# Patient Record
Sex: Female | Born: 1965 | Race: White | Hispanic: No | Marital: Married | Smoking: Former smoker
Health system: Southern US, Community
[De-identification: ages and names within clinical notes are randomized; demographics above are authoritative.]

## PROBLEM LIST (undated history)

## (undated) DIAGNOSIS — M51369 Other intervertebral disc degeneration, lumbar region without mention of lumbar back pain or lower extremity pain: Secondary | ICD-10-CM

## (undated) DIAGNOSIS — G5602 Carpal tunnel syndrome, left upper limb: Secondary | ICD-10-CM

## (undated) DIAGNOSIS — M329 Systemic lupus erythematosus, unspecified: Secondary | ICD-10-CM

## (undated) DIAGNOSIS — M1990 Unspecified osteoarthritis, unspecified site: Secondary | ICD-10-CM

## (undated) DIAGNOSIS — K754 Autoimmune hepatitis: Secondary | ICD-10-CM

## (undated) DIAGNOSIS — M069 Rheumatoid arthritis, unspecified: Secondary | ICD-10-CM

## (undated) DIAGNOSIS — F419 Anxiety disorder, unspecified: Secondary | ICD-10-CM

## (undated) DIAGNOSIS — F329 Major depressive disorder, single episode, unspecified: Secondary | ICD-10-CM

## (undated) DIAGNOSIS — R21 Rash and other nonspecific skin eruption: Secondary | ICD-10-CM

## (undated) DIAGNOSIS — F32A Depression, unspecified: Secondary | ICD-10-CM

## (undated) DIAGNOSIS — M797 Fibromyalgia: Secondary | ICD-10-CM

## (undated) DIAGNOSIS — M5136 Other intervertebral disc degeneration, lumbar region: Secondary | ICD-10-CM

## (undated) DIAGNOSIS — C50919 Malignant neoplasm of unspecified site of unspecified female breast: Secondary | ICD-10-CM

## (undated) DIAGNOSIS — I7300 Raynaud's syndrome without gangrene: Secondary | ICD-10-CM

## (undated) DIAGNOSIS — M3500 Sicca syndrome, unspecified: Secondary | ICD-10-CM

## (undated) HISTORY — DX: Raynaud's syndrome without gangrene: I73.00

## (undated) HISTORY — DX: Malignant neoplasm of unspecified site of unspecified female breast: C50.919

## (undated) HISTORY — DX: Sjogren syndrome, unspecified: M35.00

## (undated) HISTORY — PX: FOOT SURGERY: SHX648

---

## 1987-08-25 HISTORY — PX: CARPAL TUNNEL RELEASE: SHX101

## 1997-08-24 HISTORY — PX: VAGINAL HYSTERECTOMY: SUR661

## 1999-06-20 ENCOUNTER — Ambulatory Visit (HOSPITAL_BASED_OUTPATIENT_CLINIC_OR_DEPARTMENT_OTHER): Admission: RE | Admit: 1999-06-20 | Discharge: 1999-06-20 | Payer: Self-pay | Admitting: Orthopedic Surgery

## 2000-06-03 ENCOUNTER — Encounter: Payer: Self-pay | Admitting: Obstetrics and Gynecology

## 2000-06-03 ENCOUNTER — Encounter: Admission: RE | Admit: 2000-06-03 | Discharge: 2000-06-03 | Payer: Self-pay | Admitting: Obstetrics and Gynecology

## 2000-10-06 ENCOUNTER — Other Ambulatory Visit: Admission: RE | Admit: 2000-10-06 | Discharge: 2000-10-06 | Payer: Self-pay | Admitting: Gastroenterology

## 2000-10-06 ENCOUNTER — Encounter (INDEPENDENT_AMBULATORY_CARE_PROVIDER_SITE_OTHER): Payer: Self-pay | Admitting: Specialist

## 2001-06-07 ENCOUNTER — Encounter: Payer: Self-pay | Admitting: *Deleted

## 2001-06-07 ENCOUNTER — Encounter: Admission: RE | Admit: 2001-06-07 | Discharge: 2001-06-07 | Payer: Self-pay | Admitting: *Deleted

## 2001-11-22 ENCOUNTER — Other Ambulatory Visit: Admission: RE | Admit: 2001-11-22 | Discharge: 2001-11-22 | Payer: Self-pay | Admitting: Obstetrics and Gynecology

## 2002-11-30 ENCOUNTER — Other Ambulatory Visit: Admission: RE | Admit: 2002-11-30 | Discharge: 2002-11-30 | Payer: Self-pay | Admitting: Obstetrics and Gynecology

## 2003-07-16 ENCOUNTER — Encounter: Admission: RE | Admit: 2003-07-16 | Discharge: 2003-07-16 | Payer: Self-pay | Admitting: *Deleted

## 2003-12-09 ENCOUNTER — Encounter: Admission: RE | Admit: 2003-12-09 | Discharge: 2003-12-09 | Payer: Self-pay

## 2004-01-02 ENCOUNTER — Other Ambulatory Visit: Admission: RE | Admit: 2004-01-02 | Discharge: 2004-01-02 | Payer: Self-pay | Admitting: Obstetrics and Gynecology

## 2004-02-06 ENCOUNTER — Inpatient Hospital Stay (HOSPITAL_COMMUNITY): Admission: RE | Admit: 2004-02-06 | Discharge: 2004-02-07 | Payer: Self-pay | Admitting: Neurosurgery

## 2004-02-06 HISTORY — PX: ANTERIOR CERVICAL DECOMP/DISCECTOMY FUSION: SHX1161

## 2005-01-26 ENCOUNTER — Other Ambulatory Visit: Admission: RE | Admit: 2005-01-26 | Discharge: 2005-01-26 | Payer: Self-pay | Admitting: Obstetrics and Gynecology

## 2005-06-16 ENCOUNTER — Ambulatory Visit (HOSPITAL_COMMUNITY): Admission: RE | Admit: 2005-06-16 | Discharge: 2005-06-16 | Payer: Self-pay

## 2007-01-03 ENCOUNTER — Encounter: Admission: RE | Admit: 2007-01-03 | Discharge: 2007-01-03 | Payer: Self-pay | Admitting: Obstetrics and Gynecology

## 2010-04-22 ENCOUNTER — Encounter: Admission: RE | Admit: 2010-04-22 | Discharge: 2010-05-22 | Payer: Self-pay | Admitting: Family Medicine

## 2010-11-13 ENCOUNTER — Encounter: Payer: Self-pay | Admitting: Gastroenterology

## 2010-11-20 NOTE — Letter (Signed)
Summary: Colonoscopy Date Change Letter  [facility]  [address] [place], [place] [address]   Phone: [phone]  Fax: [phone]      November 13, 2010 MRN: [id number]   [facility] [address] [place], [place]  [address]   Dear [name],   Previously you were recommended to have a repeat colonoscopy around this time. Your chart was recently reviewed by Dr. Claudette Head of [facility]. Follow up colonoscopy is now recommended in February 2017. This revised recommendation is based on current, nationally recognized guidelines for colorectal cancer screening and polyp surveillance. These guidelines are endorsed by the American Cancer Society, [employer] on Colorectal Cancer as well as numerous other major medical organizations.  Please understand that our recommendation assumes that you do not have any new symptoms such as bleeding, a change in bowel habits, anemia, or significant abdominal discomfort. If you do have any concerning GI symptoms or want to discuss the guideline recommendations, please call to arrange an office visit at your earliest convenience. Otherwise we will keep you in our reminder system and contact you 1-2 months prior to the date listed above to schedule your next colonoscopy.  Thank you,  Judie Petit T. Russella Dar, M.D.  [facility] [phone]

## 2011-01-09 NOTE — H&P (Signed)
NAME:  Diana Schwartz, Diana Schwartz NO.:  [id number]   MEDICAL RECORD NO.:  [id number]                   PATIENT TYPE:  INP   LOCATION:  NA                                   FACILITY:  [facility]   PHYSICIAN:  Payton Doughty, M.D.                   DATE OF BIRTH:  1965-12-04   DATE OF ADMISSION:  02/06/2004  DATE OF DISCHARGE:                                HISTORY & PHYSICAL   ADMISSION DIAGNOSIS:  Spondylosis C5-6 and C6-7.   BODY OF TEXT:  This is a 45 year old right-handed white female noting over  the past couple of years to have increasing neck pain, the past few months  having increasing pain down the left shoulder and arm. MRI demonstrated  spondylosis at C5-6 and C6-7, she is referred to me. She is now admitted for  an anterior cervical decompression and fusion.   PAST MEDICAL HISTORY:  Remarkable for lupus. She is taking Plaquenil 200 mg  b.i.d., Prevacid 30 mg a day, Flexeril 10 mg a day and on a p.r.n. basis.   ALLERGIES:  She has no allergies.   PAST SURGICAL HISTORY:  Hysterectomy, carpal tunnel, lipectomy, lumpectomy  by Dr. Samuella Cota.   SOCIAL HISTORY:  She smokes a pack of cigarettes a day. She drinks alcohol  on a social basis. She is not working.   FAMILY HISTORY:  Mother is 21 and in fair health. She has emphysema and  stroke. Father is 57 and fair health. He has pancreatitis and lumbar  spondylosis.   REVIEW OF SYSTEMS:  Remarkable for joint pain, arthritis, neck pain,  anxiety, and depression.   PHYSICAL EXAMINATION:  HEENT:  Within normal limits. She has limitation of  range of motion in her neck turning her towards the left.  NECK: Examination of the neck causes pain down her left arm.  CHEST: A few crackles.  CARDIAC: Regular rate and rhythm.  ABDOMEN: Nontender with no hepatosplenomegaly .  EXTREMITIES: Without clubbing, cyanosis, or edema. Peripheral pulses are  good.  GU: Deferred.  NEUROLOGIC: She is awake, alert, and oriented. Cranial  nerves are intact.  Motor exam shows 5/5 strength throughout the upper extremities. Strength in  the left triceps is 4/5. There are dysesthesias she describes in her C6-7  distribution. Reflexes are 2 at the biceps, 1 at the triceps on the left and  2 on the right. Brachial radialis is 1 on both sides. She has a mildly  positive Hoffman's in the left lower extremity and nonmyelopathic.   MRI demonstrates spondylosis, posterior protruding osteophyte, and disk at 5-  6 and a left-sided foraminal narrowing at C6-7.   CLINICAL IMPRESSION:  Cervical spondylosis with myelopathy.   PLAN:  Anterior cervical decompression and fusion at C5-6 and C6-7. The  risks and benefits of this procedure have been discussed with her and she  wishes to proceed.  Payton Doughty, M.D.    MWR/MEDQ  D:  02/06/2004  T:  02/06/2004  Job:  [id number]

## 2011-01-09 NOTE — Op Note (Signed)
NAME:  Diana Schwartz, Diana Schwartz NO.:  [id number]   MEDICAL RECORD NO.:  [id number]                   PATIENT TYPE:  INP   LOCATION:  2872                                 FACILITY:  [facility]   PHYSICIAN:  Payton Doughty, M.D.                   DATE OF BIRTH:  11-Feb-1966   DATE OF PROCEDURE:  02/06/2004  DATE OF DISCHARGE:                                 OPERATIVE REPORT   PREOPERATIVE DIAGNOSIS:  Spondylosis, C5-6, C6-7.   POSTOPERATIVE DIAGNOSIS:  Spondylosis, C5-6, C6-7.   OPERATION PERFORMED:  C5-6, C6-7 anterior cervical decompression and fusion  with reflex hybrid plate.   SURGEON:  Payton Doughty, M.D.   NURSE ASSISTANT:  [facility].   ANESTHESIA:  General endotracheal.   PREP:  Sterile Betadine prep and scrub with alcohol wipe.   COMPLICATIONS:  None.   INDICATIONS FOR PROCEDURE:  The patient is a 45 year old female with left  cervical radiculopathy and cervical spondylosis C5-6 and C6-7.   DESCRIPTION OF PROCEDURE:  The patient was taken to the operating room,  smoothly anesthetized, intubated, and placed supine on the operating table.  Following shave, prep and drape in the usual sterile fashion, the skin was  incised from the midline to the medial border of the sternocleidomastoid  muscle at the level of the carotid tubercle.  The platysma was identified,  elevated, divided and undermined.  The sternocleidomastoid muscle was  identified.  Medial dissection revealed the carotid artery retracted  laterally to the left, trachea and esophagus retracted laterally to the  right.  This exposed the bones of the anterior cervical spine.  Marker was  placed and intraoperative x-ray obtained to confirm correctness of level.  Having confirmed correctness of level, the longus coli was taken down  bilaterally and Shadowline self-retaining retractor placed.  Diskectomy was  then carried out at C5-6 and C6-7 under gross observation.  The operating  microscope was  then brought in and we used microdissection technique to  remove the remaining disk, remove the posterior longitudinal ligament and  dissect the anterior epidural space and explore the neural foramina  bilaterally.  There was disk and spur on the left side at both 5-6 and 6-7.  They were about equally affected.  Following complete decompression, the  wound was irrigated and hemostasis assured.  7 mm bone grafts were fashioned  of patellar allograft and tapped into place.  A 32 mm Reflex hybrid plate  with 12 mm screws was placed.  Two screws in C5, two in C6 and two in C7.  Intraoperative x-ray showed good placement of bone graft, plate and screws.  The wound was irrigated and hemostasis assured.  The platysma was  reapproximated with 3-0 Vicryl in interrupted fashion, subcutaneous tissue  was reapproximated with 3-0 Vicryl in interrupted fashion.  Skin was closed with 4-0 Vicryl in running subcuticular fashion.  Benzoin  and Steri-Strips were placed  and made occlusive with Telfa and OpSite. The  patient was placed in Aspen collar and returned to recovery room in good  condition.                                               Payton Doughty, M.D.    MWR/MEDQ  D:  02/06/2004  T:  02/06/2004  Job:  [id number]

## 2011-08-25 DIAGNOSIS — C50919 Malignant neoplasm of unspecified site of unspecified female breast: Secondary | ICD-10-CM

## 2011-08-25 HISTORY — DX: Malignant neoplasm of unspecified site of unspecified female breast: C50.919

## 2011-12-25 ENCOUNTER — Other Ambulatory Visit: Payer: Self-pay | Admitting: Gastroenterology

## 2011-12-25 DIAGNOSIS — R7989 Other specified abnormal findings of blood chemistry: Secondary | ICD-10-CM

## 2011-12-30 ENCOUNTER — Ambulatory Visit
Admission: RE | Admit: 2011-12-30 | Discharge: 2011-12-30 | Disposition: A | Discharge status: Home / Self Care | Payer: 59 | Source: Ambulatory Visit | Attending: Gastroenterology | Admitting: Gastroenterology

## 2011-12-30 DIAGNOSIS — R7989 Other specified abnormal findings of blood chemistry: Secondary | ICD-10-CM

## 2012-04-18 ENCOUNTER — Other Ambulatory Visit: Payer: Self-pay | Admitting: Obstetrics and Gynecology

## 2012-04-18 DIAGNOSIS — N6489 Other specified disorders of breast: Secondary | ICD-10-CM

## 2012-04-18 DIAGNOSIS — N644 Mastodynia: Secondary | ICD-10-CM

## 2012-04-19 ENCOUNTER — Ambulatory Visit
Admission: RE | Admit: 2012-04-19 | Discharge: 2012-04-19 | Disposition: A | Discharge status: Home / Self Care | Payer: 59 | Source: Ambulatory Visit | Attending: Obstetrics and Gynecology | Admitting: Obstetrics and Gynecology

## 2012-04-19 ENCOUNTER — Other Ambulatory Visit: Payer: Self-pay | Admitting: Obstetrics and Gynecology

## 2012-04-19 DIAGNOSIS — N644 Mastodynia: Secondary | ICD-10-CM

## 2012-04-19 DIAGNOSIS — N6489 Other specified disorders of breast: Secondary | ICD-10-CM

## 2012-08-12 HISTORY — PX: MASTECTOMY: SHX3

## 2012-08-24 HISTORY — PX: AXILLARY NODE DISSECTION: SHX1211

## 2013-06-24 HISTORY — PX: BREAST RECONSTRUCTION: SHX9

## 2013-09-25 ENCOUNTER — Encounter (HOSPITAL_BASED_OUTPATIENT_CLINIC_OR_DEPARTMENT_OTHER): Payer: Self-pay | Admitting: *Deleted

## 2013-09-26 ENCOUNTER — Encounter (HOSPITAL_BASED_OUTPATIENT_CLINIC_OR_DEPARTMENT_OTHER): Payer: Self-pay | Admitting: *Deleted

## 2013-09-27 ENCOUNTER — Encounter (HOSPITAL_BASED_OUTPATIENT_CLINIC_OR_DEPARTMENT_OTHER): Payer: Self-pay | Admitting: *Deleted

## 2013-09-28 ENCOUNTER — Encounter (HOSPITAL_BASED_OUTPATIENT_CLINIC_OR_DEPARTMENT_OTHER): Payer: Self-pay | Admitting: *Deleted

## 2013-09-28 NOTE — Progress Notes (Signed)
NPO AFTER MN. ARRIVE AT 1100. NEEDS HG. WILL TAKE AM MEDS W/ SIPS OF WATER.

## 2013-10-02 ENCOUNTER — Ambulatory Visit (HOSPITAL_BASED_OUTPATIENT_CLINIC_OR_DEPARTMENT_OTHER): Payer: 59 | Admitting: Anesthesiology

## 2013-10-02 ENCOUNTER — Ambulatory Visit (HOSPITAL_BASED_OUTPATIENT_CLINIC_OR_DEPARTMENT_OTHER)
Admission: RE | Admit: 2013-10-02 | Discharge: 2013-10-02 | Disposition: A | Discharge status: Home / Self Care | Payer: 59 | Source: Ambulatory Visit | Attending: Orthopedic Surgery | Admitting: Orthopedic Surgery

## 2013-10-02 ENCOUNTER — Encounter (HOSPITAL_BASED_OUTPATIENT_CLINIC_OR_DEPARTMENT_OTHER): Payer: Self-pay | Admitting: Anesthesiology

## 2013-10-02 ENCOUNTER — Encounter (HOSPITAL_BASED_OUTPATIENT_CLINIC_OR_DEPARTMENT_OTHER)
Admission: RE | Disposition: A | Discharge status: Home / Self Care | Payer: Self-pay | Source: Ambulatory Visit | Attending: Orthopedic Surgery

## 2013-10-02 ENCOUNTER — Encounter (HOSPITAL_BASED_OUTPATIENT_CLINIC_OR_DEPARTMENT_OTHER): Payer: 59 | Admitting: Anesthesiology

## 2013-10-02 DIAGNOSIS — Z885 Allergy status to narcotic agent status: Secondary | ICD-10-CM | POA: Insufficient documentation

## 2013-10-02 DIAGNOSIS — Z9010 Acquired absence of unspecified breast and nipple: Secondary | ICD-10-CM | POA: Insufficient documentation

## 2013-10-02 DIAGNOSIS — K754 Autoimmune hepatitis: Secondary | ICD-10-CM | POA: Insufficient documentation

## 2013-10-02 DIAGNOSIS — IMO0001 Reserved for inherently not codable concepts without codable children: Secondary | ICD-10-CM | POA: Insufficient documentation

## 2013-10-02 DIAGNOSIS — Z79899 Other long term (current) drug therapy: Secondary | ICD-10-CM | POA: Insufficient documentation

## 2013-10-02 DIAGNOSIS — G5600 Carpal tunnel syndrome, unspecified upper limb: Secondary | ICD-10-CM | POA: Insufficient documentation

## 2013-10-02 DIAGNOSIS — Z90710 Acquired absence of both cervix and uterus: Secondary | ICD-10-CM | POA: Insufficient documentation

## 2013-10-02 DIAGNOSIS — Z888 Allergy status to other drugs, medicaments and biological substances status: Secondary | ICD-10-CM | POA: Insufficient documentation

## 2013-10-02 DIAGNOSIS — M069 Rheumatoid arthritis, unspecified: Secondary | ICD-10-CM | POA: Insufficient documentation

## 2013-10-02 DIAGNOSIS — G5602 Carpal tunnel syndrome, left upper limb: Secondary | ICD-10-CM

## 2013-10-02 DIAGNOSIS — Z853 Personal history of malignant neoplasm of breast: Secondary | ICD-10-CM | POA: Insufficient documentation

## 2013-10-02 DIAGNOSIS — M329 Systemic lupus erythematosus, unspecified: Secondary | ICD-10-CM | POA: Insufficient documentation

## 2013-10-02 HISTORY — DX: Depression, unspecified: F32.A

## 2013-10-02 HISTORY — DX: Autoimmune hepatitis: K75.4

## 2013-10-02 HISTORY — DX: Rheumatoid arthritis, unspecified: M06.9

## 2013-10-02 HISTORY — DX: Unspecified osteoarthritis, unspecified site: M19.90

## 2013-10-02 HISTORY — DX: Other intervertebral disc degeneration, lumbar region without mention of lumbar back pain or lower extremity pain: M51.369

## 2013-10-02 HISTORY — PX: CARPAL TUNNEL RELEASE: SHX101

## 2013-10-02 HISTORY — DX: Carpal tunnel syndrome, left upper limb: G56.02

## 2013-10-02 HISTORY — DX: Other intervertebral disc degeneration, lumbar region: M51.36

## 2013-10-02 HISTORY — DX: Rash and other nonspecific skin eruption: R21

## 2013-10-02 HISTORY — DX: Systemic lupus erythematosus, unspecified: M32.9

## 2013-10-02 HISTORY — DX: Fibromyalgia: M79.7

## 2013-10-02 HISTORY — DX: Major depressive disorder, single episode, unspecified: F32.9

## 2013-10-02 LAB — POCT HEMOGLOBIN-HEMACUE: Hemoglobin: 14.4 g/dL (ref 12.0–15.0)

## 2013-10-02 SURGERY — CARPAL TUNNEL RELEASE
Anesthesia: Monitor Anesthesia Care | Site: Wrist | Laterality: Left

## 2013-10-02 SURGERY — CARPAL TUNNEL RELEASE
Anesthesia: Monitor Anesthesia Care | Laterality: Left

## 2013-10-02 MED ORDER — FENTANYL CITRATE 0.05 MG/ML IJ SOLN
INTRAMUSCULAR | Status: DC | PRN
  Administered 2013-10-02 (×2): 25 ug via INTRAVENOUS
  Administered 2013-10-02: 50 ug via INTRAVENOUS

## 2013-10-02 MED ORDER — FENTANYL CITRATE 0.05 MG/ML IJ SOLN
INTRAMUSCULAR | Status: AC
  Filled 2013-10-02: qty 2

## 2013-10-02 MED ORDER — LIDOCAINE HCL (CARDIAC) 20 MG/ML IV SOLN
INTRAVENOUS | Status: DC | PRN
  Administered 2013-10-02: 50 mg via INTRAVENOUS

## 2013-10-02 MED ORDER — ONDANSETRON HCL 4 MG/2ML IJ SOLN
INTRAMUSCULAR | Status: DC | PRN
Start: 2013-10-02 — End: 2013-10-02
  Administered 2013-10-02: 4 mg via INTRAVENOUS

## 2013-10-02 MED ORDER — SODIUM CHLORIDE 0.9 % IR SOLN
Status: DC | PRN
  Administered 2013-10-02: 500 mL

## 2013-10-02 MED ORDER — PROPOFOL 10 MG/ML IV EMUL
INTRAVENOUS | Status: DC | PRN
  Administered 2013-10-02: 100 ug/kg/min via INTRAVENOUS

## 2013-10-02 MED ORDER — CHLORHEXIDINE GLUCONATE 4 % EX LIQD
60.0000 mL | Freq: Once | CUTANEOUS | Status: DC
  Filled 2013-10-02: qty 60

## 2013-10-02 MED ORDER — CEFAZOLIN SODIUM-DEXTROSE 2-3 GM-% IV SOLR
2.0000 g | INTRAVENOUS | Status: AC
  Administered 2013-10-02: 2 g via INTRAVENOUS
  Filled 2013-10-02: qty 50

## 2013-10-02 MED ORDER — MIDAZOLAM HCL 2 MG/2ML IJ SOLN
INTRAMUSCULAR | Status: AC
  Filled 2013-10-02: qty 2

## 2013-10-02 MED ORDER — HYDROCODONE-ACETAMINOPHEN 5-300 MG PO TABS: 1.0000 | ORAL_TABLET | Freq: Four times a day (QID) | ORAL | Status: DC | PRN

## 2013-10-02 MED ORDER — MIDAZOLAM HCL 5 MG/5ML IJ SOLN
INTRAMUSCULAR | Status: DC | PRN
  Administered 2013-10-02: 2 mg via INTRAVENOUS

## 2013-10-02 MED ORDER — DOCUSATE SODIUM 100 MG PO CAPS: 100.0000 mg | ORAL_CAPSULE | Freq: Two times a day (BID) | ORAL | Status: DC

## 2013-10-02 MED ORDER — PROMETHAZINE HCL 25 MG/ML IJ SOLN
6.2500 mg | INTRAMUSCULAR | Status: DC | PRN
  Filled 2013-10-02: qty 1

## 2013-10-02 MED ORDER — FENTANYL CITRATE 0.05 MG/ML IJ SOLN
25.0000 ug | INTRAMUSCULAR | Status: DC | PRN
  Filled 2013-10-02: qty 1

## 2013-10-02 MED ORDER — LACTATED RINGERS IV SOLN
INTRAVENOUS | Status: DC
  Administered 2013-10-02: 12:00:00 via INTRAVENOUS
  Filled 2013-10-02: qty 1000

## 2013-10-02 SURGICAL SUPPLY — 51 items
BANDAGE ELASTIC 3 VELCRO ST LF (GAUZE/BANDAGES/DRESSINGS) ×2 IMPLANT
BANDAGE GAUZE ELAST BULKY 4 IN (GAUZE/BANDAGES/DRESSINGS) ×2 IMPLANT
BLADE SURG 15 STRL LF DISP TIS (BLADE) ×1 IMPLANT
BLADE SURG 15 STRL SS (BLADE) ×1
BNDG COHESIVE 4X5 TAN NS LF (GAUZE/BANDAGES/DRESSINGS) ×2 IMPLANT
BNDG ESMARK 4X9 LF (GAUZE/BANDAGES/DRESSINGS) ×2 IMPLANT
BNDG GAUZE ELAST 4 BULKY (GAUZE/BANDAGES/DRESSINGS) ×2 IMPLANT
CORDS BIPOLAR (ELECTRODE) ×2 IMPLANT
COVER MAYO STAND STRL (DRAPES) IMPLANT
COVER TABLE BACK 60X90 (DRAPES) ×2 IMPLANT
CUFF TOURNIQUET SINGLE 18IN (TOURNIQUET CUFF) ×2 IMPLANT
DRAPE EXTREMITY T 121X128X90 (DRAPE) ×2 IMPLANT
DRAPE SURG 17X23 STRL (DRAPES) ×2 IMPLANT
DRSG EMULSION OIL 3X3 NADH (GAUZE/BANDAGES/DRESSINGS) ×2 IMPLANT
GAUZE SPONGE 4X4 16PLY XRAY LF (GAUZE/BANDAGES/DRESSINGS) IMPLANT
GAUZE XEROFORM 1X8 LF (GAUZE/BANDAGES/DRESSINGS) ×2 IMPLANT
GLOVE BIO SURGEON STRL SZ7 (GLOVE) ×2 IMPLANT
GLOVE BIO SURGEON STRL SZ8 (GLOVE) ×2 IMPLANT
GLOVE BIOGEL PI IND STRL 7.5 (GLOVE) ×1 IMPLANT
GLOVE BIOGEL PI IND STRL 8.5 (GLOVE) ×1 IMPLANT
GLOVE BIOGEL PI INDICATOR 7.5 (GLOVE) ×1
GLOVE BIOGEL PI INDICATOR 8.5 (GLOVE) ×1
GLOVE INDICATOR 7.5 STRL GRN (GLOVE) ×6 IMPLANT
GOWN STRL REUS W/ TWL LRG LVL3 (GOWN DISPOSABLE) IMPLANT
GOWN STRL REUS W/ TWL XL LVL3 (GOWN DISPOSABLE) ×2 IMPLANT
GOWN STRL REUS W/TWL LRG LVL3 (GOWN DISPOSABLE)
GOWN STRL REUS W/TWL XL LVL3 (GOWN DISPOSABLE) ×2
KNIFE CARPAL TUNNEL (BLADE) ×2 IMPLANT
LOOP VESSEL MAXI BLUE (MISCELLANEOUS) IMPLANT
NDL SAFETY ECLIPSE 18X1.5 (NEEDLE) ×2 IMPLANT
NEEDLE HYPO 18GX1.5 SHARP (NEEDLE) ×2
NEEDLE HYPO 25X1 1.5 SAFETY (NEEDLE) ×4 IMPLANT
NS IRRIG 500ML POUR BTL (IV SOLUTION) ×2 IMPLANT
PACK BASIN DAY SURGERY FS (CUSTOM PROCEDURE TRAY) ×2 IMPLANT
PAD ALCOHOL SWAB (MISCELLANEOUS) ×8 IMPLANT
PAD CAST 3X4 CTTN HI CHSV (CAST SUPPLIES) ×1 IMPLANT
PADDING CAST ABS 3INX4YD NS (CAST SUPPLIES)
PADDING CAST ABS 4INX4YD NS (CAST SUPPLIES) ×1
PADDING CAST ABS COTTON 3X4 (CAST SUPPLIES) IMPLANT
PADDING CAST ABS COTTON 4X4 ST (CAST SUPPLIES) ×1 IMPLANT
PADDING CAST COTTON 3X4 STRL (CAST SUPPLIES) ×1
SPONGE GAUZE 4X4 12PLY (GAUZE/BANDAGES/DRESSINGS) ×2 IMPLANT
SPONGE GAUZE 4X4 12PLY STER LF (GAUZE/BANDAGES/DRESSINGS) ×2 IMPLANT
STOCKINETTE 4X48 STRL (DRAPES) ×2 IMPLANT
STOCKINETTE SYNTHETIC 3 UNSTER (CAST SUPPLIES) IMPLANT
SUT PROLENE 4 0 PS 2 18 (SUTURE) ×2 IMPLANT
SYR BULB 3OZ (MISCELLANEOUS) ×2 IMPLANT
SYR CONTROL 10ML LL (SYRINGE) ×4 IMPLANT
TOWEL OR 17X24 6PK STRL BLUE (TOWEL DISPOSABLE) ×2 IMPLANT
TRAY DSU PREP LF (CUSTOM PROCEDURE TRAY) ×2 IMPLANT
UNDERPAD 30X30 INCONTINENT (UNDERPADS AND DIAPERS) ×2 IMPLANT

## 2013-10-02 NOTE — H&P (Signed)
Diana Schwartz is an 48 y.o. female.   Chief Complaint: left hand numbness HPI: pt followed in office Pt with s/s c/w left hand carpal tunnel syndrome Pt elects surgery on left hand Pt with h/o of breast cancer and node dissection on the left arm +emg/ncv studies  Past Medical History  Diagnosis Date  . History of breast cancer NO RECURRENCE    DX 2013  LEFT BREAST CANCER--  S/P BILATERAL MASTECTOMY AND LEFT AXILLA LYMPH NODE DISSECTOMY/  NO CHEMORADIATION  . Osteoarthritis   . Rheumatoid arthritis   . Degenerative disc disease, lumbar   . Autoimmune hepatitis     no current med.  . Rash     arms, chest - due to lupus  . Depression   . Fibromyalgia   . Carpal tunnel syndrome of left wrist   . Lupus (systemic lupus erythematosus)     Past Surgical History  Procedure Laterality Date  . Anterior cervical decomp/discectomy fusion  02/06/2004    C5-6, C6-7  . Breast reconstruction  06/2013  . Carpal tunnel release Right 1989  . Axillary node dissection Left 08/2012  . Vaginal hysterectomy  1999  . Mastectomy Bilateral 08/12/2012    LEFT BREAST CANCER    History reviewed. No pertinent family history. Social History:  reports that she has never smoked. She has never used smokeless tobacco. She reports that she drinks alcohol. She reports that she does not use illicit drugs.  Allergies:  Allergies  Allergen Reactions  . Oxycodone Swelling    Whelps and swelling in throat  . Prednisone Nausea And Vomiting    Can take IV steroids    Medications Prior to Admission  Medication Sig Dispense Refill  . hydroxychloroquine (PLAQUENIL) 200 MG tablet Take 200 mg by mouth 2 (two) times daily.      . tamoxifen (NOLVADEX) 20 MG tablet Take 20 mg by mouth every morning.       . venlafaxine (EFFEXOR) 100 MG tablet Take 225 mg by mouth every morning.         Results for orders placed during the hospital encounter of 10/02/13 (from the past 48 hour(s))  POCT HEMOGLOBIN-HEMACUE      Status: None   Collection Time    10/02/13 11:43 AM      Result Value Range   Hemoglobin 14.4  12.0 - 15.0 g/dL   No results found.  ROS  No recent illnesses or hospitalizations   Blood pressure 103/69, pulse 90, temperature 99.1 F (37.3 C), temperature source Oral, resp. rate 16, height 5\' 2"  (1.575 m), weight 58.968 kg (130 lb), SpO2 100.00%. Physical Exam  General Appearance:  Alert, cooperative, no distress, appears stated age  Head:  Normocephalic, without obvious abnormality, atraumatic  Eyes:  Pupils equal, conjunctiva/corneas clear,         Throat: Lips, mucosa, and tongue normal; teeth and gums normal  Neck: No visible masses     Lungs:   respirations unlabored  Chest Wall:  No tenderness or deformity  Heart:  Regular rate and rhythm,  Abdomen:   Soft, non-tender,         Extremities: Left hand: fingers warm well perfused Good cap refil Good digital motion Full composite flexion and wrist motion  Pulses: 2+ and symmetric  Skin: Skin color, texture, turgor normal, no rashes or lesions     Neurologic: Normal    Assessment/Plan Left hand carpal tunnel syndrome  Left hand carpal tunnel release  R/B/A DISCUSSED WITH PT  IN OFFICE.  PT VOICED UNDERSTANDING OF PLAN CONSENT SIGNED DAY OF SURGERY PT SEEN AND EXAMINED PRIOR TO OPERATIVE PROCEDURE/DAY OF SURGERY SITE MARKED. QUESTIONS ANSWERED WILL GO HOME FOLLOWING SURGERY  Linna Hoff 10/02/2013, 12:14 PM

## 2013-10-02 NOTE — Discharge Instructions (Signed)
KEEP BANDAGE CLEAN AND DRY CALL OFFICE FOR F/U APPT [phone]  DR [facility] CELL PHONE [phone] KEEP HAND ELEVATED ABOVE HEART OK TO APPLY ICE TO OPERATIVE AREA CONTACT OFFICE IF ANY WORSENING PAIN OR CONCERNS. Post Anesthesia Home Care Instructions  Activity: Get plenty of rest for the remainder of the day. A responsible adult should stay with you for 24 hours following the procedure.  For the next 24 hours, DO NOT: -Drive a car -Paediatric nurse -Drink alcoholic beverages -Take any medication unless instructed by your physician -Make any legal decisions or sign important papers.  Meals: Start with liquid foods such as gelatin or soup. Progress to regular foods as tolerated. Avoid greasy, spicy, heavy foods. If nausea and/or vomiting occur, drink only clear liquids until the nausea and/or vomiting subsides. Call your physician if vomiting continues.  Special Instructions/Symptoms: Your throat may feel dry or sore from the anesthesia or the breathing tube placed in your throat during surgery. If this causes discomfort, gargle with warm salt water. The discomfort should disappear within 24 hours. Information for Discharge Teaching: EXPAREL (bupivacaine liposome injectable suspension)   Your surgeon gave you EXPAREL(bupivacaine) in your surgical incision to help control your pain after surgery.   EXPAREL is a local anesthetic that provides pain relief by numbing the tissue around the surgical site.  EXPAREL is designed to release pain medication over time and can control pain for up to 72 hours.  Depending on how you respond to EXPAREL, you may require less pain medication during your recovery.  Possible side effects:  Temporary loss of sensation or ability to move in the area where bupivacaine was injected.  Nausea, vomiting, constipation  Rarely, numbness and tingling in your mouth or lips, lightheadedness, or anxiety may occur.  Call your doctor right away if you think you may  be experiencing any of these sensations, or if you have other questions regarding possible side effects.  Follow all other discharge instructions given to you by your surgeon or nurse. Eat a healthy diet and drink plenty of water or other fluids.  If you return to the hospital for any reason within 96 hours following the administration of EXPAREL, please inform your health care providers.      HAND SURGERY    HOME CARE INSTRUCTIONS    The following instructions have been prepared to help you care for yourself upon your return home today.  Wound Care:  Keep your hand elevated above the level of your heart. Do not allow it to dangle by your side. Keep the dressing dry and do not remove it unless your doctor advises you to do so. He will usually change it at the time of you post-op visit. Moving your fingers is advised to stimulate circulation but will depend on the site of your surgery. Of course, if you have a splint applied your doctor will advise you about movement.  Activity:  Do not drive or operate machinery today. Rest today and then you may return to your normal activity and work as indicated by your physician.  Diet: Drink liquids today or eat a light diet. You may resume a regular diet tomorrow.  General expectations: Pain for two or three days. Fingers may become slightly swollen.   Unexpected Observations- Call your doctor if any of these occur: Severe pain not relieved by pain medication. Elevated temperature. Dressing soaked with blood. Inability to move fingers. White or bluish color to fingers.  Return to office: ***

## 2013-10-02 NOTE — Brief Op Note (Signed)
10/02/2013  12:16 PM  PATIENT:  Carollee Sires Shetterly  48 y.o. female  PRE-OPERATIVE DIAGNOSIS:  left hand carpal tunnel syndrome  POST-OPERATIVE DIAGNOSIS:  * No post-op diagnosis entered *  PROCEDURE:  Procedure(s) with comments: LEFT CARPAL TUNNEL RELEASE (Left) - Local Anesthesia with IV Sedation  SURGEON:  Surgeon(s) and Role:    * Linna Hoff, MD - Primary  PHYSICIAN ASSISTANT:   ASSISTANTS: none   ANESTHESIA:   MAC  EBL:     BLOOD ADMINISTERED:none  DRAINS: none   LOCAL MEDICATIONS USED:  MARCAINE     SPECIMEN:  No Specimen  DISPOSITION OF SPECIMEN:  N/A  COUNTS:  YES  TOURNIQUET:  Less than 15 minutes  [id number]  PLAN OF CARE: Discharge to home after PACU  PATIENT DISPOSITION:  PACU - hemodynamically stable.   Delay start of Pharmacological VTE agent (>24hrs) due to surgical blood loss or risk of bleeding: not applicable

## 2013-10-02 NOTE — Transfer of Care (Signed)
Immediate Anesthesia Transfer of Care Note  Patient: Diana Schwartz  Procedure(s) Performed: Procedure(s) (LRB): LEFT CARPAL TUNNEL RELEASE (Left)  Patient Location: PACU  Anesthesia Type:MAC  Level of Consciousness: awake, alert  and oriented  Airway & Oxygen Therapy: Patient Spontanous Breathing and Patient connected to face mask oxygen  Post-op Assessment: Report given to PACU RN and Post -op Vital signs reviewed and stable  Post vital signs: Reviewed and stable  Complications: No apparent anesthesia complications

## 2013-10-02 NOTE — Anesthesia Postprocedure Evaluation (Signed)
  Anesthesia Post-op Note  Patient: Diana Schwartz  Procedure(s) Performed: Procedure(s) (LRB): LEFT CARPAL TUNNEL RELEASE (Left)  Patient Location: PACU  Anesthesia Type: MAC  Level of Consciousness: awake and alert   Airway and Oxygen Therapy: Patient Spontanous Breathing  Post-op Pain: mild  Post-op Assessment: Post-op Vital signs reviewed, Patient's Cardiovascular Status Stable, Respiratory Function Stable, Patent Airway and No signs of Nausea or vomiting  Last Vitals:  Filed Vitals:   10/02/13 1410  BP: 111/65  Pulse: 67  Temp: 36.1 C  Resp: 18    Post-op Vital Signs: stable   Complications: No apparent anesthesia complications

## 2013-10-02 NOTE — Anesthesia Procedure Notes (Signed)
Procedure Name: MAC Performed by: Mechele Claude Pre-anesthesia Checklist: Patient identified, Timeout performed, Emergency Drugs available, Suction available and Patient being monitored Patient Re-evaluated:Patient Re-evaluated prior to inductionOxygen Delivery Method: Nasal cannula Intubation Type: IV induction Placement Confirmation: positive ETCO2 and breath sounds checked- equal and bilateral

## 2013-10-02 NOTE — Anesthesia Preprocedure Evaluation (Signed)
Anesthesia Evaluation  Patient identified by MRN, date of birth, ID band Patient awake    Reviewed: Allergy & Precautions, H&P , NPO status , Patient's Chart, lab work & pertinent test results  Airway Mallampati: II TM Distance: >3 FB Neck ROM: Full    Dental no notable dental hx.    Pulmonary neg pulmonary ROS,  breath sounds clear to auscultation  Pulmonary exam normal       Cardiovascular Exercise Tolerance: Good negative cardio ROS  Rhythm:Regular Rate:Normal     Neuro/Psych PSYCHIATRIC DISORDERS Depression  Neuromuscular disease    GI/Hepatic negative GI ROS, (+) Hepatitis -, Autoimmune  Endo/Other  negative endocrine ROS  Renal/GU negative Renal ROS  negative genitourinary   Musculoskeletal  (+) Arthritis -, Rheumatoid disorders,  Fibromyalgia -  Abdominal   Peds negative pediatric ROS (+)  Hematology negative hematology ROS (+)   Anesthesia Other Findings   Reproductive/Obstetrics negative OB ROS                           Anesthesia Physical Anesthesia Plan  ASA: II  Anesthesia Plan: MAC   Post-op Pain Management:    Induction: Intravenous  Airway Management Planned:   Additional Equipment:   Intra-op Plan:   Post-operative Plan:   Informed Consent: I have reviewed the patients History and Physical, chart, labs and discussed the procedure including the risks, benefits and alternatives for the proposed anesthesia with the patient or authorized representative who has indicated his/her understanding and acceptance.   Dental advisory given  Plan Discussed with: CRNA  Anesthesia Plan Comments:         Anesthesia Quick Evaluation

## 2013-10-03 ENCOUNTER — Encounter (HOSPITAL_BASED_OUTPATIENT_CLINIC_OR_DEPARTMENT_OTHER): Payer: Self-pay | Admitting: Orthopedic Surgery

## 2013-10-03 NOTE — Op Note (Signed)
Diana Schwartz, Diana Schwartz             ACCOUNT NO.:  [id number]  MEDICAL RECORD NO.:  [id number]  LOCATION:                                 FACILITY:  PHYSICIAN:  Melrose Nakayama, MD  DATE OF BIRTH:  01/28/1966  DATE OF PROCEDURE:  10/02/2013 DATE OF DISCHARGE:  10/02/2013                              OPERATIVE REPORT   PREOPERATIVE DIAGNOSIS:  Left hand carpal tunnel syndrome.  POSTOPERATIVE DIAGNOSIS:  Left hand carpal tunnel syndrome.  SURGICAL PROCEDURE:  Left hand carpal tunnel release.  ATTENDING PHYSICIAN:  Linna Hoff IV, MD, who scrubbed and present for the entire procedure.  ASSISTANT SURGEON:  None.  ANESTHESIA:  Xylocaine 1%, 0.25% Marcaine and local block with IV sedation.  SURGICAL INDICATIONS:  Mrs. [place] Pink is a right-hand-dominant female with signs and symptoms consistent with left hand carpal tunnel syndrome.  The patient elected to undergo the above procedure.  Risks, benefits, and alternatives were discussed in detail with the patient and signed informed consent was obtained.  Risks include, but are not limited to bleeding, infection, damage to nearby nerves, arteries, or tendons, loss of motion of the wrist and digits, incomplete relief of symptoms, and need for further surgical intervention.  DESCRIPTION OF PROCEDURE:  The patient was properly identified in the preoperative holding area and marked with a permanent marker made on the left hand to indicate the correct operative site.  The patient was then brought back to the operating room, placed supine on the anesthesia room table where the IV sedation was administered.  A well-padded tourniquet was placed on the left forearm and sealed with 1000-drape.  Local anesthetic was administered.  Left upper extremity was then prepped and draped in normal sterile fashion.  Time-out was called.  The correct site was identified and procedure was then begun.  Attention was then turned to left hand where  several cm incision made directly in the mid palm.  Dissection was then carried down through the skin and subcutaneous tissue.  The palmar fascia was incised longitudinally under direct visualization, distal one-half of the transverse carpal ligament was released in its entirety.  Further exposure was then carried out proximally.  The remaining portion of the transverse carpal ligament as well as antebrachial fascia were then released.  Careful wound irrigation done throughout.  The wound was then thoroughly irrigated. The tourniquet was then deflated.  There was good hemostasis following tourniquet deflation.  The nerve had good flow through it following release.  It did appear to have the hourglass-type shape to the median nerve during the release.  The wound was then irrigated.  Following this, the skin was then closed using horizontal mattress Prolene sutures.  Adaptic dressing and sterile compressive bandage were then applied.  The patient tolerated the procedure well, returned to the recovery room in good condition.  POSTPROCEDURAL PLAN:  The patient was discharged to home, seen back in the office in approximately 12 days for wound check, suture removal, and begin a postoperative carpal tunnel eval and treat this.     Melrose Nakayama, MD     FWO/MEDQ  D:  10/02/2013  T:  10/03/2013  Job:  [id number]

## 2014-06-09 ENCOUNTER — Telehealth: Payer: Self-pay

## 2014-06-09 NOTE — Telephone Encounter (Signed)
error 

## 2014-09-30 ENCOUNTER — Encounter (HOSPITAL_COMMUNITY): Payer: Self-pay | Admitting: Emergency Medicine

## 2014-09-30 ENCOUNTER — Emergency Department (HOSPITAL_COMMUNITY)
Admission: EM | Admit: 2014-09-30 | Discharge: 2014-09-30 | Disposition: A | Discharge status: Home / Self Care | Payer: 59 | Attending: Emergency Medicine | Admitting: Emergency Medicine

## 2014-09-30 DIAGNOSIS — Z79899 Other long term (current) drug therapy: Secondary | ICD-10-CM | POA: Insufficient documentation

## 2014-09-30 DIAGNOSIS — Z8669 Personal history of other diseases of the nervous system and sense organs: Secondary | ICD-10-CM | POA: Insufficient documentation

## 2014-09-30 DIAGNOSIS — F329 Major depressive disorder, single episode, unspecified: Secondary | ICD-10-CM | POA: Diagnosis not present

## 2014-09-30 DIAGNOSIS — D8989 Other specified disorders involving the immune mechanism, not elsewhere classified: Secondary | ICD-10-CM | POA: Diagnosis not present

## 2014-09-30 DIAGNOSIS — M549 Dorsalgia, unspecified: Secondary | ICD-10-CM | POA: Diagnosis present

## 2014-09-30 DIAGNOSIS — M797 Fibromyalgia: Secondary | ICD-10-CM | POA: Insufficient documentation

## 2014-09-30 DIAGNOSIS — M545 Low back pain: Secondary | ICD-10-CM | POA: Diagnosis not present

## 2014-09-30 DIAGNOSIS — M069 Rheumatoid arthritis, unspecified: Secondary | ICD-10-CM | POA: Diagnosis not present

## 2014-09-30 DIAGNOSIS — Z8709 Personal history of other diseases of the respiratory system: Secondary | ICD-10-CM | POA: Insufficient documentation

## 2014-09-30 DIAGNOSIS — M1990 Unspecified osteoarthritis, unspecified site: Secondary | ICD-10-CM | POA: Insufficient documentation

## 2014-09-30 DIAGNOSIS — Z853 Personal history of malignant neoplasm of breast: Secondary | ICD-10-CM | POA: Diagnosis not present

## 2014-09-30 LAB — URINALYSIS, ROUTINE W REFLEX MICROSCOPIC
Bilirubin Urine: NEGATIVE
Glucose, UA: NEGATIVE mg/dL
Hgb urine dipstick: NEGATIVE
KETONES UR: NEGATIVE mg/dL
Leukocytes, UA: NEGATIVE
Nitrite: NEGATIVE
PH: 6 (ref 5.0–8.0)
Protein, ur: NEGATIVE mg/dL
SPECIFIC GRAVITY, URINE: 1.015 (ref 1.005–1.030)
Urobilinogen, UA: 0.2 mg/dL (ref 0.0–1.0)

## 2014-09-30 MED ORDER — HYDROCODONE-ACETAMINOPHEN 5-325 MG PO TABS
1.0000 | ORAL_TABLET | Freq: Once | ORAL | Status: AC
Start: 2014-09-30 — End: 2014-09-30
  Administered 2014-09-30: 1 via ORAL
  Filled 2014-09-30: qty 1

## 2014-09-30 MED ORDER — METHOCARBAMOL 500 MG PO TABS: 500.0000 mg | ORAL_TABLET | Freq: Two times a day (BID) | ORAL | Status: DC

## 2014-09-30 MED ORDER — HYDROMORPHONE HCL 1 MG/ML IJ SOLN
1.0000 mg | Freq: Once | INTRAMUSCULAR | Status: AC
  Administered 2014-09-30: 1 mg via INTRAVENOUS
  Filled 2014-09-30: qty 1

## 2014-09-30 MED ORDER — DIAZEPAM 5 MG/ML IJ SOLN
10.0000 mg | Freq: Once | INTRAMUSCULAR | Status: AC
  Administered 2014-09-30: 10 mg via INTRAVENOUS
  Filled 2014-09-30: qty 2

## 2014-09-30 MED ORDER — IBUPROFEN 400 MG PO TABS: 400.0000 mg | ORAL_TABLET | Freq: Four times a day (QID) | ORAL | Status: DC | PRN

## 2014-09-30 MED ORDER — KETOROLAC TROMETHAMINE 30 MG/ML IJ SOLN
30.0000 mg | Freq: Once | INTRAMUSCULAR | Status: AC
  Administered 2014-09-30: 30 mg via INTRAVENOUS
  Filled 2014-09-30: qty 1

## 2014-09-30 MED ORDER — PREDNISONE 50 MG PO TABS: 50.0000 mg | ORAL_TABLET | Freq: Every day | ORAL | Status: DC

## 2014-09-30 MED ORDER — DEXAMETHASONE SODIUM PHOSPHATE 10 MG/ML IJ SOLN
10.0000 mg | Freq: Once | INTRAMUSCULAR | Status: AC
  Administered 2014-09-30: 10 mg via INTRAVENOUS
  Filled 2014-09-30: qty 1

## 2014-09-30 NOTE — ED Notes (Signed)
Patient ambulating with assistance to restroom to obtain urine specimen

## 2014-09-30 NOTE — ED Notes (Signed)
Bed: FB90 Expected date: 09/30/14 Expected time: 12:30 PM Means of arrival: Ambulance Comments: Low back pain

## 2014-09-30 NOTE — Discharge Instructions (Signed)
Take the meds prescribed. Continue taking the norco you take at home. See the Rheumatologist soon. Return to the ER if the pain is getting worse, there is new numbness, weakness, urinary incontinence, urinary retention, bowel incontinence, tingling around the groin region.   Lupus Lupus (also called systemic lupus erythematosus, SLE) is a disorder of the body's natural defense system (immune system). In lupus, the immune system attacks various areas of the body (autoimmune disease). CAUSES The cause is unknown. However, lupus runs in families. Certain genes can make you more likely to develop lupus. It is 10 times more common in women than in men. Lupus is also more common in African Americans and Asians. Other factors also play a role, such as viruses (Epstein-Barr virus, EBV), stress, hormones, cigarette smoke, and certain drugs. SYMPTOMS Lupus can affect many parts of the body, including the joints, skin, kidneys, lungs, heart, nervous system, and blood vessels. The signs and symptoms of lupus differ from person to person. The disease can range from mild to life-threatening. Typical features of lupus include:  Butterfly-shaped rash over the face.  Arthritis involving one or more joints.  Kidney disease.  Fever, weight loss, hair loss, fatigue.  Poor circulation in the fingers and toes (Raynaud's disease).  Chest pain when taking deep breaths. Abdominal pain may also occur.  Skin rash in areas exposed to the sun.  Sores in the mouth and nose. DIAGNOSIS Diagnosing lupus can take a long time and is often difficult. An exam and an accurate account of your symptoms and health problems is very important. Blood tests are necessary, though no single test can confirm or rule out lupus. Most people with lupus test positive for antinuclear antibodies (ANA) on a blood test. Additional blood tests, a urine test (urinalysis), and sometimes a kidney or skin tissue sample (biopsy) can help to confirm  or rule out lupus. TREATMENT There is no cure for lupus. Your caregiver will develop a treatment plan based on your age, sex, health, symptoms, and lifestyle. The goals are to prevent flares, to treat them when they do occur, and to minimize organ damage and complications. How the disease may affect each person varies widely. Most people with lupus can live normal lives, but this disorder must be carefully monitored. Treatment must be adjusted as necessary to prevent serious complications. Medicines used for treatment:  Nonsteroidal anti-inflammatory drugs (NSAIDs) decrease inflammation and can help with chest pain, joint pain, and fevers. Examples include ibuprofen and naproxen.  Antimalarial drugs were designed to treat malaria. They also treat fatigue, joint pain, skin rashes, and inflammation of the lungs in patients with lupus.  Corticosteroids are powerful hormones that rapidly suppress inflammation. The lowest dose with the highest benefit will be chosen. They can be given by cream, pills, injections, and through the vein (intravenously).  Immunosuppressive drugs block the making of immune cells. They may be used for kidney or nerve disease. HOME CARE INSTRUCTIONS  Exercise. Low-impact activities can usually help keep joints flexible without being too strenuous.  Rest after periods of exercise.  Avoid excessive sun exposure.  Follow proper nutrition and take supplements as recommended by your caregiver.  Stress management can be helpful. SEEK MEDICAL CARE IF:  You have increased fatigue.  You develop pain.  You develop a rash.  You have an oral temperature above 102 F (38.9 C).  You develop abdominal discomfort.  You develop a headache.  You experience dizziness. [place] of Neurological Disorders and Stroke: MasterBoxes.it  [employer] of Rheumatology: www.rheumatology.[facility] of Arthritis and Musculoskeletal and  Skin Diseases: www.niams.SouthExposed.es Document Released: 07/31/2002 Document Revised: 11/02/2011 Document Reviewed: 11/21/2009 [facility] Patient Information 2015 [place], [place]. This information is not intended to replace advice given to you by your health care provider. Make sure you discuss any questions you have with your health care provider. Sjogren Syndrome Sjogren syndrome is a disease in which the body's natural defense system (immune system) turns against the body's own cells and attacks the glands that produce tears and saliva. Sjogren syndrome is sometimes linked to rheumatic disorders, such as rheumatoid arthritis and lupus. It is 10 times more common in women than in men. Most people with Sjogren syndrome are from 64 to 35 years of age. CAUSES  The cause is unknown. It runs in families. It is possible that a trigger, such as a viral infection, can set it off. SYMPTOMS  The main symptoms are:  Dry mouth.  Chalky feeling, mouth feels like it is full of cotton.  Difficulty swallowing, speaking, or tasting.  Prone to cavities and mouth infections.  Dry eyes.  Burning, itching, feels like sand in the eyes.  Blurry vision.  Light sensitive. Other symptoms may include:  Skin, nose, and vaginal dryness.  Joint pain, stiffness, and muscle pain. Other organs that may be affected include:  Kidneys.  Blood vessels.  Lungs.  Liver.  Pancreas.  Brain. DIAGNOSIS  Diagnosis is first based on symptoms, medical history, and physical exam. Tests may also be done, including:  Tear production test (Schirmer test).  A thorough eye exam using a magnifying device (slit-lamp exam).  Test to see the extent of eye damage using dye staining.  Mouth exam to look for signs of salivary gland swelling and mouth dryness.  Removal of a minor salivary gland from inside the lower lip to be studied under a microscope (lip biopsy).  Blood tests. Routine and special blood studies will be  checked. Antibodies that attack normal tissue (autoantibodies) may be present, such as antinuclear antibodies (ANAs), rheumatoid factors, and Sjogren antibodies (anti-SSA and anti-SSB).  Chest X-ray.  Urine tests (urinalysis). TREATMENT  There is no known cure for this syndrome. There is no specific treatment to restore gland secretion, either. Treatment varies and is generally based upon the problems present.  Moisture replacement therapies may ease the symptoms of dryness.  Nonsteroidal anti-inflammatory drugs (NSAIDs), such as ibuprofen, may be used to treat musculoskeletal symptoms.  Corticosteroids, such as prednisone, may be given for people with severe complications.  Immunosuppressive drugs may be prescribed to control overactivity of the immune system. In severe cases, this overactivity can lead to organ damage.  A surgical procedure called punctal occlusion may be considered. This is done to close the tear ducts, helping to keep more natural tears on the eye's surface. A small percentage of people with Sjogren syndrome develop lymphoma. If you are worried that you might develop lymphoma, talk to your caregiver to learn more about the disease and the symptoms to watch. HOME CARE INSTRUCTIONS   Eye care:  Use eye drops as specified by your caregiver.  Try to blink 5 to 6 times per minute.  Protect your eyes from drafts and breezes.  Maintain properly humidified air.  Avoid smoke.  Mouth care:  Sugar-free gum and hard candy can help in some people.  Take frequent sips of water or sugar-free drinks.  Use lip balm, saliva substitutes, and prescription medicines as directed. SEEK MEDICAL CARE IF:  You have an oral temperature above 102 F (38.9 C).  You have night sweats.  You develop constant fatigue.  You have unexplained weight loss.  You develop itchy skin.  You have reddened patches on the skin. FOR MORE INFORMATION  Sjogren's Syndrome Foundation:  www.sjogrens.[facility] of Arthritis and Musculoskeletal and Skin Diseases: www.niams.SouthExposed.es Document Released: 07/31/2002 Document Revised: 12/25/2013 Document Reviewed: 12/16/2009 [facility] Patient Information 2015 [place], [place]. This information is not intended to replace advice given to you by your health care provider. Make sure you discuss any questions you have with your health care provider.

## 2014-09-30 NOTE — ED Notes (Addendum)
Per EMS, Pt c/o low back pain x 2 hours, starting at 10 am this morning. Pt has hx of fibromyalgia and lupus. Pt sts she was experiencing some "twinges" then the pain became severe, constant and unbearable. Pt took 2 hydrocodone at 10:30 am and pain went from 10 to an 8. Pt believes she is having muscle spasms. A&Ox4.

## 2014-10-02 NOTE — ED Provider Notes (Signed)
CSN: [id number]     Arrival date & time 09/30/14  1242 History   First MD Initiated Contact with Patient 09/30/14 1335     Chief Complaint  Patient presents with  . Back Pain     (Consider location/radiation/quality/duration/timing/severity/associated sxs/prior Treatment) HPI Comments: PT comes in to the ER with severe back pain. Pt has hx of sjorgens, SLE, Raynaud's, fibromyalgia. She has hx of back pain, but the current pain is severe. PT started this AM, sudden onset. Pain is located in the back, and is worse with laying flat, and with walking. No associated numbness, weakness, urinary incontinence, urinary retention, bowel incontinence, saddle anesthesia. Pt is not on any immunomodulators or steroids for her autoimmune conditions. No trauma.    Patient is a 49 y.o. female presenting with back pain. The history is provided by the patient and a relative.  Back Pain Associated symptoms: numbness   Associated symptoms: no abdominal pain, no chest pain, no dysuria and no headaches     Past Medical History  Diagnosis Date  . History of breast cancer NO RECURRENCE    DX 2013  LEFT BREAST CANCER--  S/P BILATERAL MASTECTOMY AND LEFT AXILLA LYMPH NODE DISSECTOMY/  NO CHEMORADIATION  . Osteoarthritis   . Rheumatoid arthritis   . Degenerative disc disease, lumbar   . Autoimmune hepatitis     no current med.  . Rash     arms, chest - due to lupus  . Depression   . Fibromyalgia   . Carpal tunnel syndrome of left wrist   . Lupus (systemic lupus erythematosus)    Past Surgical History  Procedure Laterality Date  . Anterior cervical decomp/discectomy fusion  02/06/2004    C5-6, C6-7  . Breast reconstruction  06/2013  . Carpal tunnel release Right 1989  . Axillary node dissection Left 08/2012  . Vaginal hysterectomy  1999  . Mastectomy Bilateral 08/12/2012    LEFT BREAST CANCER  . Carpal tunnel release Left 10/02/2013    Procedure: LEFT CARPAL TUNNEL RELEASE;  Surgeon: Linna Hoff,  MD;  Location: [facility];  Service: Orthopedics;  Laterality: Left;  Local Anesthesia with IV Sedation   No family history on file. History  Substance Use Topics  . Smoking status: Never Smoker   . Smokeless tobacco: Never Used  . Alcohol Use: Yes     Comment: socially   OB History    No data available     Review of Systems  Constitutional: Positive for activity change.  Respiratory: Negative for shortness of breath.   Cardiovascular: Negative for chest pain.  Gastrointestinal: Negative for nausea, vomiting and abdominal pain.  Genitourinary: Negative for dysuria, flank pain and decreased urine volume.  Musculoskeletal: Positive for back pain. Negative for neck pain.  Neurological: Positive for numbness. Negative for headaches.      Allergies  Oxycodone and Prednisone  Home Medications   Prior to Admission medications   Medication Sig Start Date End Date Taking? Authorizing Provider  azaTHIOprine (IMURAN) 50 MG tablet Take 50 mg by mouth 2 (two) times daily.   Yes Historical Provider, MD  HYDROcodone-acetaminophen (NORCO/VICODIN) 5-325 MG per tablet Take 1 tablet by mouth every 6 (six) hours as needed for severe pain.   Yes Historical Provider, MD  hydroxychloroquine (PLAQUENIL) 200 MG tablet Take 200 mg by mouth 2 (two) times daily.   Yes Historical Provider, MD  tamoxifen (NOLVADEX) 20 MG tablet Take 20 mg by mouth every morning.    Yes Historical  Provider, MD  traMADol (ULTRAM) 50 MG tablet Take 50 mg by mouth every 6 (six) hours as needed for moderate pain.   Yes Historical Provider, MD  venlafaxine (EFFEXOR) 75 MG tablet Take 75 mg by mouth 3 (three) times daily with meals.   Yes Historical Provider, MD  docusate sodium (COLACE) 100 MG capsule Take 1 capsule (100 mg total) by mouth 2 (two) times daily. Patient not taking: Reported on 09/30/2014 10/02/13   Linna Hoff, MD  Hydrocodone-Acetaminophen (VICODIN) 5-300 MG TABS Take 1 tablet by mouth 4 (four)  times daily as needed (PAIN). Patient not taking: Reported on 09/30/2014 10/02/13   Linna Hoff, MD  ibuprofen (ADVIL,MOTRIN) 400 MG tablet Take 1 tablet (400 mg total) by mouth every 6 (six) hours as needed. 09/30/14   Varney Biles, MD  methocarbamol (ROBAXIN) 500 MG tablet Take 1 tablet (500 mg total) by mouth 2 (two) times daily. 09/30/14   Varney Biles, MD  predniSONE (DELTASONE) 50 MG tablet Take 1 tablet (50 mg total) by mouth daily. 09/30/14   Maira Christon Kathrynn Humble, MD   BP 96/53 mmHg  Pulse 68  Temp(Src) 98.2 F (36.8 C) (Oral)  Resp 18  SpO2 100% Physical Exam  Constitutional: She is oriented to person, place, and time. She appears well-developed and well-nourished.  HENT:  Head: Normocephalic and atraumatic.  Eyes: EOM are normal. Pupils are equal, round, and reactive to light.  Neck: Neck supple.  Cardiovascular: Normal rate, regular rhythm and normal heart sounds.   No murmur heard. Pulmonary/Chest: Effort normal. No respiratory distress.  Abdominal: Soft. She exhibits no distension. There is no tenderness. There is no rebound and no guarding.  Musculoskeletal:  Pt has tenderness over the lumbar region No step offs, no erythema. Pt has 2+ patellar reflex bilaterally. Able to discriminate between sharp and dull. Able to ambulate (with assistance).   Neurological: She is alert and oriented to person, place, and time.  Skin: Skin is warm and dry.  Nursing note and vitals reviewed.   ED Course  Procedures (including critical care time) Labs Review Labs Reviewed  URINALYSIS, ROUTINE W REFLEX MICROSCOPIC - Abnormal; Notable for the following:    APPearance CLOUDY (*)    All other components within normal limits    Imaging Review No results found.   EKG Interpretation None      MDM   Final diagnoses:  Severe back pain  Inflammatory autoimmune disorder    PT with severe back pain. She has hx of autoimmune condition and connective tissue dz. No hx of similar back  pain. She has no red flags on history or exam to suggest a spinal cord involvement, or even radicular symptoms. It is highly likely that she is having a flair up of one of her autoimmune condition.  Pt given iv analegsics, muscle relaxants and antiinflammatory. Pt reassessed. Pain improved, and tolerable. She has norco at home, we will start short course of antiinflammatory. Pt advised to see her Rheumatologist soon. Return precautions to the ER discussed.   Varney Biles, MD 10/02/14 1114

## 2014-11-26 ENCOUNTER — Other Ambulatory Visit: Payer: Self-pay | Admitting: Obstetrics and Gynecology

## 2014-11-27 LAB — CYTOLOGY - PAP

## 2015-05-10 ENCOUNTER — Other Ambulatory Visit: Payer: Self-pay | Admitting: Neurosurgery

## 2015-05-22 ENCOUNTER — Encounter (HOSPITAL_COMMUNITY)
Admission: RE | Admit: 2015-05-22 | Discharge: 2015-05-22 | Disposition: A | Discharge status: Home / Self Care | Payer: 59 | Source: Ambulatory Visit | Attending: Neurosurgery | Admitting: Neurosurgery

## 2015-05-22 ENCOUNTER — Encounter (HOSPITAL_COMMUNITY): Payer: Self-pay

## 2015-05-22 DIAGNOSIS — M4726 Other spondylosis with radiculopathy, lumbar region: Secondary | ICD-10-CM | POA: Diagnosis not present

## 2015-05-22 DIAGNOSIS — Z01812 Encounter for preprocedural laboratory examination: Secondary | ICD-10-CM | POA: Diagnosis not present

## 2015-05-22 DIAGNOSIS — Z0183 Encounter for blood typing: Secondary | ICD-10-CM | POA: Diagnosis not present

## 2015-05-22 HISTORY — DX: Anxiety disorder, unspecified: F41.9

## 2015-05-22 LAB — BASIC METABOLIC PANEL
Anion gap: 6 (ref 5–15)
BUN: 8 mg/dL (ref 6–20)
CHLORIDE: 105 mmol/L (ref 101–111)
CO2: 27 mmol/L (ref 22–32)
CREATININE: 0.73 mg/dL (ref 0.44–1.00)
Calcium: 9.1 mg/dL (ref 8.9–10.3)
GFR calc Af Amer: >60 mL/min (ref >60)
GFR calc non Af Amer: >60 mL/min (ref >60)
GLUCOSE: 92 mg/dL (ref 65–99)
Potassium: 4.3 mmol/L (ref 3.5–5.1)
SODIUM: 138 mmol/L (ref 135–145)

## 2015-05-22 LAB — CBC
HCT: 41.1 % (ref 36.0–46.0)
HEMOGLOBIN: 14 g/dL (ref 12.0–15.0)
MCH: 35.9 pg — AB (ref 26.0–34.0)
MCHC: 34.1 g/dL (ref 30.0–36.0)
MCV: 105.4 fL — AB (ref 78.0–100.0)
Platelets: 206 10*3/uL (ref 150–400)
RBC: 3.9 MIL/uL (ref 3.87–5.11)
RDW: 13.3 % (ref 11.5–15.5)
WBC: 3.9 10*3/uL — ABNORMAL LOW (ref 4.0–10.5)

## 2015-05-22 LAB — SURGICAL PCR SCREEN
MRSA, PCR: NEGATIVE
Staphylococcus aureus: NEGATIVE

## 2015-05-22 LAB — TYPE AND SCREEN
ABO/RH(D): O POS
Antibody Screen: NEGATIVE

## 2015-05-22 LAB — ABO/RH: ABO/RH(D): O POS

## 2015-05-22 NOTE — Pre-Procedure Instructions (Signed)
Diana Schwartz  05/22/2015      [id number] [place] [address] [place] [address]Phone: [phone]: [phone]   Your procedure is scheduled on  Oct 6.  [facility]  Call this number if you have problems the morning of surgery:  [phone]   Remember:  Do not eat food or drink liquids after midnight.  Take these medicines the morning of surgery with A SIP OF WATER Tamoxifen (Nolvadex), Venlafaxine XR (Effexor-XR)  Stop taking Aspirin, Ibuprofen, BC's, Goody's, Herbal medications, Fish Oil, ALeve   Do not wear jewelry, make-up or nail polish.  Do not wear lotions, powders, or perfumes.  You may wear deodorant.  Do not shave 48 hours prior to surgery.  Men may shave face and neck.  Do not bring valuables to the hospital.  [facility] or [facility].  Contacts, dentures or bridgework may not be worn into surgery.  Leave your suitcase in the car.  After surgery it may be brought to your room.  For patients admitted to the hospital, discharge time will be determined by your treatment team.  Patients discharged the day of surgery will not be allowed to drive home.    Special instructions:  [facility] - Preparing for Surgery  Before surgery, you can play an important role.  Because skin is not sterile, your skin needs to be as free of germs as possible.  You can reduce the number of germs on you skin by washing with CHG (chlorahexidine gluconate) soap before surgery.  CHG is an antiseptic cleaner which kills germs and bonds with the skin to continue killing germs even after washing.  Please DO NOT use if you have an allergy to CHG or antibacterial soaps.  If your skin becomes reddened/irritated stop using the CHG and inform your nurse when you arrive at Short Stay.  Do not shave (including legs and underarms) for at least 48 hours prior to the first  CHG shower.  You may shave your face.  Please follow these instructions carefully:   1.  Shower with CHG Soap the night before surgery and the   morning of Surgery.  2.  If you choose to wash your hair, wash your hair first as usual with your   normal shampoo.  3.  After you shampoo, rinse your hair and body thoroughly to remove the  Shampoo.  4.  Use CHG as you would any other liquid soap.  You can apply chg directly to the skin and wash gently with scrungie or a clean washcloth.  5.  Apply the CHG Soap to your body ONLY FROM THE NECK DOWN.   Do not use on open wounds or open sores.  Avoid contact with your eyes,  ears, mouth and genitals (private parts).  Wash genitals (private parts) with your normal soap.  6.  Wash thoroughly, paying special attention to the area where your surgery  will be performed.  7.  Thoroughly rinse your body with warm water from the neck down.  8.  DO NOT shower/wash with your normal soap after using and rinsing off the CHG Soap.  9.  Pat yourself dry with a clean towel.            10.  Wear clean pajamas.            11.  Place clean sheets on  your bed the night of your first shower and do not sleep with pets.  Day of Surgery  Do not apply any lotions/deoderants the morning of surgery.  Please wear clean clothes to the hospital/surgery center.     Please read over the following fact sheets that you were given. Pain Booklet, Coughing and Deep Breathing, Blood Transfusion Information, MRSA Information and Surgical Site Infection Prevention

## 2015-05-22 NOTE — Progress Notes (Signed)
PCP is Dr Harrington Challenger Denies seeing a cardiologist. Denies ever having a card cath, stress test, or echo.

## 2015-05-25 HISTORY — PX: OTHER SURGICAL HISTORY: SHX169

## 2015-05-29 MED ORDER — CEFAZOLIN SODIUM-DEXTROSE 2-3 GM-% IV SOLR
2.0000 g | INTRAVENOUS | Status: AC
  Administered 2015-05-30: 2 g via INTRAVENOUS

## 2015-05-30 ENCOUNTER — Inpatient Hospital Stay (HOSPITAL_COMMUNITY): Payer: 59 | Admitting: Anesthesiology

## 2015-05-30 ENCOUNTER — Inpatient Hospital Stay (HOSPITAL_COMMUNITY)
Admission: RE | Admit: 2015-05-30 | Discharge: 2015-05-31 | DRG: 460 | Disposition: A | Discharge status: Home / Self Care | Payer: 59 | Source: Ambulatory Visit | Attending: Neurosurgery | Admitting: Neurosurgery

## 2015-05-30 ENCOUNTER — Encounter (HOSPITAL_COMMUNITY)
Admission: RE | Disposition: A | Discharge status: Home / Self Care | Payer: 59 | Source: Ambulatory Visit | Attending: Neurosurgery

## 2015-05-30 ENCOUNTER — Encounter (HOSPITAL_COMMUNITY): Payer: Self-pay | Admitting: *Deleted

## 2015-05-30 ENCOUNTER — Inpatient Hospital Stay (HOSPITAL_COMMUNITY): Payer: 59

## 2015-05-30 DIAGNOSIS — M47816 Spondylosis without myelopathy or radiculopathy, lumbar region: Secondary | ICD-10-CM | POA: Diagnosis present

## 2015-05-30 DIAGNOSIS — M797 Fibromyalgia: Secondary | ICD-10-CM | POA: Diagnosis present

## 2015-05-30 DIAGNOSIS — M5116 Intervertebral disc disorders with radiculopathy, lumbar region: Secondary | ICD-10-CM | POA: Diagnosis present

## 2015-05-30 DIAGNOSIS — Z853 Personal history of malignant neoplasm of breast: Secondary | ICD-10-CM | POA: Diagnosis not present

## 2015-05-30 DIAGNOSIS — K754 Autoimmune hepatitis: Secondary | ICD-10-CM | POA: Diagnosis present

## 2015-05-30 DIAGNOSIS — M532X6 Spinal instabilities, lumbar region: Secondary | ICD-10-CM | POA: Diagnosis present

## 2015-05-30 DIAGNOSIS — Z419 Encounter for procedure for purposes other than remedying health state, unspecified: Secondary | ICD-10-CM

## 2015-05-30 DIAGNOSIS — Z9013 Acquired absence of bilateral breasts and nipples: Secondary | ICD-10-CM | POA: Diagnosis not present

## 2015-05-30 DIAGNOSIS — M4726 Other spondylosis with radiculopathy, lumbar region: Principal | ICD-10-CM | POA: Diagnosis present

## 2015-05-30 DIAGNOSIS — Z885 Allergy status to narcotic agent status: Secondary | ICD-10-CM | POA: Diagnosis not present

## 2015-05-30 DIAGNOSIS — Z888 Allergy status to other drugs, medicaments and biological substances status: Secondary | ICD-10-CM | POA: Diagnosis not present

## 2015-05-30 DIAGNOSIS — M069 Rheumatoid arthritis, unspecified: Secondary | ICD-10-CM | POA: Diagnosis present

## 2015-05-30 DIAGNOSIS — M329 Systemic lupus erythematosus, unspecified: Secondary | ICD-10-CM | POA: Diagnosis present

## 2015-05-30 DIAGNOSIS — M545 Low back pain: Secondary | ICD-10-CM | POA: Diagnosis present

## 2015-05-30 DIAGNOSIS — Z90710 Acquired absence of both cervix and uterus: Secondary | ICD-10-CM

## 2015-05-30 SURGERY — POSTERIOR LUMBAR FUSION 1 LEVEL
Anesthesia: General | Site: Back

## 2015-05-30 MED ORDER — VENLAFAXINE HCL ER 75 MG PO CP24
225.0000 mg | ORAL_CAPSULE | Freq: Every day | ORAL | Status: DC
  Filled 2015-05-30: qty 1

## 2015-05-30 MED ORDER — EPHEDRINE SULFATE 50 MG/ML IJ SOLN
INTRAMUSCULAR | Status: AC
  Filled 2015-05-30: qty 1

## 2015-05-30 MED ORDER — MIDAZOLAM HCL 5 MG/5ML IJ SOLN
INTRAMUSCULAR | Status: DC | PRN
  Administered 2015-05-30: 2 mg via INTRAVENOUS

## 2015-05-30 MED ORDER — BISACODYL 10 MG RE SUPP: 10.0000 mg | Freq: Every day | RECTAL | Status: DC | PRN

## 2015-05-30 MED ORDER — PHENOL 1.4 % MT LIQD: 1.0000 | OROMUCOSAL | Status: DC | PRN

## 2015-05-30 MED ORDER — BACITRACIN 50000 UNITS IM SOLR
INTRAMUSCULAR | Status: DC | PRN
  Administered 2015-05-30: 14:00:00

## 2015-05-30 MED ORDER — KETOROLAC TROMETHAMINE 30 MG/ML IJ SOLN
30.0000 mg | Freq: Once | INTRAMUSCULAR | Status: AC
Start: 2015-05-30 — End: 2015-05-30
  Administered 2015-05-30: 30 mg via INTRAVENOUS

## 2015-05-30 MED ORDER — CEFAZOLIN SODIUM-DEXTROSE 2-3 GM-% IV SOLR
INTRAVENOUS | Status: AC
  Filled 2015-05-30: qty 50

## 2015-05-30 MED ORDER — MORPHINE SULFATE (PF) 4 MG/ML IV SOLN: 4.0000 mg | INTRAVENOUS | Status: DC | PRN

## 2015-05-30 MED ORDER — ACETAMINOPHEN 10 MG/ML IV SOLN
INTRAVENOUS | Status: AC
  Administered 2015-05-30: 1000 mg via INTRAVENOUS
  Filled 2015-05-30: qty 100

## 2015-05-30 MED ORDER — FENTANYL CITRATE (PF) 100 MCG/2ML IJ SOLN
INTRAMUSCULAR | Status: DC | PRN
  Administered 2015-05-30 (×4): 50 ug via INTRAVENOUS
  Administered 2015-05-30: 150 ug via INTRAVENOUS

## 2015-05-30 MED ORDER — FENTANYL CITRATE (PF) 250 MCG/5ML IJ SOLN
INTRAMUSCULAR | Status: AC
  Filled 2015-05-30: qty 5

## 2015-05-30 MED ORDER — TAMOXIFEN CITRATE 10 MG PO TABS
20.0000 mg | ORAL_TABLET | Freq: Every day | ORAL | Status: DC
  Filled 2015-05-30: qty 2

## 2015-05-30 MED ORDER — HYDROXYCHLOROQUINE SULFATE 200 MG PO TABS
200.0000 mg | ORAL_TABLET | Freq: Two times a day (BID) | ORAL | Status: DC
  Filled 2015-05-30 (×3): qty 1

## 2015-05-30 MED ORDER — HYDROXYZINE HCL 25 MG PO TABS: 50.0000 mg | ORAL_TABLET | ORAL | Status: DC | PRN

## 2015-05-30 MED ORDER — ACETAMINOPHEN 325 MG PO TABS: 650.0000 mg | ORAL_TABLET | ORAL | Status: DC | PRN

## 2015-05-30 MED ORDER — KETOROLAC TROMETHAMINE 30 MG/ML IJ SOLN
INTRAMUSCULAR | Status: AC
  Filled 2015-05-30: qty 1

## 2015-05-30 MED ORDER — GELATIN ABSORBABLE MT POWD
OROMUCOSAL | Status: DC | PRN
  Administered 2015-05-30: 13:00:00 via TOPICAL

## 2015-05-30 MED ORDER — HYDROXYZINE HCL 50 MG/ML IM SOLN
50.0000 mg | INTRAMUSCULAR | Status: DC | PRN
  Filled 2015-05-30: qty 1

## 2015-05-30 MED ORDER — SODIUM CHLORIDE 0.9 % IJ SOLN
INTRAMUSCULAR | Status: AC
  Filled 2015-05-30: qty 10

## 2015-05-30 MED ORDER — ALUM & MAG HYDROXIDE-SIMETH 200-200-20 MG/5ML PO SUSP: 30.0000 mL | Freq: Four times a day (QID) | ORAL | Status: DC | PRN

## 2015-05-30 MED ORDER — ARTIFICIAL TEARS OP OINT
TOPICAL_OINTMENT | OPHTHALMIC | Status: AC
  Filled 2015-05-30: qty 3.5

## 2015-05-30 MED ORDER — 0.9 % SODIUM CHLORIDE (POUR BTL) OPTIME
TOPICAL | Status: DC | PRN
  Administered 2015-05-30: 1000 mL

## 2015-05-30 MED ORDER — ONDANSETRON HCL 4 MG/2ML IJ SOLN
INTRAMUSCULAR | Status: DC | PRN
  Administered 2015-05-30: 4 mg via INTRAVENOUS

## 2015-05-30 MED ORDER — BUPIVACAINE HCL (PF) 0.5 % IJ SOLN
INTRAMUSCULAR | Status: DC | PRN
  Administered 2015-05-30: 10 mL

## 2015-05-30 MED ORDER — ONDANSETRON HCL 4 MG/2ML IJ SOLN: 4.0000 mg | Freq: Four times a day (QID) | INTRAMUSCULAR | Status: DC | PRN

## 2015-05-30 MED ORDER — HYDROMORPHONE HCL 1 MG/ML IJ SOLN
INTRAMUSCULAR | Status: AC
  Filled 2015-05-30: qty 1

## 2015-05-30 MED ORDER — HYDROCODONE-ACETAMINOPHEN 5-325 MG PO TABS
1.0000 | ORAL_TABLET | ORAL | Status: DC | PRN
  Administered 2015-05-30 – 2015-05-31 (×4): 2 via ORAL
  Filled 2015-05-30 (×3): qty 2

## 2015-05-30 MED ORDER — ROCURONIUM BROMIDE 100 MG/10ML IV SOLN
INTRAVENOUS | Status: DC | PRN
  Administered 2015-05-30: 50 mg via INTRAVENOUS

## 2015-05-30 MED ORDER — PROPOFOL 10 MG/ML IV BOLUS
INTRAVENOUS | Status: AC
  Filled 2015-05-30: qty 20

## 2015-05-30 MED ORDER — KCL IN DEXTROSE-NACL 20-5-0.45 MEQ/L-%-% IV SOLN
INTRAVENOUS | Status: DC
  Filled 2015-05-30 (×4): qty 1000

## 2015-05-30 MED ORDER — ROCURONIUM BROMIDE 50 MG/5ML IV SOLN
INTRAVENOUS | Status: AC
  Filled 2015-05-30: qty 2

## 2015-05-30 MED ORDER — MAGNESIUM HYDROXIDE 400 MG/5ML PO SUSP: 30.0000 mL | Freq: Every day | ORAL | Status: DC | PRN

## 2015-05-30 MED ORDER — ONDANSETRON HCL 4 MG/2ML IJ SOLN
INTRAMUSCULAR | Status: AC
  Filled 2015-05-30: qty 2

## 2015-05-30 MED ORDER — ONDANSETRON HCL 4 MG PO TABS: 4.0000 mg | ORAL_TABLET | Freq: Four times a day (QID) | ORAL | Status: DC | PRN

## 2015-05-30 MED ORDER — SODIUM CHLORIDE 0.9 % IJ SOLN
3.0000 mL | Freq: Two times a day (BID) | INTRAMUSCULAR | Status: DC
  Administered 2015-05-30: 3 mL via INTRAVENOUS

## 2015-05-30 MED ORDER — HYDROMORPHONE HCL 1 MG/ML IJ SOLN
0.2500 mg | INTRAMUSCULAR | Status: DC | PRN
  Administered 2015-05-30 (×4): 0.5 mg via INTRAVENOUS

## 2015-05-30 MED ORDER — ONDANSETRON HCL 4 MG/2ML IJ SOLN: 4.0000 mg | Freq: Once | INTRAMUSCULAR | Status: DC | PRN

## 2015-05-30 MED ORDER — TAMOXIFEN CITRATE 20 MG PO TABS: 20.0000 mg | ORAL_TABLET | Freq: Every morning | ORAL | Status: DC

## 2015-05-30 MED ORDER — LIDOCAINE HCL (CARDIAC) 20 MG/ML IV SOLN
INTRAVENOUS | Status: DC | PRN
  Administered 2015-05-30: 100 mg via INTRAVENOUS

## 2015-05-30 MED ORDER — LIDOCAINE-EPINEPHRINE 1 %-1:100000 IJ SOLN
INTRAMUSCULAR | Status: DC | PRN
  Administered 2015-05-30: 10 mL

## 2015-05-30 MED ORDER — THROMBIN 20000 UNITS EX SOLR
CUTANEOUS | Status: DC | PRN
  Administered 2015-05-30: 14:00:00 via TOPICAL

## 2015-05-30 MED ORDER — PNEUMOCOCCAL VAC POLYVALENT 25 MCG/0.5ML IJ INJ
0.5000 mL | INJECTION | INTRAMUSCULAR | Status: DC
  Filled 2015-05-30: qty 0.5

## 2015-05-30 MED ORDER — PROPOFOL 10 MG/ML IV BOLUS
INTRAVENOUS | Status: DC | PRN
  Administered 2015-05-30: 140 mg via INTRAVENOUS

## 2015-05-30 MED ORDER — MENTHOL 3 MG MT LOZG: 1.0000 | LOZENGE | OROMUCOSAL | Status: DC | PRN

## 2015-05-30 MED ORDER — EPHEDRINE SULFATE 50 MG/ML IJ SOLN
INTRAMUSCULAR | Status: DC | PRN
  Administered 2015-05-30: 10 mg via INTRAVENOUS

## 2015-05-30 MED ORDER — MEPERIDINE HCL 25 MG/ML IJ SOLN: 6.2500 mg | INTRAMUSCULAR | Status: DC | PRN

## 2015-05-30 MED ORDER — LIDOCAINE HCL (CARDIAC) 20 MG/ML IV SOLN
INTRAVENOUS | Status: AC
  Filled 2015-05-30: qty 5

## 2015-05-30 MED ORDER — KETOROLAC TROMETHAMINE 30 MG/ML IJ SOLN
30.0000 mg | Freq: Four times a day (QID) | INTRAMUSCULAR | Status: DC
  Administered 2015-05-30 – 2015-05-31 (×2): 30 mg via INTRAVENOUS
  Filled 2015-05-30 (×2): qty 1

## 2015-05-30 MED ORDER — MIDAZOLAM HCL 2 MG/2ML IJ SOLN
INTRAMUSCULAR | Status: AC
  Filled 2015-05-30: qty 4

## 2015-05-30 MED ORDER — PHENYLEPHRINE HCL 10 MG/ML IJ SOLN
INTRAMUSCULAR | Status: DC | PRN
  Administered 2015-05-30: 40 ug via INTRAVENOUS

## 2015-05-30 MED ORDER — LACTATED RINGERS IV SOLN
INTRAVENOUS | Status: DC
  Administered 2015-05-30 (×3): via INTRAVENOUS

## 2015-05-30 MED ORDER — CYCLOBENZAPRINE HCL 10 MG PO TABS
10.0000 mg | ORAL_TABLET | Freq: Three times a day (TID) | ORAL | Status: DC | PRN
  Administered 2015-05-30: 10 mg via ORAL
  Filled 2015-05-30: qty 1

## 2015-05-30 MED ORDER — ARTIFICIAL TEARS OP OINT
TOPICAL_OINTMENT | OPHTHALMIC | Status: DC | PRN
  Administered 2015-05-30: 1 via OPHTHALMIC

## 2015-05-30 MED ORDER — HYDROCODONE-ACETAMINOPHEN 5-325 MG PO TABS
ORAL_TABLET | ORAL | Status: AC
  Filled 2015-05-30: qty 2

## 2015-05-30 MED ORDER — SODIUM CHLORIDE 0.9 % IJ SOLN: 3.0000 mL | INTRAMUSCULAR | Status: DC | PRN

## 2015-05-30 MED ORDER — ACETAMINOPHEN 650 MG RE SUPP: 650.0000 mg | RECTAL | Status: DC | PRN

## 2015-05-30 SURGICAL SUPPLY — 71 items
BAG DECANTER FOR FLEXI CONT (MISCELLANEOUS) ×2 IMPLANT
BLADE CLIPPER SURG (BLADE) IMPLANT
BRUSH SCRUB EZ PLAIN DRY (MISCELLANEOUS) ×2 IMPLANT
BUR ACRON 5.0MM COATED (BURR) ×2 IMPLANT
BUR MATCHSTICK NEURO 3.0 LAGG (BURR) ×2 IMPLANT
CANISTER SUCT 3000ML PPV (MISCELLANEOUS) ×2 IMPLANT
CAP LCK SPNE (Orthopedic Implant) ×4 IMPLANT
CAP LOCK SPINE RADIUS (Orthopedic Implant) ×4 IMPLANT
CAP LOCKING (Orthopedic Implant) ×4 IMPLANT
CONT SPEC 4OZ CLIKSEAL STRL BL (MISCELLANEOUS) ×2 IMPLANT
COVER BACK TABLE 60X90IN (DRAPES) ×2 IMPLANT
DERMABOND ADHESIVE PROPEN (GAUZE/BANDAGES/DRESSINGS) ×1
DERMABOND ADVANCED (GAUZE/BANDAGES/DRESSINGS)
DERMABOND ADVANCED .7 DNX12 (GAUZE/BANDAGES/DRESSINGS) IMPLANT
DERMABOND ADVANCED .7 DNX6 (GAUZE/BANDAGES/DRESSINGS) ×1 IMPLANT
DRAPE C-ARM 42X72 X-RAY (DRAPES) ×4 IMPLANT
DRAPE LAPAROTOMY 100X72X124 (DRAPES) ×2 IMPLANT
DRAPE POUCH INSTRU U-SHP 10X18 (DRAPES) ×2 IMPLANT
DRAPE PROXIMA HALF (DRAPES) ×2 IMPLANT
DRSG EMULSION OIL 3X3 NADH (GAUZE/BANDAGES/DRESSINGS) IMPLANT
ELECT REM PT RETURN 9FT ADLT (ELECTROSURGICAL) ×2
ELECTRODE REM PT RTRN 9FT ADLT (ELECTROSURGICAL) ×1 IMPLANT
GAUZE SPONGE 4X4 12PLY STRL (GAUZE/BANDAGES/DRESSINGS) IMPLANT
GAUZE SPONGE 4X4 16PLY XRAY LF (GAUZE/BANDAGES/DRESSINGS) ×2 IMPLANT
GLOVE BIO SURGEON STRL SZ8 (GLOVE) ×2 IMPLANT
GLOVE BIOGEL PI IND STRL 7.5 (GLOVE) ×1 IMPLANT
GLOVE BIOGEL PI IND STRL 8 (GLOVE) ×2 IMPLANT
GLOVE BIOGEL PI INDICATOR 7.5 (GLOVE) ×1
GLOVE BIOGEL PI INDICATOR 8 (GLOVE) ×2
GLOVE ECLIPSE 7.5 STRL STRAW (GLOVE) ×4 IMPLANT
GLOVE EXAM NITRILE LRG STRL (GLOVE) IMPLANT
GLOVE EXAM NITRILE MD LF STRL (GLOVE) IMPLANT
GLOVE EXAM NITRILE XL STR (GLOVE) IMPLANT
GLOVE EXAM NITRILE XS STR PU (GLOVE) IMPLANT
GLOVE SURG SS PI 7.0 STRL IVOR (GLOVE) ×8 IMPLANT
GOWN STRL REUS W/ TWL LRG LVL3 (GOWN DISPOSABLE) ×1 IMPLANT
GOWN STRL REUS W/ TWL XL LVL3 (GOWN DISPOSABLE) ×3 IMPLANT
GOWN STRL REUS W/TWL 2XL LVL3 (GOWN DISPOSABLE) IMPLANT
GOWN STRL REUS W/TWL LRG LVL3 (GOWN DISPOSABLE) ×1
GOWN STRL REUS W/TWL XL LVL3 (GOWN DISPOSABLE) ×3
HEMOSTAT POWDER KIT SURGIFOAM (HEMOSTASIS) ×2 IMPLANT
KIT BASIN OR (CUSTOM PROCEDURE TRAY) ×2 IMPLANT
KIT ROOM TURNOVER OR (KITS) ×2 IMPLANT
MILL MEDIUM DISP (BLADE) ×2 IMPLANT
NEEDLE ASP BONE MRW 8GX15 (NEEDLE) ×2 IMPLANT
NEEDLE BONE MARROW 8GAX6 (NEEDLE) IMPLANT
NEEDLE SPNL 18GX3.5 QUINCKE PK (NEEDLE) ×2 IMPLANT
NEEDLE SPNL 22GX3.5 QUINCKE BK (NEEDLE) ×4 IMPLANT
NS IRRIG 1000ML POUR BTL (IV SOLUTION) ×2 IMPLANT
PACK LAMINECTOMY NEURO (CUSTOM PROCEDURE TRAY) ×2 IMPLANT
PAD ARMBOARD 7.5X6 YLW CONV (MISCELLANEOUS) ×6 IMPLANT
PATTIES SURGICAL .5 X.5 (GAUZE/BANDAGES/DRESSINGS) ×2 IMPLANT
PATTIES SURGICAL .5 X1 (DISPOSABLE) IMPLANT
PATTIES SURGICAL 1X1 (DISPOSABLE) IMPLANT
PEEK PLIF AVS 8X25X4 DEGREE (Peek) ×2 IMPLANT
ROD RADIUS 35MM (Rod) ×4 IMPLANT
SCREW 4.75X40MM (Screw) ×4 IMPLANT
SCREW 5.75X40M (Screw) ×4 IMPLANT
SPONGE LAP 4X18 X RAY DECT (DISPOSABLE) IMPLANT
SPONGE NEURO XRAY DETECT 1X3 (DISPOSABLE) IMPLANT
SPONGE SURGIFOAM ABS GEL 100 (HEMOSTASIS) ×2 IMPLANT
STRIP BIOACTIVE VITOSS 25X100X (Neuro Prosthesis/Implant) ×2 IMPLANT
SUT VIC AB 1 CT1 18XBRD ANBCTR (SUTURE) ×2 IMPLANT
SUT VIC AB 1 CT1 8-18 (SUTURE) ×2
SUT VIC AB 2-0 CP2 18 (SUTURE) ×4 IMPLANT
SYR 3ML LL SCALE MARK (SYRINGE) ×8 IMPLANT
SYR CONTROL 10ML LL (SYRINGE) ×2 IMPLANT
TOWEL OR 17X24 6PK STRL BLUE (TOWEL DISPOSABLE) ×2 IMPLANT
TOWEL OR 17X26 10 PK STRL BLUE (TOWEL DISPOSABLE) ×2 IMPLANT
TRAY FOLEY W/METER SILVER 14FR (SET/KITS/TRAYS/PACK) ×2 IMPLANT
WATER STERILE IRR 1000ML POUR (IV SOLUTION) ×2 IMPLANT

## 2015-05-30 NOTE — Anesthesia Procedure Notes (Signed)
Procedure Name: Intubation Date/Time: 05/30/2015 12:11 PM Performed by: Scheryl Darter Pre-anesthesia Checklist: Patient identified, Emergency Drugs available, Suction available, Patient being monitored and Timeout performed Patient Re-evaluated:Patient Re-evaluated prior to inductionOxygen Delivery Method: Circle system utilized Preoxygenation: Pre-oxygenation with 100% oxygen Intubation Type: IV induction Ventilation: Mask ventilation without difficulty Laryngoscope Size: Miller and 2 Grade View: Grade I Tube type: Oral Tube size: 7.5 mm Number of attempts: 1 Airway Equipment and Method: Stylet Placement Confirmation: ETT inserted through vocal cords under direct vision,  positive ETCO2 and breath sounds checked- equal and bilateral Secured at: 22 cm Tube secured with: Tape Dental Injury: Teeth and Oropharynx as per pre-operative assessment

## 2015-05-30 NOTE — Transfer of Care (Signed)
Immediate Anesthesia Transfer of Care Note  Patient: Diana Schwartz  Procedure(s) Performed: Procedure(s) with comments: Lumbar one-two decompression with posterior lumbar interbody fusion with interbody prosthesis posterior lateral arthrodesis and posterior nonsegmental instrumentation (N/A) - Lumbar one-two decompression with posterior lumbar interbody fusion with interbody prosthesis posterior lateral arthrodesis and posterior nonsegmental instrumentation  Patient Location: PACU  Anesthesia Type:General  Level of Consciousness: awake, alert , oriented and patient cooperative  Airway & Oxygen Therapy: Patient Spontanous Breathing  Post-op Assessment: Report given to RN and Post -op Vital signs reviewed and stable  Post vital signs: Reviewed and stable  Last Vitals:  Filed Vitals:   05/30/15 1516  BP: 123/81  Pulse: 83  Temp: 37.2 C  Resp: 36    Complications: No apparent anesthesia complications

## 2015-05-30 NOTE — Plan of Care (Signed)
Problem: Consults Goal: Diagnosis - Spinal Surgery Outcome: Completed/Met Date Met:  05/30/15 Thoraco/Lumbar Spine Fusion     

## 2015-05-30 NOTE — Progress Notes (Signed)
Orthopedic Tech Progress Note Patient Details:  Diana Schwartz [birth year] [id number] Patient has own brace. Patient ID: [name], female   DOB: [birth year], 49 y.o.   MRN: [id number]   Braulio Bosch 05/30/2015, 3:31 PM

## 2015-05-30 NOTE — Anesthesia Preprocedure Evaluation (Signed)
Anesthesia Evaluation  Patient identified by MRN, date of birth, ID band Patient awake    Reviewed: Allergy & Precautions, NPO status , Patient's Chart, lab work & pertinent test results  Airway Mallampati: I  TM Distance: >3 FB Neck ROM: Full    Dental   Pulmonary    Pulmonary exam normal        Cardiovascular Normal cardiovascular exam     Neuro/Psych Anxiety Depression    GI/Hepatic (+) Hepatitis -  Endo/Other    Renal/GU      Musculoskeletal   Abdominal   Peds  Hematology   Anesthesia Other Findings   Reproductive/Obstetrics                             Anesthesia Physical Anesthesia Plan  ASA: II  Anesthesia Plan: General   Post-op Pain Management:    Induction: Intravenous  Airway Management Planned: Oral ETT  Additional Equipment:   Intra-op Plan:   Post-operative Plan: Extubation in OR  Informed Consent: I have reviewed the patients History and Physical, chart, labs and discussed the procedure including the risks, benefits and alternatives for the proposed anesthesia with the patient or authorized representative who has indicated his/her understanding and acceptance.     Plan Discussed with: CRNA and Surgeon  Anesthesia Plan Comments:         Anesthesia Quick Evaluation

## 2015-05-30 NOTE — H&P (Signed)
Subjective: Patient is a 49 y.o. right-handed white female who is admitted for treatment of advanced spondylosis and degenerative disc disease at the L1-2 level with associated instability. Patient's had increasing difficulties with low back pain for more than 3 years. She is had difficulties with intense back pain and spasms, radiate around her lower torso bilaterally over the past year. Her pain is aggravated by sneezing and coughing. Typically the pain is in the mid back and thoracolumbar junction region, as well as was admitted lumbar to lower lumbar region. It can radiate around into the medial groins and proximal medial thighs bilaterally. She cannot sleep on either for size, and has to sleep on her back. X-rays show advanced degeneration at the L1-2 level with loss of disc space height, with central collapse. MRI shows posterior disc bulging and a mild kyphosis at the L1-2 level. Patient admitted now for an L1-2 lumbar decompression including laminectomy, facetectomy, foraminotomy, and posterior lumbar interbody arthrodesis with interbody implants and bone graft and posterior lateral arthrodesis with posterior instrumentation and bone graft.   Past Medical History  Diagnosis Date  . History of breast cancer NO RECURRENCE    DX 2013  LEFT BREAST CANCER--  S/P BILATERAL MASTECTOMY AND LEFT AXILLA LYMPH NODE DISSECTOMY/  NO CHEMORADIATION  . Osteoarthritis   . Rheumatoid arthritis   . Degenerative disc disease, lumbar   . Autoimmune hepatitis     no current med.  . Rash     arms, chest - due to lupus  . Depression   . Fibromyalgia   . Carpal tunnel syndrome of left wrist   . Lupus (systemic lupus erythematosus)   . Anxiety   . Cancer     breast cancer    Past Surgical History  Procedure Laterality Date  . Anterior cervical decomp/discectomy fusion  02/06/2004    C5-6, C6-7  . Breast reconstruction  06/2013  . Carpal tunnel release Right 1989  . Axillary node dissection Left 08/2012  .  Vaginal hysterectomy  1999  . Mastectomy Bilateral 08/12/2012    LEFT BREAST CANCER  . Carpal tunnel release Left 10/02/2013    Procedure: LEFT CARPAL TUNNEL RELEASE;  Surgeon: Linna Hoff, MD;  Location: [facility];  Service: Orthopedics;  Laterality: Left;  Local Anesthesia with IV Sedation  . Foot surgery Right     bunion and broke toe and then realign toe    No prescriptions prior to admission   Allergies  Allergen Reactions  . Oxycodone Anaphylaxis, Hives and Swelling    Whelps and swelling in throat  . Prednisone Nausea And Vomiting and Diarrhea    Can take IV steroids    Social History  Substance Use Topics  . Smoking status: Never Smoker   . Smokeless tobacco: Never Used  . Alcohol Use: Yes     Comment: socially    No family history on file.   Review of Systems Pertinent items are noted in HPI.  Objective: Vital signs in last 24 hours:    EXAM: Patient is a well-developed well-nourished white female in no acute distress. Lungs are clear to auscultation , the patient has symmetrical respiratory excursion. Heart has a regular rate and rhythm normal S1 and S2 no murmur.   Abdomen is soft nontender nondistended bowel sounds are present. Extremity examination shows no clubbing cyanosis or edema. Motor examination shows 5 over 5 strength in the lower extremities including the iliopsoas quadriceps dorsiflexor extensor hallicus  longus and plantar flexor bilaterally.  Sensation is intact to pinprick in the distal lower extremities. Reflexes are symmetrical bilaterally. No pathologic reflexes are present. Patient has a normal gait and stance.   Data Review:CBC    Component Value Date/Time   WBC 3.9* 05/22/2015 1216   RBC 3.90 05/22/2015 1216   HGB 14.0 05/22/2015 1216   HCT 41.1 05/22/2015 1216   PLT 206 05/22/2015 1216   MCV 105.4* 05/22/2015 1216   MCH 35.9* 05/22/2015 1216   MCHC 34.1 05/22/2015 1216   RDW 13.3 05/22/2015 1216                           BMET    Component Value Date/Time   NA 138 05/22/2015 1216   K 4.3 05/22/2015 1216   CL 105 05/22/2015 1216   CO2 27 05/22/2015 1216   GLUCOSE 92 05/22/2015 1216   BUN 8 05/22/2015 1216   CREATININE 0.73 05/22/2015 1216   CALCIUM 9.1 05/22/2015 1216   GFRNONAA >60 05/22/2015 1216   GFRAA >60 05/22/2015 1216     Assessment/Plan: Patient with advanced degeneration and instability at the L1-2 level who is admitted now for an L1-2 lumbar decompression and arthrodesis.  I've discussed with the patient the nature of his condition, the nature the surgical procedure, the typical length of surgery, hospital stay, and overall recuperation, the limitations postoperatively, and risks of surgery. I discussed risks including risks of infection, bleeding, possibly need for transfusion, the risk of nerve root dysfunction with pain, weakness, numbness, or paresthesias, the risk of dural tear and CSF leakage and possible need for further surgery, the risk of failure of the arthrodesis and possibly for further surgery, the risk of anesthetic complications including myocardial infarction, stroke, pneumonia, and death. We discussed the need for postoperative immobilization in a lumbar brace. Understanding all this the patient does wish to proceed with surgery and is admitted for such.     Hosie Spangle, MD 05/30/2015 8:11 AM

## 2015-05-30 NOTE — Anesthesia Postprocedure Evaluation (Signed)
Anesthesia Post Note  Patient: Diana Schwartz  Procedure(s) Performed: Procedure(s) (LRB): Lumbar one-two decompression with posterior lumbar interbody fusion with interbody prosthesis posterior lateral arthrodesis and posterior nonsegmental instrumentation (N/A)  Anesthesia type: general  Patient location: PACU  Post pain: Pain level controlled  Post assessment: Patient's Cardiovascular Status Stable  Last Vitals:  Filed Vitals:   05/30/15 1630  BP: 98/54  Pulse: 85  Temp:   Resp: 26    Post vital signs: Reviewed and stable  Level of consciousness: sedated  Complications: No apparent anesthesia complications

## 2015-05-30 NOTE — Op Note (Signed)
05/30/2015  3:08 PM  PATIENT:  Diana Schwartz  49 y.o. female  PRE-OPERATIVE DIAGNOSIS:  lumbar spondylosis with radiculopathy, lumbar ddd, lumbar instability, lumbago  POST-OPERATIVE DIAGNOSIS:  lumbar spondylosis with radiculopathy, lumbar ddd, lumbar instability, lumbago  PROCEDURE:  Procedure(s):  Bilateral L1-2 lumbar decompression including bilateral laminotomies, facetectomies, and foraminotomies, with decompression beyond that required for interbody arthrodesis; bilateral L1-2 posterior lumbar interbody arthrodesis with AVS peek interbody implants and Vitoss BA with bone marrow aspirate; bilateral L1-2 posterior lateral arthrodesis with nonsegmental radius posterior instrumentation, locally harvested morcellized autograft, and Vitoss BA with bone marrow aspirate  SURGEON:  Surgeon(s): Jovita Gamma, MD  ASSISTANTS: Sherley Bounds, M.D.  ANESTHESIA:   general  EBL:  Total I/O In: 1500 [I.V.:1500] Out: 480 [Urine:380; Blood:100]  BLOOD ADMINISTERED:none  CELL SAVER GIVEN: Insufficient blood loss to process collected blood  COUNT: Correct per nursing staff  DICTATION: Patient is brought to the operating room placed under general endotracheal anesthesia. The patient was turned to prone position the lumbar region was prepped with Betadine soap and solution and draped in a sterile fashion. The midline was infiltrated with local anesthesia with epinephrine. A localizing x-ray was taken and then a midline incision was made carried down through the subcutaneous tissue, bipolar cautery and electrocautery were used to maintain hemostasis. Dissection was carried down to the lumbar fascia. The fascia was incised bilaterally and the paraspinal muscles were dissected with a spinous process and lamina in a subperiosteal fashion. Another x-ray was taken for localization and the L1-2 level was localized. Dissection was then carried out laterally over the facet complex and the transverse processes  of L1 and L2 were exposed and decorticated. We then proceeded with the decompression. Bilateral laminotomies performed using the high-speed drill and Kerrison punches. The dissection was carried out laterally, including facetectomy and foraminotomies.  Once the decompression of the spinal canal and neural foramina was completed we proceeded with the posterior lumbar interbody arthrodesis. The annulus was incised bilaterally and the disc space entered. A thorough discectomy was performed using pituitary rongeurs and curettes. Once the discectomy was completed we began to prepare the endplate surfaces removing the cartilaginous endplates surface. We then measured the height of the intervertebral disc space. We selected 8 x 25 x 4 AVS peek interbody implants.  The C-arm fluoroscope was then draped and brought in the field and we identified the pedicle entry points bilaterally at the L1 and L2 levels. Each of the 4 pedicles was probed, we aspirated bone marrow aspirate from the vertebral bodies, this was injected over a 10 cc strip of Vitoss BA. Then each of the pedicles was examined with the ball probe good bony surfaces were found and no bony cuts were found. The pedicles of L1 were tapped with a 4.25 mm tap, examined with a ball probe, good threading was found, and no bony cuts were found. We then placed 4.75 x 40 mm screws bilaterally at the L1 level. The pedicles of L2 were tapped with a 5.25 mm tap, examined with the ball probe, good threading was found, and no bony cuts were found. We then placed 5.75 x 40 mm screws bilaterally at the L2 level.   We then packed the AVS peek interbody implants with Vitoss BA with bone marrow aspirate, and then placed the first implant and on the right side, carefully retracting the thecal sac and nerve root medially. We then went back to the left side and packed the midline with additional Vitoss BA with  bone marrow aspirate and then placed a second implant and on the left  side again retracting the thecal sac and nerve root medially.   We then packed the lateral gutter over the transverse processes and intertransverse space with locally harvested morcellized autograft and Vitoss BA with bone marrow aspirate. We then selected a 35 mm pre-lordosed rods, they were placed within the screw heads and secured with locking caps once all 4 locking caps were placed final tightening was performed against a counter torque.  The wound had been irrigated multiple times during the procedure with saline solution and bacitracin solution, good hemostasis was established with a combination of bipolar cautery and Gelfoam with thrombin. Once good hemostasis was confirmed we proceeded with closure paraspinal muscles deep fascia and Scarpa's fascia were closed with interrupted undyed 1 Vicryl sutures the subcutaneous and subcuticular closed with interrupted inverted 2-0 undyed Vicryl sutures the skin edges were approximated with Dermabond.  Following surgery the patient was turned back to the supine position to be reversed and the anesthetic extubated and transferred to the recovery room for further care.   PLAN OF CARE: Admit to inpatient   PATIENT DISPOSITION:  PACU - hemodynamically stable.   Delay start of Pharmacological VTE agent (>24hrs) due to surgical blood loss or risk of bleeding:  yes

## 2015-05-30 NOTE — Progress Notes (Signed)
Filed Vitals:   05/30/15 1600 05/30/15 1615 05/30/15 1630 05/30/15 1701  BP: 100/58 1'01/58 98/54 93/53 '$  Pulse: 80 83 85 79  Temp:   98.4 F (36.9 C) 98.2 F (36.8 C)  TempSrc:      Resp: 19 35 26 18  Height:      Weight:      SpO2: 95% 96% 95% 96%    Resting in bed. Comfortable. Has been up and ambulating in the halls with a staff. Wound clean and dry. Foley to straight drainage.  Plan: Will have staff DC Foley, and monitor voiding function. Encouraged to ambulate in the halls. We will continue to progress through postoperative recovery.  Hosie Spangle, MD 05/30/2015, 7:26 PM

## 2015-05-31 MED ORDER — CYCLOBENZAPRINE HCL 10 MG PO TABS: 5.0000 mg | ORAL_TABLET | Freq: Three times a day (TID) | ORAL | Status: DC | PRN

## 2015-05-31 MED ORDER — HYDROCODONE-ACETAMINOPHEN 5-325 MG PO TABS: 1.0000 | ORAL_TABLET | ORAL | Status: DC | PRN

## 2015-05-31 MED FILL — Sodium Chloride IV Soln 0.9%: INTRAVENOUS | Qty: 1000 | Status: AC

## 2015-05-31 MED FILL — Heparin Sodium (Porcine) Inj 1000 Unit/ML: INTRAMUSCULAR | Qty: 30 | Status: AC

## 2015-05-31 NOTE — Discharge Instructions (Signed)

## 2015-05-31 NOTE — Progress Notes (Signed)
Patient alert and oriented, mae's well, voiding adequate amount of urine, swallowing without difficulty, no c/o pain. Patient discharged home with family. Script and discharged instructions given to patient. Patient and family stated understanding of d/c instructions given and has an appointment with MD. 

## 2015-05-31 NOTE — Discharge Summary (Signed)
Physician Discharge Summary  Patient ID: Diana Schwartz MRN: [id number] DOB/AGE: 12/07/1965 49 y.o.  Admit date: 05/30/2015 Discharge date: 05/31/2015  Admission Diagnoses:  lumbar spondylosis with radiculopathy, lumbar ddd, lumbar instability, lumbago  Discharge Diagnoses:  lumbar spondylosis with radiculopathy, lumbar ddd, lumbar instability, lumbago Active Problems:   Lumbar spondylosis   Discharged Condition: good  Hospital Course: Patient was admitted, underwent a L1-2 lumbar decompression and arthrodesis. She is up and ambulating actively in the halls. She is comfortable. We are discharging her to home with instructions regarding wound care and activities. She is scheduled for follow-up with me in about 4 weeks.  Discharge Exam: Blood pressure 103/43, pulse 72, temperature 99.1 F (37.3 C), temperature source Oral, resp. rate 18, height '5\' 3"'$  (1.6 m), weight 63.305 kg (139 lb 9 oz), SpO2 97 %.  Disposition: 01-Home or Self Care     Medication List    TAKE these medications        azaTHIOprine 50 MG tablet  Commonly known as:  IMURAN  Take 50 mg by mouth 2 (two) times daily.     cyclobenzaprine 10 MG tablet  Commonly known as:  FLEXERIL  Take 0.5-1 tablets (5-10 mg total) by mouth 3 (three) times daily as needed for muscle spasms.     HYDROcodone-acetaminophen 5-325 MG tablet  Commonly known as:  NORCO/VICODIN  Take 1-2 tablets by mouth every 4 (four) hours as needed (mild pain).     hydroxychloroquine 200 MG tablet  Commonly known as:  PLAQUENIL  Take 200 mg by mouth 2 (two) times daily.     tamoxifen 20 MG tablet  Commonly known as:  NOLVADEX  Take 20 mg by mouth every morning.     venlafaxine XR 75 MG 24 hr capsule  Commonly known as:  EFFEXOR-XR  Take 225 mg by mouth daily.         SignedHosie Spangle 05/31/2015, 8:33 AM

## 2015-09-25 ENCOUNTER — Encounter: Payer: Self-pay | Admitting: Gastroenterology

## 2015-10-01 ENCOUNTER — Encounter: Payer: Self-pay | Admitting: Gastroenterology

## 2015-11-07 ENCOUNTER — Ambulatory Visit (AMBULATORY_SURGERY_CENTER): Payer: Self-pay | Admitting: *Deleted

## 2015-11-07 VITALS — Ht 63.0 in | Wt 137.2 lb

## 2015-11-07 DIAGNOSIS — Z1211 Encounter for screening for malignant neoplasm of colon: Secondary | ICD-10-CM

## 2015-11-07 MED ORDER — NA SULFATE-K SULFATE-MG SULF 17.5-3.13-1.6 GM/177ML PO SOLN: 1.0000 | Freq: Once | ORAL | Status: DC

## 2015-11-07 NOTE — Progress Notes (Signed)
.  extra

## 2015-11-19 ENCOUNTER — Telehealth: Payer: Self-pay | Admitting: Gastroenterology

## 2015-11-19 NOTE — Telephone Encounter (Signed)
No charge. 

## 2015-11-21 ENCOUNTER — Encounter: Payer: 59 | Admitting: Gastroenterology

## 2015-11-26 ENCOUNTER — Encounter: Payer: Self-pay | Admitting: Gastroenterology

## 2015-11-26 ENCOUNTER — Ambulatory Visit (AMBULATORY_SURGERY_CENTER): Payer: 59 | Admitting: Gastroenterology

## 2015-11-26 VITALS — BP 90/67 | HR 73 | Temp 97.5°F | Resp 12 | Ht 63.0 in | Wt 137.0 lb

## 2015-11-26 DIAGNOSIS — D123 Benign neoplasm of transverse colon: Secondary | ICD-10-CM

## 2015-11-26 DIAGNOSIS — Z1211 Encounter for screening for malignant neoplasm of colon: Secondary | ICD-10-CM | POA: Diagnosis present

## 2015-11-26 DIAGNOSIS — D124 Benign neoplasm of descending colon: Secondary | ICD-10-CM

## 2015-11-26 MED ORDER — SODIUM CHLORIDE 0.9 % IV SOLN: 500.0000 mL | INTRAVENOUS | Status: DC

## 2015-11-26 NOTE — Progress Notes (Signed)
Report to PACU, RN, vss, BBS= Clear.  

## 2015-11-26 NOTE — Patient Instructions (Signed)
Discharge instructions given. Handout on polyps. Resume previous medications. YOU HAD AN ENDOSCOPIC PROCEDURE TODAY AT THE [facility]:   Refer to the procedure report that was given to you for any specific questions about what was found during the examination.  If the procedure report does not answer your questions, please call your gastroenterologist to clarify.  If you requested that your care partner not be given the details of your procedure findings, then the procedure report has been included in a sealed envelope for you to review at your convenience later.  YOU SHOULD EXPECT: Some feelings of bloating in the abdomen. Passage of more gas than usual.  Walking can help get rid of the air that was put into your GI tract during the procedure and reduce the bloating. If you had a lower endoscopy (such as a colonoscopy or flexible sigmoidoscopy) you may notice spotting of blood in your stool or on the toilet paper. If you underwent a bowel prep for your procedure, you may not have a normal bowel movement for a few days.  Please Note:  You might notice some irritation and congestion in your nose or some drainage.  This is from the oxygen used during your procedure.  There is no need for concern and it should clear up in a day or so.  SYMPTOMS TO REPORT IMMEDIATELY:   Following lower endoscopy (colonoscopy or flexible sigmoidoscopy):  Excessive amounts of blood in the stool  Significant tenderness or worsening of abdominal pains  Swelling of the abdomen that is new, acute  Fever of 100F or higher   For urgent or emergent issues, a gastroenterologist can be reached at any hour by calling [phone].   DIET: Your first meal following the procedure should be a small meal and then it is ok to progress to your normal diet. Heavy or fried foods are harder to digest and may make you feel nauseous or bloated.  Likewise, meals heavy in dairy and vegetables can increase bloating.  Drink  plenty of fluids but you should avoid alcoholic beverages for 24 hours.  ACTIVITY:  You should plan to take it easy for the rest of today and you should NOT DRIVE or use heavy machinery until tomorrow (because of the sedation medicines used during the test).    FOLLOW UP: Our staff will call the number listed on your records the next business day following your procedure to check on you and address any questions or concerns that you may have regarding the information given to you following your procedure. If we do not reach you, we will leave a message.  However, if you are feeling well and you are not experiencing any problems, there is no need to return our call.  We will assume that you have returned to your regular daily activities without incident.  If any biopsies were taken you will be contacted by phone or by letter within the next 1-3 weeks.  Please call us at [phone] if you have not heard about the biopsies in 3 weeks.    SIGNATURES/CONFIDENTIALITY: You and/or your care partner have signed paperwork which will be entered into your electronic medical record.  These signatures attest to the fact that that the information above on your After Visit Summary has been reviewed and is understood.  Full responsibility of the confidentiality of this discharge information lies with you and/or your care-partner. 

## 2015-11-26 NOTE — Progress Notes (Signed)
Called to room to assist during endoscopic procedure.  Patient ID and intended procedure confirmed with present staff. Received instructions for my participation in the procedure from the performing physician.  

## 2015-11-26 NOTE — Op Note (Signed)
[place] Patient Name: Diana Schwartz Procedure Date: 11/26/2015 2:44 PM MRN: [id number] Endoscopist: Ladene Artist , MD Age: 50 Referring MD:  Date of Birth: 1966/03/19 Gender: Female Procedure:                Colonoscopy Indications:              Screening for colorectal malignant neoplasm Medicines:                Monitored Anesthesia Care Procedure:                Pre-Anesthesia Assessment:                           - Prior to the procedure, a History and Physical                            was performed, and patient medications and                            allergies were reviewed. The patient's tolerance of                            previous anesthesia was also reviewed. The risks                            and benefits of the procedure and the sedation                            options and risks were discussed with the patient.                            All questions were answered, and informed consent                            was obtained. Prior Anticoagulants: The patient has                            taken no previous anticoagulant or antiplatelet                            agents. ASA Grade Assessment: II - A patient with                            mild systemic disease. After reviewing the risks                            and benefits, the patient was deemed in                            satisfactory condition to undergo the procedure.                           After obtaining informed consent, the colonoscope  was passed under direct vision. Throughout the                            procedure, the patient's blood pressure, pulse, and                            oxygen saturations were monitored continuously. The                            Model PCF-H190L [id number]) scope was introduced                            through the anus and advanced to the the cecum,                            identified by appendiceal orifice and  ileocecal                            valve. The colonoscopy was performed without                            difficulty. The patient tolerated the procedure                            well. The quality of the bowel preparation was                            good. The ileocecal valve, appendiceal orifice, and                            rectum were photographed. Scope In: 2:55:15 PM Scope Out: 3:13:17 PM Scope Withdrawal Time: 0 hours 14 minutes 50 seconds  Total Procedure Duration: 0 hours 18 minutes 2 seconds  Findings:      The digital rectal exam was normal.      A 5 mm polyp was found in the transverse colon. The polyp was sessile.       The polyp was removed with a cold snare. Resection and retrieval were       complete.      A 4 mm polyp was found in the descending colon. The polyp was sessile.       The polyp was removed with a cold biopsy forceps. Resection and       retrieval were complete.      The exam was otherwise normal throughout the examined colon.      The retroflexed view of the distal rectum and anal verge was normal and       showed no anal or rectal abnormalities. Complications:            No immediate complications. Estimated Blood Loss:     Estimated blood loss: none. Impression:               - One 5 mm polyp in the transverse colon, removed                            with a cold snare. Resected and retrieved.                           -  One 4 mm polyp in the descending colon, removed                            with a cold biopsy forceps. Resected and retrieved.                           - The distal rectum and anal verge are normal on                            retroflexion view. Recommendation:           - Patient has a contact number available for                            emergencies. The signs and symptoms of potential                            delayed complications were discussed with the                            patient. Return to normal activities  tomorrow.                            Written discharge instructions were provided to the                            patient.                           - Resume previous diet.                           - Continue present medications.                           - Await pathology results.                           - Repeat colonoscopy in 5 years for surveillance if                            polyp(s) precanerous, otherwise 10 years. Ladene Artist, MD 11/26/2015 3:21:19 PM This report has been signed electronically. Number of Addenda: 0 Referring MD:      Lona Kettle, MD

## 2015-11-27 ENCOUNTER — Telehealth: Payer: Self-pay | Admitting: *Deleted

## 2015-11-27 NOTE — Telephone Encounter (Signed)
  Follow up Call-  Call back number 11/26/2015  Post procedure Call Back phone  # [phone]  Permission to leave phone message Yes     Patient questions:  Do you have a fever, pain , or abdominal swelling? No. Pain Score  0 *  Have you tolerated food without any problems? Yes.    Have you been able to return to your normal activities? Yes.    Do you have any questions about your discharge instructions: Diet   No. Medications  No. Follow up visit  No.  Do you have questions or concerns about your Care? No.  Actions: * If pain score is 4 or above: No action needed, pain <4.  Spoke with spouse, she's in bed. No problems that he is aware of at this time. Encouraged him to tell her to call us if needed.

## 2015-12-03 ENCOUNTER — Encounter: Payer: Self-pay | Admitting: Gastroenterology

## 2017-08-04 ENCOUNTER — Other Ambulatory Visit (INDEPENDENT_AMBULATORY_CARE_PROVIDER_SITE_OTHER): Payer: 59

## 2017-08-04 ENCOUNTER — Ambulatory Visit (INDEPENDENT_AMBULATORY_CARE_PROVIDER_SITE_OTHER): Payer: 59 | Admitting: Nurse Practitioner

## 2017-08-04 ENCOUNTER — Encounter: Payer: Self-pay | Admitting: Nurse Practitioner

## 2017-08-04 VITALS — BP 98/66 | HR 84 | Ht 63.0 in | Wt 127.8 lb

## 2017-08-04 DIAGNOSIS — R1032 Left lower quadrant pain: Secondary | ICD-10-CM | POA: Diagnosis not present

## 2017-08-04 DIAGNOSIS — R509 Fever, unspecified: Secondary | ICD-10-CM | POA: Diagnosis not present

## 2017-08-04 DIAGNOSIS — K5900 Constipation, unspecified: Secondary | ICD-10-CM

## 2017-08-04 LAB — CBC WITH DIFFERENTIAL/PLATELET
BASOS ABS: 0 10*3/uL (ref 0.0–0.1)
Basophils Relative: 0.6 % (ref 0.0–3.0)
EOS PCT: 1.2 % (ref 0.0–5.0)
Eosinophils Absolute: 0.1 10*3/uL (ref 0.0–0.7)
HCT: 42.9 % (ref 36.0–46.0)
HEMOGLOBIN: 14.5 g/dL (ref 12.0–15.0)
Lymphocytes Relative: 15.6 % (ref 12.0–46.0)
Lymphs Abs: 1 10*3/uL (ref 0.7–4.0)
MCHC: 33.9 g/dL (ref 30.0–36.0)
MCV: 104.4 fl — AB (ref 78.0–100.0)
MONOS PCT: 12.6 % — AB (ref 3.0–12.0)
Monocytes Absolute: 0.8 10*3/uL (ref 0.1–1.0)
Neutro Abs: 4.6 10*3/uL (ref 1.4–7.7)
Neutrophils Relative %: 70 % (ref 43.0–77.0)
Platelets: 339 10*3/uL (ref 150.0–400.0)
RBC: 4.11 Mil/uL (ref 3.87–5.11)
RDW: 14.2 % (ref 11.5–15.5)
WBC: 6.6 10*3/uL (ref 4.0–10.5)

## 2017-08-04 LAB — BASIC METABOLIC PANEL
BUN: 10 mg/dL (ref 6–23)
CO2: 29 mEq/L (ref 19–32)
Calcium: 9.2 mg/dL (ref 8.4–10.5)
Chloride: 100 mEq/L (ref 96–112)
Creatinine, Ser: 0.66 mg/dL (ref 0.40–1.20)
GFR: 99.99 mL/min (ref >60.00)
Glucose, Bld: 92 mg/dL (ref 70–99)
POTASSIUM: 4.2 meq/L (ref 3.5–5.1)
SODIUM: 135 meq/L (ref 135–145)

## 2017-08-04 NOTE — Patient Instructions (Signed)
If you are age 51 or older, your body mass index should be between 23-30. Your Body mass index is 22.64 kg/m. If this is out of the aforementioned range listed, please consider follow up with your Primary Care Provider.  If you are age 58 or younger, your body mass index should be between 19-25. Your Body mass index is 22.64 kg/m. If this is out of the aformentioned range listed, please consider follow up with your Primary Care Provider.   Your physician has requested that you go to the basement for the following lab work before leaving today: CBC w/Diff BMET  You have been scheduled for a CT scan of the abdomen and pelvis at [facility] ([address]---this is in the same building as Press [facility]).   You are scheduled on 08/13/17 at 8:30 am. You should arrive 15 minutes prior to your appointment time for registration. Please follow the written instructions below on the day of your exam:  WARNING: IF YOU ARE ALLERGIC TO IODINE/X-RAY DYE, PLEASE NOTIFY RADIOLOGY IMMEDIATELY AT [phone]! YOU WILL BE GIVEN A 13 HOUR PREMEDICATION PREP.  1) Do not eat anything after 4:30 am (4 hours prior to your test) 2) You have been given 2 bottles of oral contrast to drink. The solution may taste               better if refrigerated, but do NOT add ice or any other liquid to this solution. Shake             well before drinking.    Drink 1 bottle of contrast @ 6:30 am (2 hours prior to your exam)  Drink 1 bottle of contrast @ 7:30 am (1 hour prior to your exam)  You may take any medications as prescribed with a small amount of water except for the following: Metformin, Glucophage, Glucovance, Avandamet, Riomet, Fortamet, Actoplus Met, Janumet, Glumetza or Metaglip. The above medications must be held the day of the exam AND 48 hours after the exam.  The purpose of you drinking the oral contrast is to aid in the visualization of your intestinal tract. The contrast solution may cause  some diarrhea. Before your exam is started, you will be given a small amount of fluid to drink. Depending on your individual set of symptoms, you may also receive an intravenous injection of x-ray contrast/dye. Plan on being at [facility] for 30 minutes or longer, depending on the type of exam you are having performed.  This test typically takes 30-45 minutes to complete.  If you have any questions regarding your exam or if you need to reschedule, you may call the CT department at [phone] between the hours of 8:00 am and 5:00 pm, Monday-Friday.  Thank you for choosing me and [facility].   Tye Savoy, NP

## 2017-08-04 NOTE — Progress Notes (Signed)
Chief Complaint:  Abdominal pain  HPI:  Patient is a 51 year old female with hx of breast cancer, RA, and depression. She also gives a hx of autoimmune hepatitis. Her Rheumatologist is Dr. Lenna Gilford.  She is maintained on plaquenil and imuran. She is known to Dr. Fuller Plan from a screening colonoscopy with removal of 2 small hyperplastic polyps in April 2017  Patient is here with a one week hx of constant LLQ achy pain unrelated to eating. BMs or activity. Pain 7/10 on pain scale and keeping her from falling asleep at night. She has had some associated nausea. She reports fevers just over a 100.  degrees.Patient has never had this type of pain before. She has a "bad " back but no new back pain or acute worsening of her chronic back pain. A month ago she developed constipation with hard stools. Drinking plenty of fluids, no recent medication changes. She is having a BM every day and does not feel pain related to constipation. No urinary sx. No vaginal sx.       Past Medical History:  Diagnosis Date  . Anxiety   . Autoimmune hepatitis ([place])    no current med.  . Cancer [facility])    breast cancer  . Carpal tunnel syndrome of left wrist   . Degenerative disc disease, lumbar   . Depression   . Fibromyalgia   . History of breast cancer NO RECURRENCE   DX 2013  LEFT BREAST CANCER--  S/P BILATERAL MASTECTOMY AND LEFT AXILLA LYMPH NODE DISSECTOMY/  NO CHEMORADIATION  . Lupus (systemic lupus erythematosus) ([place])   . Osteoarthritis   . Rash    arms, chest - due to lupus  . Rheumatoid arthritis [facility])      Past Surgical History:  Procedure Laterality Date  . ANTERIOR CERVICAL DECOMP/DISCECTOMY FUSION  02/06/2004   C5-6, C6-7  . AXILLARY NODE DISSECTION Left 08/2012  . BREAST RECONSTRUCTION  06/2013  . CARPAL TUNNEL RELEASE Right 1989  . CARPAL TUNNEL RELEASE Left 10/02/2013   Procedure: LEFT CARPAL TUNNEL RELEASE;  Surgeon: Linna Hoff, MD;  Location: [facility];  Service:  Orthopedics;  Laterality: Left;  Local Anesthesia with IV Sedation  . FOOT SURGERY Right    bunion and broke toe and then realign toe  . MASTECTOMY Bilateral 08/12/2012   LEFT BREAST CANCER  . thoracic back surgery  05/2015  . VAGINAL HYSTERECTOMY  1999   Family History  Problem Relation Age of Onset  . Lung cancer Mother   . Lung cancer Father   . Colon cancer Neg Hx    Social History   Tobacco Use  . Smoking status: Former Smoker    Last attempt to quit: 05/24/2012    Years since quitting: 5.2  . Smokeless tobacco: Never Used  Substance Use Topics  . Alcohol use: Yes    Alcohol/week: 0.0 oz    Comment: socially  . Drug use: No   Current Outpatient Medications  Medication Sig Dispense Refill  . azaTHIOprine (IMURAN) 50 MG tablet Take 50 mg by mouth 2 (two) times daily.    . Cholecalciferol (HM VITAMIN D3) 4000 units CAPS Take by mouth.    . hydroxychloroquine (PLAQUENIL) 200 MG tablet Take 200 mg by mouth 2 (two) times daily.    Marland Kitchen venlafaxine XR (EFFEXOR-XR) 75 MG 24 hr capsule Take 225 mg by mouth daily.  4   No current facility-administered medications for this visit.    Allergies  Allergen Reactions  .  Oxycodone Anaphylaxis, Hives and Swelling    Whelps and swelling in throat  . Prednisone Nausea And Vomiting and Diarrhea    Can take IV steroids     Review of Systems: All systems reviewed and negative except where noted in HPI.    Physical Exam: BP 98/66   Pulse 84   Ht 5\' 3"  (1.6 m)   Wt 127 lb 12.8 oz (58 kg)   BMI 22.64 kg/m  Constitutional:  Well-developed, white female in no acute distress. Psychiatric: Normal mood and affect. Behavior is normal. EENT: Pupils normal.  Conjunctivae are normal. No scleral icterus. Neck supple.  Cardiovascular: Normal rate, regular rhythm. No edema Pulmonary/chest: Effort normal and breath sounds normal. No wheezing, rales or rhonchi. Abdominal: Soft, nondistended. Nontender. Bowel sounds active throughout. There are  no masses palpable. No hepatomegaly. Lymphadenopathy: No cervical adenopathy noted. Neurological: Alert and oriented to person place and time. Skin: Skin is warm and dry. No rashes noted.   ASSESSMENT AND PLAN:  1. 51 yo female with one week hx of constant LLQ pain / fevers  (Tmax 100 degrees). No urinary sx. She doesn't feel like pain related to constipation -CBC, BMET -No diverticular disease on colonoscopy April 2017 but will obtain CT scan of abd / pelvis to see if she has developed diverticular disease since last colonoscopy.    2. Unintentional weight loss. Her weight is down 10 pounds since Aprl 2017.   3. Lupus, last saw Rheumatology 2 weeks ago. On Imuran and plaquenil.   4. Depression. Patient feels depression is under control,  generally speaking.   5. Autoimmune Hepatitis per patient. Will request records from [place], NP  08/04/2017, 3:37 PM

## 2017-08-05 ENCOUNTER — Encounter: Payer: Self-pay | Admitting: Nurse Practitioner

## 2017-08-05 NOTE — Progress Notes (Signed)
Reviewed and agree with initial management plan.  [facility] Zaino T. Lennis Rader, MD FACG 

## 2017-08-13 ENCOUNTER — Ambulatory Visit (INDEPENDENT_AMBULATORY_CARE_PROVIDER_SITE_OTHER)
Admission: RE | Admit: 2017-08-13 | Discharge: 2017-08-13 | Disposition: A | Discharge status: Home / Self Care | Payer: 59 | Source: Ambulatory Visit | Attending: Nurse Practitioner | Admitting: Nurse Practitioner

## 2017-08-13 ENCOUNTER — Telehealth: Payer: Self-pay | Admitting: Nurse Practitioner

## 2017-08-13 DIAGNOSIS — R1032 Left lower quadrant pain: Secondary | ICD-10-CM | POA: Diagnosis not present

## 2017-08-13 MED ORDER — IOPAMIDOL (ISOVUE-300) INJECTION 61%
100.0000 mL | Freq: Once | INTRAVENOUS | Status: AC | PRN
  Administered 2017-08-13: 100 mL via INTRAVENOUS

## 2017-08-16 NOTE — Telephone Encounter (Signed)
See result note.  

## 2017-10-06 ENCOUNTER — Other Ambulatory Visit: Payer: Self-pay | Admitting: Family Medicine

## 2017-10-06 DIAGNOSIS — J189 Pneumonia, unspecified organism: Secondary | ICD-10-CM

## 2017-10-08 ENCOUNTER — Other Ambulatory Visit: Payer: 59

## 2017-10-12 ENCOUNTER — Other Ambulatory Visit: Payer: 59

## 2017-10-12 ENCOUNTER — Ambulatory Visit
Admission: RE | Admit: 2017-10-12 | Discharge: 2017-10-12 | Disposition: A | Discharge status: Home / Self Care | Payer: 59 | Source: Ambulatory Visit | Attending: Family Medicine | Admitting: Family Medicine

## 2017-10-12 DIAGNOSIS — J189 Pneumonia, unspecified organism: Secondary | ICD-10-CM

## 2017-10-13 ENCOUNTER — Other Ambulatory Visit (HOSPITAL_COMMUNITY): Payer: Self-pay | Admitting: Family Medicine

## 2017-10-13 DIAGNOSIS — D491 Neoplasm of unspecified behavior of respiratory system: Secondary | ICD-10-CM

## 2017-10-14 ENCOUNTER — Other Ambulatory Visit: Payer: Self-pay | Admitting: Family Medicine

## 2017-10-14 DIAGNOSIS — J189 Pneumonia, unspecified organism: Secondary | ICD-10-CM

## 2017-10-19 ENCOUNTER — Encounter (HOSPITAL_COMMUNITY): Payer: Self-pay

## 2017-10-19 ENCOUNTER — Ambulatory Visit (HOSPITAL_COMMUNITY)
Admission: RE | Admit: 2017-10-19 | Discharge: 2017-10-19 | Disposition: A | Discharge status: Home / Self Care | Payer: 59 | Source: Ambulatory Visit | Attending: Family Medicine | Admitting: Family Medicine

## 2017-10-19 DIAGNOSIS — J90 Pleural effusion, not elsewhere classified: Secondary | ICD-10-CM | POA: Insufficient documentation

## 2017-10-19 DIAGNOSIS — D491 Neoplasm of unspecified behavior of respiratory system: Secondary | ICD-10-CM | POA: Diagnosis present

## 2017-10-19 DIAGNOSIS — Z853 Personal history of malignant neoplasm of breast: Secondary | ICD-10-CM | POA: Insufficient documentation

## 2017-10-19 DIAGNOSIS — C7951 Secondary malignant neoplasm of bone: Secondary | ICD-10-CM | POA: Insufficient documentation

## 2017-10-19 DIAGNOSIS — I700 Atherosclerosis of aorta: Secondary | ICD-10-CM | POA: Insufficient documentation

## 2017-10-19 LAB — GLUCOSE, CAPILLARY: Glucose-Capillary: 100 mg/dL — ABNORMAL HIGH (ref 65–99)

## 2017-10-19 MED ORDER — FLUDEOXYGLUCOSE F - 18 (FDG) INJECTION
5.8700 | Freq: Once | INTRAVENOUS | Status: AC
  Administered 2017-10-19: 5.87 via INTRAVENOUS

## 2017-10-20 ENCOUNTER — Other Ambulatory Visit: Payer: Self-pay | Admitting: Medical Oncology

## 2017-10-20 ENCOUNTER — Other Ambulatory Visit: Payer: 59

## 2017-10-21 ENCOUNTER — Other Ambulatory Visit: Payer: Self-pay | Admitting: Oncology

## 2017-10-21 DIAGNOSIS — F32A Depression, unspecified: Secondary | ICD-10-CM | POA: Insufficient documentation

## 2017-10-21 DIAGNOSIS — C50919 Malignant neoplasm of unspecified site of unspecified female breast: Secondary | ICD-10-CM

## 2017-10-21 DIAGNOSIS — F329 Major depressive disorder, single episode, unspecified: Secondary | ICD-10-CM

## 2017-10-21 DIAGNOSIS — R918 Other nonspecific abnormal finding of lung field: Secondary | ICD-10-CM

## 2017-10-21 DIAGNOSIS — F419 Anxiety disorder, unspecified: Secondary | ICD-10-CM

## 2017-10-21 DIAGNOSIS — K754 Autoimmune hepatitis: Secondary | ICD-10-CM

## 2017-10-21 DIAGNOSIS — M797 Fibromyalgia: Secondary | ICD-10-CM

## 2017-10-21 DIAGNOSIS — M329 Systemic lupus erythematosus, unspecified: Secondary | ICD-10-CM

## 2017-10-21 DIAGNOSIS — G43909 Migraine, unspecified, not intractable, without status migrainosus: Secondary | ICD-10-CM

## 2017-10-22 ENCOUNTER — Other Ambulatory Visit: Payer: Self-pay

## 2017-10-22 ENCOUNTER — Telehealth: Payer: Self-pay | Admitting: Oncology

## 2017-10-22 ENCOUNTER — Encounter: Payer: Self-pay | Admitting: Oncology

## 2017-10-22 ENCOUNTER — Inpatient Hospital Stay: Payer: 59

## 2017-10-22 ENCOUNTER — Encounter: Payer: Self-pay | Admitting: Radiation Oncology

## 2017-10-22 ENCOUNTER — Inpatient Hospital Stay: Payer: 59 | Attending: Oncology | Admitting: Oncology

## 2017-10-22 DIAGNOSIS — Z9223 Personal history of estrogen therapy: Secondary | ICD-10-CM | POA: Diagnosis not present

## 2017-10-22 DIAGNOSIS — C7951 Secondary malignant neoplasm of bone: Secondary | ICD-10-CM | POA: Insufficient documentation

## 2017-10-22 DIAGNOSIS — R110 Nausea: Secondary | ICD-10-CM | POA: Insufficient documentation

## 2017-10-22 DIAGNOSIS — R918 Other nonspecific abnormal finding of lung field: Secondary | ICD-10-CM | POA: Insufficient documentation

## 2017-10-22 DIAGNOSIS — Z9013 Acquired absence of bilateral breasts and nipples: Secondary | ICD-10-CM | POA: Diagnosis not present

## 2017-10-22 DIAGNOSIS — Z923 Personal history of irradiation: Secondary | ICD-10-CM | POA: Diagnosis not present

## 2017-10-22 DIAGNOSIS — C7931 Secondary malignant neoplasm of brain: Secondary | ICD-10-CM | POA: Diagnosis not present

## 2017-10-22 DIAGNOSIS — Z170 Estrogen receptor positive status [ER+]: Secondary | ICD-10-CM | POA: Diagnosis not present

## 2017-10-22 DIAGNOSIS — C342 Malignant neoplasm of middle lobe, bronchus or lung: Secondary | ICD-10-CM | POA: Insufficient documentation

## 2017-10-22 DIAGNOSIS — K754 Autoimmune hepatitis: Secondary | ICD-10-CM | POA: Insufficient documentation

## 2017-10-22 DIAGNOSIS — Z853 Personal history of malignant neoplasm of breast: Secondary | ICD-10-CM | POA: Insufficient documentation

## 2017-10-22 DIAGNOSIS — Z79899 Other long term (current) drug therapy: Secondary | ICD-10-CM | POA: Insufficient documentation

## 2017-10-22 DIAGNOSIS — M069 Rheumatoid arthritis, unspecified: Secondary | ICD-10-CM | POA: Insufficient documentation

## 2017-10-22 DIAGNOSIS — C771 Secondary and unspecified malignant neoplasm of intrathoracic lymph nodes: Secondary | ICD-10-CM | POA: Insufficient documentation

## 2017-10-22 LAB — CMP (CANCER CENTER ONLY)
ALK PHOS: 158 U/L — AB (ref 40–150)
ALT: 26 U/L (ref 0–55)
ANION GAP: 10 (ref 3–11)
AST: 45 U/L — ABNORMAL HIGH (ref 5–34)
Albumin: 3 g/dL — ABNORMAL LOW (ref 3.5–5.0)
BILIRUBIN TOTAL: 0.5 mg/dL (ref 0.2–1.2)
BUN: 6 mg/dL — ABNORMAL LOW (ref 7–26)
CALCIUM: 9.5 mg/dL (ref 8.4–10.4)
CO2: 25 mmol/L (ref 22–29)
CREATININE: 0.68 mg/dL (ref 0.60–1.10)
Chloride: 100 mmol/L (ref 98–109)
GFR, Est AFR Am: >60 mL/min (ref >60)
GFR, Estimated: >60 mL/min (ref >60)
GLUCOSE: 79 mg/dL (ref 70–140)
Potassium: 4.3 mmol/L (ref 3.5–5.1)
Sodium: 135 mmol/L — ABNORMAL LOW (ref 136–145)
TOTAL PROTEIN: 7.7 g/dL (ref 6.4–8.3)

## 2017-10-22 LAB — CBC WITH DIFFERENTIAL (CANCER CENTER ONLY)
Basophils Absolute: 0 10*3/uL (ref 0.0–0.1)
Basophils Relative: 0 %
Eosinophils Absolute: 0.2 10*3/uL (ref 0.0–0.5)
Eosinophils Relative: 3 %
HEMATOCRIT: 39 % (ref 34.8–46.6)
HEMOGLOBIN: 13.1 g/dL (ref 11.6–15.9)
LYMPHS ABS: 1.4 10*3/uL (ref 0.9–3.3)
Lymphocytes Relative: 21 %
MCH: 33.9 pg (ref 25.1–34.0)
MCHC: 33.6 g/dL (ref 31.5–36.0)
MCV: 100.8 fL (ref 79.5–101.0)
MONOS PCT: 14 %
Monocytes Absolute: 0.9 10*3/uL (ref 0.1–0.9)
NEUTROS ABS: 4.2 10*3/uL (ref 1.5–6.5)
NEUTROS PCT: 62 %
Platelet Count: 357 10*3/uL (ref 145–400)
RBC: 3.87 MIL/uL (ref 3.70–5.45)
RDW: 14.3 % (ref 11.2–14.5)
WBC Count: 6.8 10*3/uL (ref 3.9–10.3)

## 2017-10-22 NOTE — Progress Notes (Signed)
[facility] Initial Visit:  Patient Care Team: [name], MD as PCP - General (Family Medicine) Gavin Pound, MD as Consulting Physician (Rheumatology)  CHIEF COMPLAINT: Lung mass  HISTORY OF PRESENT ILLNESS: Diana Schwartz 52 y.o. female has a past medical history significant for left invasive lobular carcinoma diagnosed in 2013 status post bilateral mastectomy with reconstruction, lupus, autoimmune hepatitis, depression, anxiety, fibromyalgia, osteoarthritis, degenerative disc disease, rheumatoid arthritis, Sjoegren syndrome, and Raynauds.  The patient was complaining of not feeling well overall starting in September 2018 and then developed increased cough with bilateral pneumonia in November 2018.  Since November, she has been treated with several rounds of antibiotics without resolution of her symptoms she had a chest x-ray on 08/30/2017 which showed pneumonia.  Follow-up chest x-ray performed on 10/06/2017 showed a persistent infiltrate in the lingula and a CT of the chest was recommended.  CT of the chest was performed on 10/12/2017 showed a left middle lobe heterogeneous possibly necrotic soft tissue mass measuring up to 8.4 cm which was highly suspicious for malignancy.  Also noted on the CT scan was bulky left hilar lymphadenopathy and mediastinal lymphadenopathy.  A PET scan was subsequently ordered by her primary care provider which was performed on 10/19/2017 which showed a hypermetabolic lingular mass with cervical/thoracic nodal, left adrenal, and bone metastasis.  This is most likely consistent with a primary bronchogenic carcinoma.  Also noted on the PET scan with a tiny left pleural effusion, postobstructive pneumonitis and/or lymphangitic tumor spread, and hypoattenuation within the right frontal and temporal lobes which was suboptimally evaluated.  MRI of the brain is recommended for follow-up.  The patient was referred to the [facility] today for further  evaluation and recommendation regarding her condition.  When seen today the patient continues to complain of fatigue and weight loss.  She has lost about 15 pounds in the past 2 months.  She has a lack of appetite.  She denies fevers and chills.  She does report headaches which are happening on a daily basis.  She has not take any medication for her headaches.  Denies visual disturbances.  She also reports having some chest discomfort in the center of her chest that does not radiate.  She does have shortness of breath with minimal exertion and a productive cough.  Denies hemoptysis.  The patient denies nausea, vomiting, constipation, diarrhea.  She has multiple arthralgias due to her rheumatological conditions. Family history is significant for both parents with lung cancer hypertension in her father father had rheumatoid arthritis. The patient is married and has 1 daughter.  The patient is accompanied today by her husband, Ronalee Belts, and her daughter, Joellen Jersey.  The patient has a history of smoking tobacco only 1 pack/day for 25 years.  Patient quit in 2013.  She drinks alcohol socially and denies drug use.  Review of Systems  Constitutional: Positive for fatigue and unexpected weight change. Negative for chills and fever.       The patient has an approximate 15 pound weight loss over a 52-monthperiod of time.  HENT:   Negative for hearing loss, lump/mass, mouth sores, nosebleeds, sore throat, tinnitus and trouble swallowing.   Eyes: Negative for eye problems and icterus.  Respiratory: Positive for cough and shortness of breath. Negative for chest tightness, hemoptysis and wheezing.   Cardiovascular: Positive for chest pain. Negative for leg swelling and palpitations.       The patient has pain to her chest over the sternum.  Pain does not radiate.  Gastrointestinal: Negative for abdominal distention, abdominal pain, blood in stool, constipation, diarrhea, nausea and vomiting.  Endocrine: Negative.    Genitourinary: Negative for difficulty urinating, dysuria, frequency, hematuria and nocturia.   Musculoskeletal: Positive for arthralgias. Negative for back pain, flank pain, gait problem, neck pain and neck stiffness.       The patient has multiple arthralgias due to her lupus and rheumatoid arthritis.  Skin: Negative.   Neurological: Positive for headaches. Negative for dizziness, extremity weakness, gait problem, light-headedness, numbness, seizures and speech difficulty.  Hematological: Negative.   Psychiatric/Behavioral: Positive for depression. Negative for confusion, decreased concentration, sleep disturbance and suicidal ideas. The patient is nervous/anxious.     MEDICAL HISTORY: Past Medical History:  Diagnosis Date  . Anxiety   . Autoimmune hepatitis ([place])    no current med.  . Breast cancer [facility]) 2013   Left breast cancer. Treated at [facility]. Per notes, nvasive lobular carcinoma. S/P bilateral mastectomy with recontruction and Tamoxifen X5 years.   . Carpal tunnel syndrome of left wrist   . Degenerative disc disease, lumbar   . Depression   . Fibromyalgia   . Lupus (systemic lupus erythematosus) ([place])   . Osteoarthritis   . Rash    arms, chest - due to lupus  . Raynaud disease   . Rheumatoid arthritis ([place])   . Sjoegren syndrome ([place])     SURGICAL HISTORY: Past Surgical History:  Procedure Laterality Date  . ANTERIOR CERVICAL DECOMP/DISCECTOMY FUSION  02/06/2004   C5-6, C6-7  . AXILLARY NODE DISSECTION Left 08/2012  . BREAST RECONSTRUCTION  06/2013  . CARPAL TUNNEL RELEASE Right 1989  . CARPAL TUNNEL RELEASE Left 10/02/2013   Procedure: LEFT CARPAL TUNNEL RELEASE;  Surgeon: Linna Hoff, MD;  Location: [facility];  Service: Orthopedics;  Laterality: Left;  Local Anesthesia with IV Sedation  . FOOT SURGERY Right    bunion and broke toe and then realign toe  . MASTECTOMY Bilateral 08/12/2012   LEFT BREAST CANCER  . thoracic back surgery  05/2015  .  VAGINAL HYSTERECTOMY  1999    SOCIAL HISTORY: Social History   Socioeconomic History  . Marital status: Married    Spouse name: Ronalee Belts  . Number of children: 1  . Years of education: Not on file  . Highest education level: Not on file  Social Needs  . Financial resource strain: Not on file  . Food insecurity - worry: Not on file  . Food insecurity - inability: Not on file  . Transportation needs - medical: Not on file  . Transportation needs - non-medical: Not on file  Occupational History  . Occupation: disability  Tobacco Use  . Smoking status: Former Smoker    Last attempt to quit: 05/24/2012    Years since quitting: 5.4  . Smokeless tobacco: Never Used  Substance and Sexual Activity  . Alcohol use: Yes    Alcohol/week: 0.0 oz    Comment: socially  . Drug use: No  . Sexual activity: Not on file  Other Topics Concern  . Not on file  Social History Narrative  . Not on file    FAMILY HISTORY Family History  Problem Relation Age of Onset  . Lung cancer Mother   . Hypertension Mother   . Lung cancer Father   . Rheum arthritis Father   . Hypertension Father   . Colon cancer Neg Hx     ALLERGIES:  is allergic to oxycodone; doxycycline; and prednisone.  MEDICATIONS:  Current Outpatient Medications  Medication Sig Dispense Refill  . ALPRAZolam (XANAX) 0.5 MG tablet Take 0.5 mg by mouth at bedtime as needed for anxiety.    Marland Kitchen azaTHIOprine (IMURAN) 50 MG tablet Take 50 mg by mouth 2 (two) times daily.    . Cholecalciferol (HM VITAMIN D3) 4000 units CAPS Take by mouth.    . hydroxychloroquine (PLAQUENIL) 200 MG tablet Take 200 mg by mouth 2 (two) times daily.    Marland Kitchen venlafaxine XR (EFFEXOR-XR) 150 MG 24 hr capsule Take 1 capsule by mouth 2 (two) times daily.     No current facility-administered medications for this visit.     PHYSICAL EXAMINATION:  ECOG PERFORMANCE STATUS: 1 - Symptomatic but completely ambulatory   Vitals:   10/22/17 0809  BP: (!) 139/114   Pulse: 82  Resp: 17  Temp: 99 F (37.2 C)  SpO2: 100%    Filed Weights   10/22/17 0809  Weight: 119 lb 6.4 oz (54.2 kg)     Physical Exam  Constitutional: She is oriented to person, place, and time and well-developed, well-nourished, and in no distress. No distress.  HENT:  Head: Normocephalic and atraumatic.  Nose: Nose normal.  Mouth/Throat: Oropharynx is clear and moist. No oropharyngeal exudate.  Eyes: Conjunctivae and EOM are normal. Pupils are equal, round, and reactive to light. Right eye exhibits no discharge. Left eye exhibits no discharge. No scleral icterus.  Neck: Normal range of motion. Neck supple. No JVD present. No tracheal deviation present.  Cardiovascular: Normal rate, regular rhythm and intact distal pulses. Exam reveals no gallop and no friction rub.  No murmur heard. Pulmonary/Chest: Effort normal and breath sounds normal. No stridor. No respiratory distress. She has no wheezes. She has no rales.  Abdominal: Soft. Bowel sounds are normal. She exhibits no distension and no mass. There is no tenderness.  Musculoskeletal: Normal range of motion. She exhibits no edema.  Lymphadenopathy:    She has no cervical adenopathy.  Neurological: She is alert and oriented to person, place, and time. She displays normal reflexes. No cranial nerve deficit. She exhibits normal muscle tone. Gait normal. Coordination normal.  Skin: Skin is warm and dry. No rash noted. She is not diaphoretic. No erythema. No pallor.  Psychiatric: Mood, memory, affect and judgment normal.     LABORATORY DATA: I have personally reviewed the data as listed:  Appointment on 10/22/2017  Component Date Value Ref Range Status  . WBC Count 10/22/2017 6.8  3.9 - 10.3 K/uL Final  . RBC 10/22/2017 3.87  3.70 - 5.45 MIL/uL Final  . Hemoglobin 10/22/2017 13.1  11.6 - 15.9 g/dL Final  . HCT 10/22/2017 39.0  34.8 - 46.6 % Final  . MCV 10/22/2017 100.8  79.5 - 101.0 fL Final  . MCH 10/22/2017 33.9   25.1 - 34.0 pg Final  . MCHC 10/22/2017 33.6  31.5 - 36.0 g/dL Final  . RDW 10/22/2017 14.3  11.2 - 14.5 % Final  . Platelet Count 10/22/2017 357  145 - 400 K/uL Final  . Neutrophils Relative % 10/22/2017 62  % Final  . Neutro Abs 10/22/2017 4.2  1.5 - 6.5 K/uL Final  . Lymphocytes Relative 10/22/2017 21  % Final  . Lymphs Abs 10/22/2017 1.4  0.9 - 3.3 K/uL Final  . Monocytes Relative 10/22/2017 14  % Final  . Monocytes Absolute 10/22/2017 0.9  0.1 - 0.9 K/uL Final  . Eosinophils Relative 10/22/2017 3  % Final  . Eosinophils Absolute 10/22/2017 0.2  0.0 - 0.5 K/uL Final  . Basophils Relative 10/22/2017 0  % Final  . Basophils Absolute 10/22/2017 0.0  0.0 - 0.1 K/uL Final   Performed at [facility], [place] [address], [place], [place] [address]  . Sodium 10/22/2017 135* 136 - 145 mmol/L Final  . Potassium 10/22/2017 4.3  3.5 - 5.1 mmol/L Final  . Chloride 10/22/2017 100  98 - 109 mmol/L Final  . CO2 10/22/2017 25  22 - 29 mmol/L Final  . Glucose, Bld 10/22/2017 79  70 - 140 mg/dL Final  . BUN 10/22/2017 6* 7 - 26 mg/dL Final  . Creatinine 10/22/2017 0.68  0.60 - 1.10 mg/dL Final  . Calcium 10/22/2017 9.5  8.4 - 10.4 mg/dL Final  . Total Protein 10/22/2017 7.7  6.4 - 8.3 g/dL Final  . Albumin 10/22/2017 3.0* 3.5 - 5.0 g/dL Final  . AST 10/22/2017 45* 5 - 34 U/L Final  . ALT 10/22/2017 26  0 - 55 U/L Final  . Alkaline Phosphatase 10/22/2017 158* 40 - 150 U/L Final  . Total Bilirubin 10/22/2017 0.5  0.2 - 1.2 mg/dL Final  . GFR, Est Non Af Am 10/22/2017 >60  >60 mL/min Final  . GFR, Est AFR Am 10/22/2017 >60  >60 mL/min Final   Comment: (NOTE) The eGFR has been calculated using the CKD EPI equation. This calculation has not been validated in all clinical situations. eGFR's persistently <60 mL/min signify possible Chronic Kidney Disease.   Georgiann Hahn gap 10/22/2017 10  3 - 11 Final   Performed at [facility], [place] [address],  [place], [place] [address]  Hospital Outpatient Visit on 10/19/2017  Component Date Value Ref Range Status  . Glucose-Capillary 10/19/2017 100* 65 - 99 mg/dL Final    RADIOGRAPHIC STUDIES:  Nm Pet Image Initial (pi) Skull Base To Thigh  Result Date: 10/20/2017 CLINICAL DATA:  Initial treatment strategy for history of breast cancer. Left upper lobe/lingular mass. EXAM: NUCLEAR MEDICINE PET SKULL BASE TO THIGH TECHNIQUE: 5.9 mCi F-18 FDG was injected intravenously. Full-ring PET imaging was performed from the skull base to thigh after the radiotracer. CT data was obtained and used for attenuation correction and anatomic localization. FASTING BLOOD GLUCOSE:  Value: 100 mg/dl COMPARISON:  Chest CT of 10/12/2017.  Abdominal CT of 08/13/2017. FINDINGS: Mediastinal blood pool activity: SUV max equal 2.2 NECK: Low left jugular/supraclavicular tiny node corresponds to hypermetabolism. This measures 4 mm and a S.U.V. max of 3.4 on image 39/4. Incidental CT findings: No cervical adenopathy. Right frontal and temporal lobe hypoattenuation, including on image 1/4. Suboptimally evaluated. Nonspecific right thyroid hypoattenuating nodule of 10 mm. CHEST: Centrally necrotic lingular primary measures on the order of 5.8 x 5.7 cm and a S.U.V. max of 13.9 on image 35/8. Surrounding ground-glass and airspace disease is favored to represent postobstructive pneumonitis versus lymphangitic tumor spread. Tiny left pleural effusion demonstrates nonspecific low-level hypermetabolism. Bilateral mediastinal nodal metastasis, including a right paratracheal node which measures 1.9 cm and a S.U.V. max of 18.1 on image 62/4. Incidental CT findings: Bilateral breast implants. Left axillary node dissection. Nonspecific bilateral pulmonary nodules, below PET resolution. ABDOMEN/PELVIS: Hypermetabolic left adrenal nodule measures 2.4 cm and a S.U.V. max of 11.5 on image 107/4. No other abnormal soft tissue hypermetabolism identified. Incidental CT  findings: Abdominal aortic atherosclerosis. Hysterectomy. SKELETON: Left humeral head lesion measures 8 mm and a S.U.V. max of 7.1 on image 38/4. A left acetabular lesion is CT occult and measures a  S.U.V. max of 5.9. Incidental CT findings: Lumbar spine fixation. Cervical spine fixation. IMPRESSION: 1. Hypermetabolic lingular mass with cervical/thoracic nodal, left adrenal, and bone metastasis. Most consistent with primary bronchogenic carcinoma. 2. Tiny left pleural effusion with nonspecific low-level hypermetabolism. 3. Postobstructive pneumonitis and/or lymphangitic tumor spread more peripheral within the lingula. 4. Hypoattenuation within the right frontal and temporal lobes is suboptimally evaluated. 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 If not previously evaluated, consider further evaluation with pre and post-contrast brain MR. 5.  Aortic Atherosclerosis (ICD10-I70.0). Electronically Signed   By: Abigail Miyamoto M.D.   On: 10/20/2017 08:43    ASSESSMENT: This is a very pleasant 52 year old white female with highly suspicious metastatic lung cancer who presented with fatigue, weight loss, and a 24-monthhistory of cough .  Tissue diagnosis is pending.   PLAN: The patient was seen with Dr. MJulien Nordmann  He had a lengthy discussion with the patient and her family today about her current condition and further investigation to confirm diagnosis and treatment options.  Dr. MJulien Nordmannindependently reviewed the scan images with the patient and her family. An MRI of the brain has been ordered to complete her staging workup.  For tissue diagnosis a CT-guided core biopsy of the hypermetabolic lingular mass has been ordered to be done by interventional radiology.  We will consider sending the tissue biopsy for molecular studies by foundation 1 as well as PDL 1 expression if consistent with adenocarcinoma the lung. Referral has been placed to radiation oncology for consideration of palliative radiation to her lung  mass.  We will schedule follow-up for the patient once we have her MRI of the brain and biopsy results for reevaluation and a more detailed discussion of her treatment options based on final staging workup and tissue diagnosis.  The patient was advised to call immediately if she has any concerning symptoms in the interval.  The patient voices understanding of her current disease status and treatment options and is in agreement with the current care plan.  All questions were answered.  The patient knows to call the clinic with any problems, questions, or concerns.  We can certainly see the patient much sooner if necessary.  Thank you so much for allowing me to participate in the care of Markesha Tardiff.  I will continue to follow the patient with you and assist in her care.   Orders Placed This Encounter  Procedures  . MR BRAIN W WO CONTRAST    Standing Status:   Future    Standing Expiration Date:   10/23/2018    Order Specific Question:   If indicated for the ordered procedure, I authorize the administration of contrast media per Radiology protocol    Answer:   Yes    Order Specific Question:   What is the patient's sedation requirement?    Answer:   No Sedation    Order Specific Question:   Does the patient have a pacemaker or implanted devices?    Answer:   No    Order Specific Question:   Radiology Contrast Protocol - do NOT remove file path    Answer:   [email or web address]    Order Specific Question:   Reason for Exam additional comments    Answer:   Stage IV lung cancer. Evaluate for brain mets. Pt has headaches    Order Specific Question:   [facility]    [facility]  . CT BIOPSY    Standing  Status:   Future    Standing Expiration Date:   01/22/2019    Order Specific Question:   Reason for exam:    Answer:   Hypermetabolic lingular mass. Highly suspicious for lung cancer. Need tissue diagnosis.     Order Specific Question:   Preferred imaging location?    Answer:   [facility]  . Ambulatory referral to Radiation Oncology    Referral Priority:   Routine    Referral Type:   Consultation    Referral Reason:   Specialty Services Required    Requested Specialty:   Radiation Oncology    Number of Visits Requested:   1     Mikey Bussing, NP  10/22/2017 10:17 AM   ADDENDUM: Hematology/Oncology Attending: I had a face-to-face encounter with the patient.  I recommended her care plan.  This is a very pleasant 52 years old white female with long history for smoking as well as several autoimmune disorders.  The patient also has a history of lung cancer status post bilateral mastectomy in 2013 with reconstruction.  She mentioned that since November 2018 she has been complaining of increasing cough as well as shortness of breath.  She was diagnosed with bilateral pneumonia and treated with several courses of antibiotics.  She has no improvement in her symptoms and chest x-ray performed on August 30, 2017 was consistent with pneumonia.  Repeat chest x-ray on 10/07/2011 showed persistent infiltration in the lingula. CT scan of the chest was performed on 10/12/2017 and it showed left middle lung heterogeneous and possibly necrotic soft tissue mass measuring up to 8.4 cm highly suspicious for malignancy.  The mass abuts the mediastinal border/pericardium medially and the pleural service A.  There was suspicious of pleural extension of the tumor as well as lymphangitic spread of the malignancy and the rest of the lung parenchyma of the lingula and portion of the left upper lobe.  There was also bulky left hilar lymphadenopathy as well as mediastinal lymphadenopathy.  The scan also showed few subcentimeter solid and groundglass nodules in the right upper lobe with indeterminate significance in addition to 1.5 cm right thyroid mass.  This was followed by a PET scan on 10/19/2017 and that showed hypermetabolic  lingular mass with cervical/thoracic nodal, and left adrenal and bone metastasis most consistent with primary bronchogenic carcinoma.  There was also a tiny left pleural effusion that was nonspecific and postobstructive pneumonitis/lymphangitic tumor spread more peripheral within the lingula.  There was also hypoattenuation within the right frontal and temporal lobes suboptimally evaluated. The patient was referred to me today for evaluation and recommendation regarding treatment of her condition. When seen today she is feeling fine but she continues to have the shortness of breath as well as persistent headache. I had a lengthy discussion with the patient and her family about her current condition and treatment options.  I reviewed the scan images with them. I recommended for the patient to complete the staging workup by ordering MRI of the brain to rule out brain metastasis especially with her persistent headache. I also urgently referred the patient to radiation oncology for consideration of palliative radiotherapy to the enlarging obstructive left lung mass. I would also refer the patient to interventional radiology for consideration of CT-guided core biopsy of the left lung mass.  If they decline would refer the patient to cardiothoracic surgery or pulmonology for consideration of bronchoscopy and biopsy. I will arrange for the patient to come back for follow-up visit in 1  week for reevaluation and more detailed discussion of her treatment options based on the final pathology and staging workup. She was advised to call immediately if she has any concerning symptoms in the interval.  Disclaimer: This note was dictated with voice recognition software. Similar sounding words can inadvertently be transcribed and may be missed upon review. Eilleen Kempf, MD 10/24/17

## 2017-10-22 NOTE — Telephone Encounter (Signed)
Per 3/1 los - MRI of the brain and CT guided biopsy by interventional radiology as soon as possible.  Referral to radiation oncology.  Follow-up to be scheduled once we have her results of her MRI of the brain and biopsy.     No f/u scheduled yet . Radiation onc. To contact patient . And [facility] to contact patient with scans.Marland Kitchen

## 2017-10-23 ENCOUNTER — Ambulatory Visit (HOSPITAL_COMMUNITY)
Admission: RE | Admit: 2017-10-23 | Discharge: 2017-10-23 | Disposition: A | Discharge status: Home / Self Care | Payer: 59 | Source: Ambulatory Visit | Attending: Oncology | Admitting: Oncology

## 2017-10-23 ENCOUNTER — Encounter (HOSPITAL_COMMUNITY): Payer: Self-pay | Admitting: Emergency Medicine

## 2017-10-23 ENCOUNTER — Other Ambulatory Visit: Payer: Self-pay

## 2017-10-23 ENCOUNTER — Inpatient Hospital Stay (HOSPITAL_COMMUNITY)
Admission: EM | Admit: 2017-10-23 | Discharge: 2017-10-28 | DRG: 987 | Disposition: A | Discharge status: Home / Self Care | Payer: 59 | Attending: Internal Medicine | Admitting: Internal Medicine

## 2017-10-23 ENCOUNTER — Telehealth: Payer: Self-pay | Admitting: Hematology

## 2017-10-23 DIAGNOSIS — F329 Major depressive disorder, single episode, unspecified: Secondary | ICD-10-CM | POA: Diagnosis present

## 2017-10-23 DIAGNOSIS — C7931 Secondary malignant neoplasm of brain: Secondary | ICD-10-CM | POA: Diagnosis not present

## 2017-10-23 DIAGNOSIS — Z79899 Other long term (current) drug therapy: Secondary | ICD-10-CM

## 2017-10-23 DIAGNOSIS — Z6822 Body mass index (BMI) 22.0-22.9, adult: Secondary | ICD-10-CM

## 2017-10-23 DIAGNOSIS — M069 Rheumatoid arthritis, unspecified: Secondary | ICD-10-CM | POA: Diagnosis present

## 2017-10-23 DIAGNOSIS — Z853 Personal history of malignant neoplasm of breast: Secondary | ICD-10-CM | POA: Diagnosis not present

## 2017-10-23 DIAGNOSIS — D509 Iron deficiency anemia, unspecified: Secondary | ICD-10-CM | POA: Diagnosis present

## 2017-10-23 DIAGNOSIS — C771 Secondary and unspecified malignant neoplasm of intrathoracic lymph nodes: Secondary | ICD-10-CM | POA: Diagnosis present

## 2017-10-23 DIAGNOSIS — Z981 Arthrodesis status: Secondary | ICD-10-CM

## 2017-10-23 DIAGNOSIS — Z8261 Family history of arthritis: Secondary | ICD-10-CM

## 2017-10-23 DIAGNOSIS — E876 Hypokalemia: Secondary | ICD-10-CM | POA: Diagnosis present

## 2017-10-23 DIAGNOSIS — Z23 Encounter for immunization: Secondary | ICD-10-CM | POA: Diagnosis not present

## 2017-10-23 DIAGNOSIS — M329 Systemic lupus erythematosus, unspecified: Secondary | ICD-10-CM | POA: Diagnosis present

## 2017-10-23 DIAGNOSIS — R590 Localized enlarged lymph nodes: Secondary | ICD-10-CM | POA: Diagnosis present

## 2017-10-23 DIAGNOSIS — E440 Moderate protein-calorie malnutrition: Secondary | ICD-10-CM

## 2017-10-23 DIAGNOSIS — F419 Anxiety disorder, unspecified: Secondary | ICD-10-CM | POA: Diagnosis present

## 2017-10-23 DIAGNOSIS — Z9889 Other specified postprocedural states: Secondary | ICD-10-CM | POA: Diagnosis not present

## 2017-10-23 DIAGNOSIS — R918 Other nonspecific abnormal finding of lung field: Secondary | ICD-10-CM | POA: Diagnosis present

## 2017-10-23 DIAGNOSIS — M797 Fibromyalgia: Secondary | ICD-10-CM | POA: Diagnosis present

## 2017-10-23 DIAGNOSIS — Z9013 Acquired absence of bilateral breasts and nipples: Secondary | ICD-10-CM | POA: Diagnosis not present

## 2017-10-23 DIAGNOSIS — C801 Malignant (primary) neoplasm, unspecified: Secondary | ICD-10-CM

## 2017-10-23 DIAGNOSIS — K754 Autoimmune hepatitis: Secondary | ICD-10-CM | POA: Diagnosis present

## 2017-10-23 DIAGNOSIS — F32A Depression, unspecified: Secondary | ICD-10-CM | POA: Diagnosis present

## 2017-10-23 DIAGNOSIS — M3500 Sicca syndrome, unspecified: Secondary | ICD-10-CM | POA: Diagnosis present

## 2017-10-23 DIAGNOSIS — Z8249 Family history of ischemic heart disease and other diseases of the circulatory system: Secondary | ICD-10-CM

## 2017-10-23 DIAGNOSIS — Z888 Allergy status to other drugs, medicaments and biological substances status: Secondary | ICD-10-CM

## 2017-10-23 DIAGNOSIS — D539 Nutritional anemia, unspecified: Secondary | ICD-10-CM | POA: Diagnosis present

## 2017-10-23 DIAGNOSIS — I7300 Raynaud's syndrome without gangrene: Secondary | ICD-10-CM | POA: Diagnosis present

## 2017-10-23 DIAGNOSIS — C50919 Malignant neoplasm of unspecified site of unspecified female breast: Secondary | ICD-10-CM | POA: Diagnosis not present

## 2017-10-23 DIAGNOSIS — G936 Cerebral edema: Secondary | ICD-10-CM | POA: Diagnosis present

## 2017-10-23 DIAGNOSIS — K5900 Constipation, unspecified: Secondary | ICD-10-CM | POA: Diagnosis present

## 2017-10-23 DIAGNOSIS — C7951 Secondary malignant neoplasm of bone: Secondary | ICD-10-CM | POA: Diagnosis present

## 2017-10-23 DIAGNOSIS — Z87891 Personal history of nicotine dependence: Secondary | ICD-10-CM

## 2017-10-23 DIAGNOSIS — IMO0002 Reserved for concepts with insufficient information to code with codable children: Secondary | ICD-10-CM | POA: Diagnosis present

## 2017-10-23 DIAGNOSIS — R748 Abnormal levels of other serum enzymes: Secondary | ICD-10-CM | POA: Diagnosis not present

## 2017-10-23 DIAGNOSIS — R2681 Unsteadiness on feet: Secondary | ICD-10-CM | POA: Diagnosis present

## 2017-10-23 DIAGNOSIS — Z881 Allergy status to other antibiotic agents status: Secondary | ICD-10-CM

## 2017-10-23 DIAGNOSIS — Z90710 Acquired absence of both cervix and uterus: Secondary | ICD-10-CM

## 2017-10-23 DIAGNOSIS — L930 Discoid lupus erythematosus: Secondary | ICD-10-CM | POA: Diagnosis not present

## 2017-10-23 DIAGNOSIS — Z885 Allergy status to narcotic agent status: Secondary | ICD-10-CM

## 2017-10-23 DIAGNOSIS — C7972 Secondary malignant neoplasm of left adrenal gland: Secondary | ICD-10-CM | POA: Diagnosis not present

## 2017-10-23 DIAGNOSIS — Z801 Family history of malignant neoplasm of trachea, bronchus and lung: Secondary | ICD-10-CM

## 2017-10-23 DIAGNOSIS — C3490 Malignant neoplasm of unspecified part of unspecified bronchus or lung: Secondary | ICD-10-CM | POA: Diagnosis not present

## 2017-10-23 LAB — CBC WITH DIFFERENTIAL/PLATELET
BASOS ABS: 0 10*3/uL (ref 0.0–0.1)
BASOS PCT: 1 %
EOS ABS: 0.3 10*3/uL (ref 0.0–0.7)
EOS PCT: 5 %
HCT: 37.8 % (ref 36.0–46.0)
Hemoglobin: 12.7 g/dL (ref 12.0–15.0)
Lymphocytes Relative: 19 %
Lymphs Abs: 1 10*3/uL (ref 0.7–4.0)
MCH: 34 pg (ref 26.0–34.0)
MCHC: 33.6 g/dL (ref 30.0–36.0)
MCV: 101.3 fL — ABNORMAL HIGH (ref 78.0–100.0)
MONO ABS: 0.9 10*3/uL (ref 0.1–1.0)
Monocytes Relative: 16 %
Neutro Abs: 3.3 10*3/uL (ref 1.7–7.7)
Neutrophils Relative %: 61 %
PLATELETS: 370 10*3/uL (ref 150–400)
RBC: 3.73 MIL/uL — AB (ref 3.87–5.11)
RDW: 14.2 % (ref 11.5–15.5)
WBC: 5.5 10*3/uL (ref 4.0–10.5)

## 2017-10-23 LAB — BASIC METABOLIC PANEL
Anion gap: 10 (ref 5–15)
BUN: 10 mg/dL (ref 6–20)
CHLORIDE: 101 mmol/L (ref 101–111)
CO2: 27 mmol/L (ref 22–32)
CREATININE: 0.7 mg/dL (ref 0.44–1.00)
Calcium: 8.9 mg/dL (ref 8.9–10.3)
GFR calc Af Amer: >60 mL/min (ref >60)
GFR calc non Af Amer: >60 mL/min (ref >60)
Glucose, Bld: 88 mg/dL (ref 65–99)
Potassium: 3.3 mmol/L — ABNORMAL LOW (ref 3.5–5.1)
Sodium: 138 mmol/L (ref 135–145)

## 2017-10-23 LAB — GLUCOSE, CAPILLARY: GLUCOSE-CAPILLARY: 197 mg/dL — AB (ref 65–99)

## 2017-10-23 MED ORDER — ACETAMINOPHEN 325 MG PO TABS: 650.0000 mg | ORAL_TABLET | Freq: Three times a day (TID) | ORAL | Status: DC | PRN

## 2017-10-23 MED ORDER — INFLUENZA VAC SPLIT QUAD 0.5 ML IM SUSY
0.5000 mL | PREFILLED_SYRINGE | INTRAMUSCULAR | Status: AC
  Administered 2017-10-25: 0.5 mL via INTRAMUSCULAR
  Filled 2017-10-23: qty 0.5

## 2017-10-23 MED ORDER — DIPHENHYDRAMINE HCL 50 MG/ML IJ SOLN
25.0000 mg | Freq: Once | INTRAMUSCULAR | Status: AC
  Administered 2017-10-23: 25 mg via INTRAVENOUS
  Filled 2017-10-23: qty 1

## 2017-10-23 MED ORDER — POTASSIUM CHLORIDE CRYS ER 20 MEQ PO TBCR
20.0000 meq | EXTENDED_RELEASE_TABLET | Freq: Three times a day (TID) | ORAL | Status: AC
  Administered 2017-10-23 – 2017-10-24 (×3): 20 meq via ORAL
  Filled 2017-10-23 (×3): qty 1

## 2017-10-23 MED ORDER — SODIUM CHLORIDE 0.9 % IV SOLN
INTRAVENOUS | Status: DC
  Administered 2017-10-23 – 2017-10-26 (×4): via INTRAVENOUS

## 2017-10-23 MED ORDER — DEXAMETHASONE SODIUM PHOSPHATE 4 MG/ML IJ SOLN
4.0000 mg | Freq: Four times a day (QID) | INTRAMUSCULAR | Status: DC
  Administered 2017-10-23 – 2017-10-24 (×2): 4 mg via INTRAVENOUS
  Filled 2017-10-23 (×2): qty 1

## 2017-10-23 MED ORDER — ONDANSETRON HCL 4 MG/2ML IJ SOLN: 4.0000 mg | Freq: Once | INTRAMUSCULAR | Status: DC

## 2017-10-23 MED ORDER — BISACODYL 10 MG RE SUPP: 10.0000 mg | Freq: Every day | RECTAL | Status: DC | PRN

## 2017-10-23 MED ORDER — ALPRAZOLAM 0.5 MG PO TABS
0.5000 mg | ORAL_TABLET | Freq: Every evening | ORAL | Status: DC | PRN
  Administered 2017-10-24 – 2017-10-26 (×3): 0.5 mg via ORAL
  Filled 2017-10-23 (×3): qty 1

## 2017-10-23 MED ORDER — GADOBENATE DIMEGLUMINE 529 MG/ML IV SOLN
10.0000 mL | Freq: Once | INTRAVENOUS | Status: AC | PRN
  Administered 2017-10-23: 10 mL via INTRAVENOUS

## 2017-10-23 MED ORDER — ONDANSETRON HCL 4 MG PO TABS: 4.0000 mg | ORAL_TABLET | Freq: Four times a day (QID) | ORAL | Status: DC | PRN

## 2017-10-23 MED ORDER — CHOLECALCIFEROL 100 MCG (4000 UT) PO CAPS: ORAL_CAPSULE | Freq: Every morning | ORAL | Status: DC

## 2017-10-23 MED ORDER — HYDROXYCHLOROQUINE SULFATE 200 MG PO TABS
200.0000 mg | ORAL_TABLET | Freq: Two times a day (BID) | ORAL | Status: DC
  Administered 2017-10-23 – 2017-10-28 (×9): 200 mg via ORAL
  Filled 2017-10-23 (×12): qty 1

## 2017-10-23 MED ORDER — PROCHLORPERAZINE EDISYLATE 5 MG/ML IJ SOLN
10.0000 mg | Freq: Once | INTRAMUSCULAR | Status: AC
  Administered 2017-10-23: 10 mg via INTRAVENOUS
  Filled 2017-10-23: qty 2

## 2017-10-23 MED ORDER — SODIUM CHLORIDE 0.9 % IV SOLN
Freq: Once | INTRAVENOUS | Status: AC
  Administered 2017-10-23: 16:00:00 via INTRAVENOUS

## 2017-10-23 MED ORDER — VITAMIN D3 25 MCG (1000 UNIT) PO TABS
1000.0000 [IU] | ORAL_TABLET | Freq: Every day | ORAL | Status: DC
  Administered 2017-10-24 – 2017-10-28 (×4): 1000 [IU] via ORAL
  Filled 2017-10-23 (×4): qty 1

## 2017-10-23 MED ORDER — AZATHIOPRINE 50 MG PO TABS
50.0000 mg | ORAL_TABLET | Freq: Two times a day (BID) | ORAL | Status: DC
  Administered 2017-10-23 – 2017-10-28 (×9): 50 mg via ORAL
  Filled 2017-10-23 (×12): qty 1

## 2017-10-23 MED ORDER — DEXAMETHASONE SODIUM PHOSPHATE 10 MG/ML IJ SOLN
10.0000 mg | Freq: Once | INTRAMUSCULAR | Status: AC
  Administered 2017-10-23: 10 mg via INTRAVENOUS
  Filled 2017-10-23: qty 1

## 2017-10-23 MED ORDER — TRAMADOL HCL 50 MG PO TABS: 50.0000 mg | ORAL_TABLET | Freq: Four times a day (QID) | ORAL | Status: DC | PRN

## 2017-10-23 MED ORDER — ONDANSETRON HCL 4 MG/2ML IJ SOLN: 4.0000 mg | Freq: Four times a day (QID) | INTRAMUSCULAR | Status: DC | PRN

## 2017-10-23 MED ORDER — VENLAFAXINE HCL ER 150 MG PO CP24
300.0000 mg | ORAL_CAPSULE | Freq: Every day | ORAL | Status: DC
  Administered 2017-10-23 – 2017-10-28 (×6): 300 mg via ORAL
  Filled 2017-10-23 (×6): qty 2

## 2017-10-23 NOTE — H&P (Signed)
History and Physical    Diana Schwartz ZOX:[id number] DOB: 02/28/1966 DOA: 10/23/2017  Referring MD/NP/PA:  PCP: [name], MD  Outpatient Specialists: Oncology, Dr. Earlie Server Patient coming from: Home  Chief Complaint: Unsteady gait HPI: Diana Schwartz is a 52 y.o. female with medical history significant for but not limited to left invasive lobular carcinoma diagnosed in 2013 status post bilateral mastectomy with reconstruction was seen by oncologist to the ED to admit her for IV steroids on account of recent MRI abnormality indicating brain lesions with echogenic edema and mild midline shift without evidence of impending herniation or hydrocephalus. She reports several weeks history of fatigue with intermittent headaches and recently noted unstable gait for which reason her oncologist ordered an MRI which came back abnormal and recommended she consider ED for admission for IV steroids and relation oncology evaluation on Monday    ED Course: at emergency room patient was hemodynamically stable and IV dexamethasone 1 given,and hospitalist consulted for admission for plans for radiation oncology evaluation on Monday," this recommendation (Dr. Irene Limbo)  Review of Systems: As per HPI otherwise 10 point review of systems negative.   Past Medical History:  Diagnosis Date  . Anxiety   . Autoimmune hepatitis ([place])    no current med.  . Breast cancer [facility]) 2013   Left breast cancer. Treated at [facility]. Per notes, nvasive lobular carcinoma. S/P bilateral mastectomy with recontruction and Tamoxifen X5 years.   . Carpal tunnel syndrome of left wrist   . Degenerative disc disease, lumbar   . Depression   . Fibromyalgia   . Lupus (systemic lupus erythematosus) ([place])   . Osteoarthritis   . Rash    arms, chest - due to lupus  . Raynaud disease   . Rheumatoid arthritis ([place])   . Sjoegren syndrome [facility])     Past Surgical History:  Procedure Laterality Date  . ANTERIOR CERVICAL  DECOMP/DISCECTOMY FUSION  02/06/2004   C5-6, C6-7  . AXILLARY NODE DISSECTION Left 08/2012  . BREAST RECONSTRUCTION  06/2013  . CARPAL TUNNEL RELEASE Right 1989  . CARPAL TUNNEL RELEASE Left 10/02/2013   Procedure: LEFT CARPAL TUNNEL RELEASE;  Surgeon: Linna Hoff, MD;  Location: [facility];  Service: Orthopedics;  Laterality: Left;  Local Anesthesia with IV Sedation  . FOOT SURGERY Right    bunion and broke toe and then realign toe  . MASTECTOMY Bilateral 08/12/2012   LEFT BREAST CANCER  . thoracic back surgery  05/2015  . VAGINAL HYSTERECTOMY  1999     reports that she quit smoking about 5 years ago. she has never used smokeless tobacco. She reports that she drinks alcohol. She reports that she does not use drugs.  Allergies  Allergen Reactions  . Oxycodone Anaphylaxis, Hives and Swelling    Whelps and swelling in throat  . Doxycycline Nausea And Vomiting  . Prednisone Nausea And Vomiting and Diarrhea    Can take IV steroids    Family History  Problem Relation Age of Onset  . Lung cancer Mother   . Hypertension Mother   . Lung cancer Father   . Rheum arthritis Father   . Hypertension Father   . Colon cancer Neg Hx     Prior to Admission medications   Medication Sig Start Date End Date Taking? Authorizing Provider  acetaminophen (TYLENOL) 500 MG tablet Take 1,000 mg by mouth every 6 (six) hours as needed.   Yes [provider]  ALPRAZolam Duanne Moron) 0.5 MG tablet Take  0.5 mg by mouth at bedtime as needed for anxiety.   Yes [provider]  azaTHIOprine (IMURAN) 50 MG tablet Take 50 mg by mouth 2 (two) times daily.   Yes [provider]  Cholecalciferol (HM VITAMIN D3) 4000 units CAPS Take by mouth.   Yes [provider]  hydroxychloroquine (PLAQUENIL) 200 MG tablet Take 200 mg by mouth 2 (two) times daily.   Yes [provider]  venlafaxine XR (EFFEXOR-XR) 150 MG 24 hr capsule Take 2 capsules by mouth daily with  breakfast.  10/20/17  Yes [provider]    Physical Exam: Vitals:   10/23/17 1343  BP: 98/68  Pulse: 94  Resp: 18  Temp: 98.1 F (36.7 C)  TempSrc: Oral  SpO2: 97%      Constitutional: NAD, calm, comfortable Vitals:   10/23/17 1343  BP: 98/68  Pulse: 94  Resp: 18  Temp: 98.1 F (36.7 C)  TempSrc: Oral  SpO2: 97%   Eyes: PERRL, lids and conjunctivae normal ENMT: Mucous membranes are moist. Posterior pharynx clear of any exudate or lesions.Normal dentition.  Neck: normal, supple, no masses, no thyromegaly Respiratory: clear to auscultation bilaterally, no wheezing, no crackles. Normal respiratory effort. No accessory muscle use.  Cardiovascular: Regular rate and rhythm, no murmurs / rubs / gallops. No extremity edema. 2+ pedal pulses. No carotid bruits.  Abdomen: no tenderness, no masses palpated. No hepatosplenomegaly. Bowel sounds positive.  Musculoskeletal: no clubbing / cyanosis. No joint deformity upper and lower extremities. Good ROM, no contractures. Normal muscle tone.  Skin: no rashes, lesions, ulcers. No induration Neurologic:  Alert and responsive, mildly stable gait  Psychiatric: Normal judgment and insight. Alert and oriented x 3. Normal mood.     Labs on Admission: I have personally reviewed following labs and imaging studies  CBC: Recent Labs  Lab 10/22/17 0747 10/23/17 1502  WBC 6.8 5.5  NEUTROABS 4.2 3.3  HGB  --  12.7  HCT 39.0 37.8  MCV 100.8 101.3*  PLT 357 604   Basic Metabolic Panel: Recent Labs  Lab 10/22/17 0747 10/23/17 1502  NA 135* 138  K 4.3 3.3*  CL 100 101  CO2 25 27  GLUCOSE 79 88  BUN 6* 10  CREATININE 0.68 0.70  CALCIUM 9.5 8.9   GFR: Estimated Creatinine Clearance: 68 mL/min (by C-G formula based on SCr of 0.7 mg/dL). Liver Function Tests: Recent Labs  Lab 10/22/17 0747  AST 45*  ALT 26  ALKPHOS 158*  BILITOT 0.5  PROT 7.7  ALBUMIN 3.0*   No results for input(s): LIPASE, AMYLASE in the last  168 hours. No results for input(s): AMMONIA in the last 168 hours. Coagulation Profile: No results for input(s): INR, PROTIME in the last 168 hours. Cardiac Enzymes: No results for input(s): CKTOTAL, CKMB, CKMBINDEX, TROPONINI in the last 168 hours. BNP (last 3 results) No results for input(s): PROBNP in the last 8760 hours. HbA1C: No results for input(s): HGBA1C in the last 72 hours. CBG: Recent Labs  Lab 10/19/17 1247  GLUCAP 100*   Lipid Profile: No results for input(s): CHOL, HDL, LDLCALC, TRIG, CHOLHDL, LDLDIRECT in the last 72 hours. Thyroid Function Tests: No results for input(s): TSH, T4TOTAL, FREET4, T3FREE, THYROIDAB in the last 72 hours. Anemia Panel: No results for input(s): VITAMINB12, FOLATE, FERRITIN, TIBC, IRON, RETICCTPCT in the last 72 hours. Urine analysis:    Component Value Date/Time   COLORURINE YELLOW 09/30/2014 1540   APPEARANCEUR CLOUDY (A) 09/30/2014 1540   LABSPEC 1.015  09/30/2014 1540   PHURINE 6.0 09/30/2014 1540   GLUCOSEU NEGATIVE 09/30/2014 1540   HGBUR NEGATIVE 09/30/2014 1540   BILIRUBINUR NEGATIVE 09/30/2014 1540   KETONESUR NEGATIVE 09/30/2014 1540   PROTEINUR NEGATIVE 09/30/2014 1540   UROBILINOGEN 0.2 09/30/2014 1540   NITRITE NEGATIVE 09/30/2014 1540   LEUKOCYTESUR NEGATIVE 09/30/2014 1540   Sepsis Labs: @LABRCNTIP (procalcitonin:4,lacticidven:4) )No results found for this or any previous visit (from the past 240 hour(s)).   Radiological Exams on Admission: Mr Jeri Cos WN Contrast  Addendum Date: 10/23/2017   ADDENDUM REPORT: 10/23/2017 12:32 ADDENDUM: Study discussed by telephone with Dr. Irene Limbo, who is on-call for provider Diana Schwartz, on 10/23/2017 at 1227 hours. Electronically Signed   By: Genevie Ann M.D.   On: 10/23/2017 12:32   Result Date: 10/23/2017 CLINICAL DATA:  52 year old female with metastatic bronchogenic carcinoma diagnosed recently on PET-CT. Abnormal CT appearance of the right hemisphere on the recent PET images.  Headaches. Staging. EXAM: MRI HEAD WITHOUT AND WITH CONTRAST TECHNIQUE: Multiplanar, multiecho pulse sequences of the brain and surrounding structures were obtained without and with intravenous contrast. CONTRAST:  52mL MULTIHANCE GADOBENATE DIMEGLUMINE 529 MG/ML IV SOLN COMPARISON:  PET-CT 10/19/2017.  Cervical spine MRI 12/09/2003. FINDINGS: Brain: Numerous enhancing brain metastases scattered in both hemispheres and the right cerebellum. Approximately 21 individual metastases are identified, ranging from punctate to 16 millimeters diameter. All lesions are annotated on series 11. The largest lesions are surrounded by vasogenic edema, and/or in the anterior superior right frontal lobe, posterior left frontal and parietal lobe, anterior inferior right frontal lobe, and several adjacent lesions in the right temporal lobe (including the largest on series 11, image 22. There is trace leftward midline shift. Basilar cisterns remain widely patent. There is mild mass effect on the lateral ventricles with no ventriculomegaly. None of the metastasis appear to be hemorrhagic. Many demonstrate restricted diffusion compatible with hyper cellularity. No associated leptomeningeal or other dural thickening. Scattered superimposed small nonenhancing nonspecific cerebral white matter T2 and FLAIR hyperintense foci. Mild patchy nonspecific T2 hyperintensity in the pons. No extra-axial collection or acute intracranial hemorrhage. Cervicomedullary junction and pituitary are within normal limits. Vascular: Major intracranial vascular flow voids are preserved. The major dural venous sinuses are enhancing and appear patent. Skull and upper cervical spine: Cervical spine degeneration but superimposed suspicious marrow signal in the anterior C4 vertebral body, which seems to correspond to abnormal PET activity on 10/19/2017 (series 3, image 12 today). Negative visible cervical spinal cord. No suspicious calvarium marrow signal.  Sinuses/Orbits: Orbit soft tissues are within normal limits sinus mucosal thickening. There is moderate widespread paranasal. Other: Visible internal auditory structures appear normal. Mastoid air cells are clear. Trace retained secretions in the nasopharynx. Scalp and face soft tissues appear negative. IMPRESSION: 1. Positive for widespread metastatic disease to the brain. Approximately 22 individual metastases are identified ranging from punctate to 16 mm diameter. 2. Multifocal associated vasogenic edema in the cerebral hemispheres, although there is only trace associated leftward midline shift and there is no ventriculomegaly or impending herniation. 3. Small C4 vertebral body metastasis suspected. No skull metastasis identified. Electronically Signed: By: Genevie Ann M.D. On: 10/23/2017 11:34      Assessment/Plan Active Problems:   Brain edema (HCC)    #1 brain metastasis with vasogenic edema: IV dexamethasone every 6 hours Monitor CBGs and start sliding scale as needed  radiation oncology evaluation on Monday  #2 unsteady gait: Likely due to diagnosis #1 PT/OT eval eval and treat  #3 hypokalemia: Due  to poor oral intake suspected-repeat and follow  #4 hypoalbuminemia: Likely due to putting color malnutrition related to malignancy Check prealbumin Nutrition consult  DVT prophylaxis: SCD' Code Status: Full Family Communication: Daughter and husband at bedside Disposition Plan: Home Consults called: Oncology, Dr. Irene Limbo Admission status: inpatient / tele)   OSEI-BONSU,Jerrika Ledlow MD [facility] Pager [phone]  If 7PM-7AM, please contact night-coverage [email or web address] Password [facility]  10/23/2017, 5:16 PM

## 2017-10-23 NOTE — ED Notes (Signed)
Bed: DE08 Expected date:  Expected time:  Means of arrival:  Comments: [place]

## 2017-10-23 NOTE — ED Triage Notes (Signed)
Patient sent by Dr. Earlie Server after MRI results from this morning shows mets to brain. Patient reports diagnosis of  "cancer everywhere" yesterday. Patient c/o headache.

## 2017-10-23 NOTE — Telephone Encounter (Signed)
I was called by Dr Nevada Crane from radiology regarding Urgent MRI brain done for Diana Schwartz today. This was noted to show extensive metastatic disease in the brain with vasogenic edema and midl midline shift but no evidence of impending herniation or hydrocephalus.  I called and discussed the MRI brain results in details with the patient . She is noting significant headaches and nausea.  I recommended going to the [facility] ED immediately for mx of extensive brain metastatic disease likely from lung primary with symptoms of increased ICT. She notes that she will pursue immediate evaluation and care at [facility] ED. -will need high dose IV Dexamethasone 4-6mg  Q6hours -urgent radiation oncology consultation for consideration of palliative RT -prn consultation of neuro Sx if any sizable , immediately concerning/symptomatic lesion. -inform Dr Earlie Server to weigh in on overall treatment plan on Monday. -call on call medical oncologist if any other new questions over the weekend.  Sullivan Lone MD Diana

## 2017-10-23 NOTE — ED Provider Notes (Addendum)
[facility] DEPT Provider Note   CSN: [id number] Arrival date & time: 10/23/17  1331     History   Chief Complaint Chief Complaint  Patient presents with  . Headache    HPI Diana Schwartz is a 52 y.o. female.   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  The patient reportedly has had a headache over the last several months.  She was just recently diagnosed with metastatic cancer and had a PET scan showing diffuse metastases.  She had an MRI as an outpatient yesterday which showed diffuse, 22 metastases to the brain with vasogenic edema.  She is had nausea, vomiting, and gait difficulty.  She was subsequently sent to the ED by oncology for IV treatment, immediate evaluation, and further management.  The patient states she has a mild, aching, generalized headache.  No neck pain or stiffness.  No visual changes.  No numbness or weakness.  No alleviating or aggravating factors.  Past Medical History:  Diagnosis Date  . Anxiety   . Autoimmune hepatitis ([place])    no current med.  . Breast cancer [facility]) 2013   Left breast cancer. Treated at [facility]. Per notes, nvasive lobular carcinoma. S/P bilateral mastectomy with recontruction and Tamoxifen X5 years.   . Carpal tunnel syndrome of left wrist   . Degenerative disc disease, lumbar   . Depression   . Fibromyalgia   . Lupus (systemic lupus erythematosus) ([place])   . Osteoarthritis   . Rash    arms, chest - due to lupus  . Raynaud disease   . Rheumatoid arthritis ([place])   . Sjoegren syndrome [facility])     Patient Active Problem List   Diagnosis Date Noted  . Lung mass 10/22/2017  . Lupus 10/21/2017  . Autoimmune hepatitis ([place]) 10/21/2017  . Breast cancer ([place]) 10/21/2017  . Depression 10/21/2017  . Anxiety 10/21/2017  . Fibromyalgia 10/21/2017    . Migraines 10/21/2017  . Lumbar spondylosis 05/30/2015    Past Surgical History:  Procedure Laterality Date  . ANTERIOR CERVICAL DECOMP/DISCECTOMY FUSION  02/06/2004   C5-6, C6-7  . AXILLARY NODE DISSECTION Left 08/2012  . BREAST RECONSTRUCTION  06/2013  . CARPAL TUNNEL RELEASE Right 1989  . CARPAL TUNNEL RELEASE Left 10/02/2013   Procedure: LEFT CARPAL TUNNEL RELEASE;  Surgeon: Linna Hoff, MD;  Location: [facility];  Service: Orthopedics;  Laterality: Left;  Local Anesthesia with IV Sedation  . FOOT SURGERY Right    bunion and broke toe and then realign toe  . MASTECTOMY Bilateral 08/12/2012   LEFT BREAST CANCER  . thoracic back surgery  05/2015  . VAGINAL HYSTERECTOMY  1999    OB History    No data available       Home Medications    Prior to Admission medications   Medication Sig Start Date End Date Taking? Authorizing Provider  acetaminophen (TYLENOL) 500 MG tablet Take 1,000 mg by mouth every 6 (six) hours as needed.   Yes [provider]  ALPRAZolam Duanne Moron) 0.5 MG tablet Take 0.5 mg by mouth at bedtime as needed for anxiety.   Yes [provider]  azaTHIOprine (IMURAN) 50 MG tablet Take 50 mg by mouth 2 (two) times daily.   Yes [provider]  Cholecalciferol (HM VITAMIN D3) 4000 units CAPS Take by mouth.   Yes  [provider]  hydroxychloroquine (PLAQUENIL) 200 MG tablet Take 200 mg by mouth 2 (two) times daily.   Yes [provider]  venlafaxine XR (EFFEXOR-XR) 150 MG 24 hr capsule Take 2 capsules by mouth daily with breakfast.  10/20/17  Yes [provider]    Family History Family History  Problem Relation Age of Onset  . Lung cancer Mother   . Hypertension Mother   . Lung cancer Father   . Rheum arthritis Father   . Hypertension Father   . Colon cancer Neg Hx     Social History Social History   Tobacco Use  . Smoking status: Former Smoker    Last attempt to quit: 05/24/2012     Years since quitting: 5.4  . Smokeless tobacco: Never Used  Substance Use Topics  . Alcohol use: Yes    Alcohol/week: 0.0 oz    Comment: socially  . Drug use: No     Allergies   Oxycodone; Doxycycline; and Prednisone   Review of Systems Review of Systems  Constitutional: Positive for fatigue. Negative for chills and fever.  HENT: Negative for congestion, rhinorrhea and sore throat.   Eyes: Negative for visual disturbance.  Respiratory: Negative for cough, shortness of breath and wheezing.   Cardiovascular: Negative for chest pain and leg swelling.  Gastrointestinal: Negative for abdominal pain, diarrhea, nausea and vomiting.  Genitourinary: Negative for dysuria, flank pain, vaginal bleeding and vaginal discharge.  Musculoskeletal: Negative for neck pain.  Skin: Negative for rash.  Allergic/Immunologic: Negative for immunocompromised state.  Neurological: Positive for dizziness and headaches. Negative for syncope.  Hematological: Does not bruise/bleed easily.  All other systems reviewed and are negative.    Physical Exam Updated Vital Signs BP 98/68 (BP Location: Left Arm)   Pulse 94   Temp 98.1 F (36.7 C) (Oral)   Resp 18   SpO2 97%   Physical Exam  Constitutional: She is oriented to person, place, and time. She appears well-developed and well-nourished. No distress.  HENT:  Head: Normocephalic and atraumatic.  Eyes: Conjunctivae are normal.  Neck: Neck supple.  Cardiovascular: Normal rate, regular rhythm and normal heart sounds. Exam reveals no friction rub.  No murmur heard. Pulmonary/Chest: Effort normal and breath sounds normal. No respiratory distress. She has no wheezes. She has no rales.  Abdominal: She exhibits no distension.  Musculoskeletal: She exhibits no edema.  Neurological: She is alert and oriented to person, place, and time. She exhibits normal muscle tone.  Skin: Skin is warm. Capillary refill takes less than 2 seconds.  Psychiatric: She has a  normal mood and affect.  Nursing note and vitals reviewed.   Neurological Exam:  Mental Status: Alert and oriented to person, place, and time. Attention and concentration normal. Speech clear. Recent memory is intact.. Cranial Nerves: Visual fields grossly intact. EOMI and PERRLA. Mild bidirectional nystagmus. Facial sensation intact at forehead, maxillary cheek, and chin/mandible bilaterally. No facial asymmetry or weakness. Hearing grossly normal. Uvula is midline, and palate elevates symmetrically. Normal SCM and trapezius strength. Tongue midline without fasciculations. Motor: Muscle strength 5/5 in proximal and distal UE and LE bilaterally. No pronator drift. Muscle tone normal. Reflexes: 2+ and symmetrical in all four extremities.  Sensation: Intact to light touch in upper and lower extremities distally bilaterally.  Gait: Normal without ataxia. Coordination: Normal FTN bilaterally.    ED Treatments / Results  Labs (all labs ordered are listed, but only abnormal results are displayed) Labs Reviewed  CBC WITH DIFFERENTIAL/PLATELET - Abnormal;  Notable for the following components:      Result Value   RBC 3.73 (*)    MCV 101.3 (*)    All other components within normal limits  BASIC METABOLIC PANEL - Abnormal; Notable for the following components:   Potassium 3.3 (*)    All other components within normal limits    EKG  EKG Interpretation None       Radiology Mr Jeri Cos Wo Contrast  Addendum Date: 10/23/2017   ADDENDUM REPORT: 10/23/2017 12:32 ADDENDUM: Study discussed by telephone with Dr. Irene Limbo, who is on-call for provider Mikey Bussing, on 10/23/2017 at 1227 hours. Electronically Signed   By: Genevie Ann M.D.   On: 10/23/2017 12:32   Result Date: 10/23/2017 CLINICAL DATA:  52 year old female with metastatic bronchogenic carcinoma diagnosed recently on PET-CT. Abnormal CT appearance of the right hemisphere on the recent PET images. Headaches. Staging. EXAM: MRI HEAD WITHOUT AND  WITH CONTRAST TECHNIQUE: Multiplanar, multiecho pulse sequences of the brain and surrounding structures were obtained without and with intravenous contrast. CONTRAST:  65mL MULTIHANCE GADOBENATE DIMEGLUMINE 529 MG/ML IV SOLN COMPARISON:  PET-CT 10/19/2017.  Cervical spine MRI 12/09/2003. FINDINGS: Brain: Numerous enhancing brain metastases scattered in both hemispheres and the right cerebellum. Approximately 21 individual metastases are identified, ranging from punctate to 16 millimeters diameter. All lesions are annotated on series 11. The largest lesions are surrounded by vasogenic edema, and/or in the anterior superior right frontal lobe, posterior left frontal and parietal lobe, anterior inferior right frontal lobe, and several adjacent lesions in the right temporal lobe (including the largest on series 11, image 22. There is trace leftward midline shift. Basilar cisterns remain widely patent. There is mild mass effect on the lateral ventricles with no ventriculomegaly. None of the metastasis appear to be hemorrhagic. Many demonstrate restricted diffusion compatible with hyper cellularity. No associated leptomeningeal or other dural thickening. Scattered superimposed small nonenhancing nonspecific cerebral white matter T2 and FLAIR hyperintense foci. Mild patchy nonspecific T2 hyperintensity in the pons. No extra-axial collection or acute intracranial hemorrhage. Cervicomedullary junction and pituitary are within normal limits. Vascular: Major intracranial vascular flow voids are preserved. The major dural venous sinuses are enhancing and appear patent. Skull and upper cervical spine: Cervical spine degeneration but superimposed suspicious marrow signal in the anterior C4 vertebral body, which seems to correspond to abnormal PET activity on 10/19/2017 (series 3, image 12 today). Negative visible cervical spinal cord. No suspicious calvarium marrow signal. Sinuses/Orbits: Orbit soft tissues are within normal  limits sinus mucosal thickening. There is moderate widespread paranasal. Other: Visible internal auditory structures appear normal. Mastoid air cells are clear. Trace retained secretions in the nasopharynx. Scalp and face soft tissues appear negative. IMPRESSION: 1. Positive for widespread metastatic disease to the brain. Approximately 22 individual metastases are identified ranging from punctate to 16 mm diameter. 2. Multifocal associated vasogenic edema in the cerebral hemispheres, although there is only trace associated leftward midline shift and there is no ventriculomegaly or impending herniation. 3. Small C4 vertebral body metastasis suspected. No skull metastasis identified. Electronically Signed: By: Genevie Ann M.D. On: 10/23/2017 11:34    Procedures Procedures (including critical care time)  Medications Ordered in ED Medications  dexamethasone (DECADRON) injection 10 mg (10 mg Intravenous Given 10/23/17 1545)  0.9 %  sodium chloride infusion ( Intravenous New Bag/Given 10/23/17 1542)  prochlorperazine (COMPAZINE) injection 10 mg (10 mg Intravenous Given 10/23/17 1543)  diphenhydrAMINE (BENADRYL) injection 25 mg (25 mg Intravenous Given 10/23/17 1546)  Initial Impression / Assessment and Plan / ED Course  I have reviewed the triage vital signs and the nursing notes.  Pertinent labs & imaging results that were available during my care of the patient were reviewed by me and considered in my medical decision making (see chart for details).     52 year old female with past medical history of here with headache, nausea, and vomiting.  Recent MRI shows diffuse bony and intracranial metastases with vasogenic edema.  Mild midline shift but no impending herniation.  Review Dr. Grier Mitts notes with oncology.  Will start IV steroids, plan for admission, consultation with radiation oncology on Monday, and further treatment.  Patient updated and in agreement.  Final Clinical Impressions(s) / ED Diagnoses    Final diagnoses:  Brain metastases [facility])     Duffy Bruce, MD 10/23/17 1524    Duffy Bruce, MD 10/23/17 1630

## 2017-10-24 DIAGNOSIS — F329 Major depressive disorder, single episode, unspecified: Secondary | ICD-10-CM

## 2017-10-24 DIAGNOSIS — C50919 Malignant neoplasm of unspecified site of unspecified female breast: Secondary | ICD-10-CM

## 2017-10-24 DIAGNOSIS — K754 Autoimmune hepatitis: Secondary | ICD-10-CM

## 2017-10-24 DIAGNOSIS — M797 Fibromyalgia: Secondary | ICD-10-CM

## 2017-10-24 DIAGNOSIS — F419 Anxiety disorder, unspecified: Secondary | ICD-10-CM

## 2017-10-24 DIAGNOSIS — M329 Systemic lupus erythematosus, unspecified: Secondary | ICD-10-CM

## 2017-10-24 LAB — CBC WITH DIFFERENTIAL/PLATELET
BASOS PCT: 0 %
Basophils Absolute: 0 10*3/uL (ref 0.0–0.1)
Eosinophils Absolute: 0 10*3/uL (ref 0.0–0.7)
Eosinophils Relative: 0 %
HEMATOCRIT: 34.5 % — AB (ref 36.0–46.0)
HEMOGLOBIN: 11.2 g/dL — AB (ref 12.0–15.0)
LYMPHS PCT: 8 %
Lymphs Abs: 0.4 10*3/uL — ABNORMAL LOW (ref 0.7–4.0)
MCH: 33.4 pg (ref 26.0–34.0)
MCHC: 32.5 g/dL (ref 30.0–36.0)
MCV: 103 fL — AB (ref 78.0–100.0)
MONOS PCT: 4 %
Monocytes Absolute: 0.2 10*3/uL (ref 0.1–1.0)
NEUTROS ABS: 4.7 10*3/uL (ref 1.7–7.7)
NEUTROS PCT: 88 %
Platelets: 380 10*3/uL (ref 150–400)
RBC: 3.35 MIL/uL — ABNORMAL LOW (ref 3.87–5.11)
RDW: 14.2 % (ref 11.5–15.5)
WBC: 5.3 10*3/uL (ref 4.0–10.5)

## 2017-10-24 LAB — COMPREHENSIVE METABOLIC PANEL
ALBUMIN: 2.6 g/dL — AB (ref 3.5–5.0)
ALK PHOS: 131 U/L — AB (ref 38–126)
ALT: 38 U/L (ref 14–54)
ANION GAP: 7 (ref 5–15)
AST: 63 U/L — ABNORMAL HIGH (ref 15–41)
BUN: 12 mg/dL (ref 6–20)
CHLORIDE: 104 mmol/L (ref 101–111)
CO2: 25 mmol/L (ref 22–32)
Calcium: 8.7 mg/dL — ABNORMAL LOW (ref 8.9–10.3)
Creatinine, Ser: 0.52 mg/dL (ref 0.44–1.00)
GFR calc non Af Amer: >60 mL/min (ref >60)
Glucose, Bld: 128 mg/dL — ABNORMAL HIGH (ref 65–99)
POTASSIUM: 4.4 mmol/L (ref 3.5–5.1)
SODIUM: 136 mmol/L (ref 135–145)
Total Bilirubin: 0.2 mg/dL — ABNORMAL LOW (ref 0.3–1.2)
Total Protein: 6.7 g/dL (ref 6.5–8.1)

## 2017-10-24 LAB — PREALBUMIN: PREALBUMIN: 11 mg/dL — AB (ref 18–38)

## 2017-10-24 LAB — HIV ANTIBODY (ROUTINE TESTING W REFLEX): HIV SCREEN 4TH GENERATION: NONREACTIVE

## 2017-10-24 MED ORDER — TRAMADOL HCL 50 MG PO TABS
50.0000 mg | ORAL_TABLET | Freq: Four times a day (QID) | ORAL | Status: DC | PRN
  Administered 2017-10-24: 50 mg via ORAL
  Filled 2017-10-24: qty 1

## 2017-10-24 MED ORDER — PANTOPRAZOLE SODIUM 40 MG PO TBEC
40.0000 mg | DELAYED_RELEASE_TABLET | Freq: Two times a day (BID) | ORAL | Status: DC
  Administered 2017-10-24 – 2017-10-28 (×7): 40 mg via ORAL
  Filled 2017-10-24 (×7): qty 1

## 2017-10-24 MED ORDER — ACETAMINOPHEN 325 MG PO TABS: 650.0000 mg | ORAL_TABLET | Freq: Four times a day (QID) | ORAL | Status: DC | PRN

## 2017-10-24 MED ORDER — DEXAMETHASONE SODIUM PHOSPHATE 4 MG/ML IJ SOLN
6.0000 mg | Freq: Four times a day (QID) | INTRAMUSCULAR | Status: DC
  Administered 2017-10-24 – 2017-10-28 (×16): 6 mg via INTRAVENOUS
  Filled 2017-10-24 (×18): qty 2

## 2017-10-24 NOTE — Progress Notes (Signed)
PROGRESS NOTE    Diana Schwartz  VOH:[id number] DOB: Jun 28, 1966 DOA: 10/23/2017 PCP: [name], MD   Brief Narrative:  Diana Schwartz is a 52 y.o. female with medical history significant for but not limited to left invasive lobular carcinoma diagnosed in 2013 status post bilateral mastectomy with reconstruction was seen by oncologist to the ED to admit her for IV steroids on account of recent MRI abnormality indicating brain lesions with echogenic edema and mild midline shift without evidence of impending herniation or hydrocephalus. She reports several weeks history of fatigue with intermittent headaches and recently noted unstable gait for which reason her oncologist ordered an MRI which came back abnormal and recommended she consider ED for admission for IV steroids and relation oncology evaluation on Monday. She does also have a Lung Mass that was supposed to be biopsied this coming week. She was admitted for Brain Metastasis with Vasogenic Edema and Mild Midline Shift and started on IV Dexamethasone. Medical Oncology and Radiation Oncology to evaluate on Monday.   Assessment & Plan:   Active Problems:   Lupus   Autoimmune hepatitis ([place])   Breast cancer ([place])   Depression   Anxiety   Fibromyalgia   Lung mass   Brain edema ([facility])  Brain Metastasis with vasogenic edema and mild Leftward Midline shift -MRI showed Positive for widespread metastatic disease to the brain. Approximately 22 individual metastases are identified ranging from punctate to 16 mm diameter. Multifocal associated vasogenic edema in the cerebral hemispheres, although there is only trace associated leftward midline shift and there is no ventriculomegaly or impending herniation. Small C4 vertebral body metastasis suspected. No skull metastasis identified. -Dr. Julien Nordmann to see tomorrow but appreciated Dr. Grier Mitts recommendations -IV Dexamethasone every 6 hours dose increased from 4 mg to 6 mg; Added PPI for  GI Prophylaxis -C/w IV NS at 75 mL/hr -Continue to Monitor CBG's -Case discussed with Dr. Isidore Moos who stated patient will be evaluated Monday by Radiation Oncology   Unsteady Gait and Generalized Weakness -Likely 2/2 to Above -PT/OT Eval and Treat  Hypokalemia -Due to poor oral intake suspected- -K+ was 3.3 and improved after supplementation to 4.4 -Continue to Monitor and Replete as Necessary -Repeat CMP in AM   Hypoalbuminemia -Likely due to putting color malnutrition related to malignancy -Check Prealbumin -Nutrition consult  Lung Mass with Metastasis  -MRI of the Brain was ordered to complete staging work up and MRI as Above -Patient to get CT-Guided Core Biopsy of Hypermetabolic Lingular Mass but will defer to Oncology whether to get it done as an inpatient.  -As Above  Hx of Breast Cancer (Invasive Lobular Carcinoma) -S/p Bilateral Mastectomy with Reconstruction and Tamoxifen x5 years  Depression and Anxiety -C/w Venlafaxine XR 300 mg po Daily  -C/w Alprazolam 0.5 mg po PRN Anxiety   Hx of Lupus, Rheumatoid Arthritis, Sjrogen Syndrome -C/w Azothioprine 50 mg po BID and Hydroxychloroquine 200 mg po BID -C/w Pain control with Tramadol 50 mg po q6hprn; Added Acetaiminophen 650 mg po q6hprn Mild/Moderate Pain   Autoimmune Hepatitis  -AST was 63 and ALT was 38 -Continue to Monitor CMP  Macrocytic Anemia -Hb/Hct now 11.2/34.5 -Check Anemia Panel -Continue to Monitor for S/Sx of Bleeding -Repeat CBC in AM   DVT prophylaxis: SCDs Code Status: FULL CODE Family Communication: Discussed with family at bedside Disposition Plan: Remain Inpatient for further evaluation and recommendations  Consultants:   Medical Oncology (Dr. Julien Nordmann to evaluate tomorrow)  Discussed with Radiation Oncology Dr. Isidore Moos who stated  [facility]ads Onc to Evaluate in AM   Procedures: None  Antimicrobials:  Anti-infectives (From admission, onward)   Start     Dose/Rate Route Frequency Ordered  Stop   10/23/17 2200  hydroxychloroquine (PLAQUENIL) tablet 200 mg     200 mg Oral 2 times daily 10/23/17 1905       Subjective: Seen and examined and still had a headache. No Nausea. Still feels unsteady and wanted to know about what was going to happen with Radiation Oncology. No other complaints or concerns at this time.   Objective: Vitals:   10/23/17 1715 10/23/17 1900 10/24/17 0617 10/24/17 1339  BP: 105/62 94/60 97/63  116/60  Pulse: 72 70 72 74  Resp: 19 18 18 18   Temp: 98.1 F (36.7 C) 98.4 F (36.9 C) 98 F (36.7 C) 98.6 F (37 C)  TempSrc: Oral Oral Oral Oral  SpO2: 95% 96% 98% 95%  Weight:  54.8 kg (120 lb 14.4 oz)    Height:  5\' 2"  (1.575 m)      Intake/Output Summary (Last 24 hours) at 10/24/2017 1919 Last data filed at 10/24/2017 1800 Gross per 24 hour  Intake 1706.25 ml  Output -  Net 1706.25 ml   Filed Weights   10/23/17 1900  Weight: 54.8 kg (120 lb 14.4 oz)   Examination: Physical Exam:  Constitutional: Thin Caucasian female in NAD and appears calm and comfortable Eyes: Lids and conjunctivae normal, sclerae anicteric  ENMT: External Ears, Nose appear normal. Grossly normal hearing. Mucous membranes are  Slightly dry.  Neck: Appears normal, supple, no cervical masses, normal ROM, no appreciable thyromegaly, no JVD Respiratory: Diminished to auscultation bilaterally, no wheezing, rales, rhonchi or crackles. Normal respiratory effort and patient is not tachypenic. No accessory muscle use.  Cardiovascular: RRR, no murmurs / rubs / gallops. S1 and S2 auscultated. No extremity edema.  Abdomen: Soft, non-tender, non-distended. No masses palpated. No appreciable hepatosplenomegaly. Bowel sounds positive x4.  GU: Deferred. Musculoskeletal: No clubbing / cyanosis of digits/nails. No joint deformity upper and lower extremities. Skin: No rashes, lesions, ulcers on a limited skin evaluation. No induration; Warm and dry.  Neurologic: CN 2-12 grossly intact with no  focal deficits. Romberg sign and cerebellar reflexes not assessed.  Psychiatric: Normal judgment and insight. Alert and oriented x 3. Normal mood and appropriate affect.   Data Reviewed: I have personally reviewed following labs and imaging studies  CBC: Recent Labs  Lab 10/22/17 0747 10/23/17 1502 10/24/17 0433  WBC 6.8 5.5 5.3  NEUTROABS 4.2 3.3 4.7  HGB  --  12.7 11.2*  HCT 39.0 37.8 34.5*  MCV 100.8 101.3* 103.0*  PLT 357 370 007   Basic Metabolic Panel: Recent Labs  Lab 10/22/17 0747 10/23/17 1502 10/24/17 0433  NA 135* 138 136  K 4.3 3.3* 4.4  CL 100 101 104  CO2 25 27 25   GLUCOSE 79 88 128*  BUN 6* 10 12  CREATININE 0.68 0.70 0.52  CALCIUM 9.5 8.9 8.7*   GFR: Estimated Creatinine Clearance: 65.1 mL/min (by C-G formula based on SCr of 0.52 mg/dL). Liver Function Tests: Recent Labs  Lab 10/22/17 0747 10/24/17 0433  AST 45* 63*  ALT 26 38  ALKPHOS 158* 131*  BILITOT 0.5 0.2*  PROT 7.7 6.7  ALBUMIN 3.0* 2.6*   No results for input(s): LIPASE, AMYLASE in the last 168 hours. No results for input(s): AMMONIA in the last 168 hours. Coagulation Profile: No results for input(s): INR, PROTIME in the last 168 hours. Cardiac Enzymes:  No results for input(s): CKTOTAL, CKMB, CKMBINDEX, TROPONINI in the last 168 hours. BNP (last 3 results) No results for input(s): PROBNP in the last 8760 hours. HbA1C: No results for input(s): HGBA1C in the last 72 hours. CBG: Recent Labs  Lab 10/19/17 1247 10/23/17 2127  GLUCAP 100* 197*   Lipid Profile: No results for input(s): CHOL, HDL, LDLCALC, TRIG, CHOLHDL, LDLDIRECT in the last 72 hours. Thyroid Function Tests: No results for input(s): TSH, T4TOTAL, FREET4, T3FREE, THYROIDAB in the last 72 hours. Anemia Panel: No results for input(s): VITAMINB12, FOLATE, FERRITIN, TIBC, IRON, RETICCTPCT in the last 72 hours. Sepsis Labs: No results for input(s): PROCALCITON, LATICACIDVEN in the last 168 hours.  No results found  for this or any previous visit (from the past 240 hour(s)).   Radiology Studies: Mr Jeri Cos CV Contrast  Addendum Date: 10/23/2017   ADDENDUM REPORT: 10/23/2017 12:32 ADDENDUM: Study discussed by telephone with Dr. Irene Limbo, who is on-call for provider Mikey Bussing, on 10/23/2017 at 1227 hours. Electronically Signed   By: Genevie Ann M.D.   On: 10/23/2017 12:32   Result Date: 10/23/2017 CLINICAL DATA:  51 year old female with metastatic bronchogenic carcinoma diagnosed recently on PET-CT. Abnormal CT appearance of the right hemisphere on the recent PET images. Headaches. Staging. EXAM: MRI HEAD WITHOUT AND WITH CONTRAST TECHNIQUE: Multiplanar, multiecho pulse sequences of the brain and surrounding structures were obtained without and with intravenous contrast. CONTRAST:  22mL MULTIHANCE GADOBENATE DIMEGLUMINE 529 MG/ML IV SOLN COMPARISON:  PET-CT 10/19/2017.  Cervical spine MRI 12/09/2003. FINDINGS: Brain: Numerous enhancing brain metastases scattered in both hemispheres and the right cerebellum. Approximately 21 individual metastases are identified, ranging from punctate to 16 millimeters diameter. All lesions are annotated on series 11. The largest lesions are surrounded by vasogenic edema, and/or in the anterior superior right frontal lobe, posterior left frontal and parietal lobe, anterior inferior right frontal lobe, and several adjacent lesions in the right temporal lobe (including the largest on series 11, image 22. There is trace leftward midline shift. Basilar cisterns remain widely patent. There is mild mass effect on the lateral ventricles with no ventriculomegaly. None of the metastasis appear to be hemorrhagic. Many demonstrate restricted diffusion compatible with hyper cellularity. No associated leptomeningeal or other dural thickening. Scattered superimposed small nonenhancing nonspecific cerebral white matter T2 and FLAIR hyperintense foci. Mild patchy nonspecific T2 hyperintensity in the pons. No  extra-axial collection or acute intracranial hemorrhage. Cervicomedullary junction and pituitary are within normal limits. Vascular: Major intracranial vascular flow voids are preserved. The major dural venous sinuses are enhancing and appear patent. Skull and upper cervical spine: Cervical spine degeneration but superimposed suspicious marrow signal in the anterior C4 vertebral body, which seems to correspond to abnormal PET activity on 10/19/2017 (series 3, image 12 today). Negative visible cervical spinal cord. No suspicious calvarium marrow signal. Sinuses/Orbits: Orbit soft tissues are within normal limits sinus mucosal thickening. There is moderate widespread paranasal. Other: Visible internal auditory structures appear normal. Mastoid air cells are clear. Trace retained secretions in the nasopharynx. Scalp and face soft tissues appear negative. IMPRESSION: 1. Positive for widespread metastatic disease to the brain. Approximately 22 individual metastases are identified ranging from punctate to 16 mm diameter. 2. Multifocal associated vasogenic edema in the cerebral hemispheres, although there is only trace associated leftward midline shift and there is no ventriculomegaly or impending herniation. 3. Small C4 vertebral body metastasis suspected. No skull metastasis identified. Electronically Signed: By: Genevie Ann M.D. On: 10/23/2017 11:34   Scheduled Meds: .  azaTHIOprine  50 mg Oral BID  . cholecalciferol  1,000 Units Oral Daily  . dexamethasone  6 mg Intravenous Q6H  . hydroxychloroquine  200 mg Oral BID  . Influenza vac split quadrivalent PF  0.5 mL Intramuscular Tomorrow-1000  . pantoprazole  40 mg Oral BID  . venlafaxine XR  300 mg Oral Q breakfast   Continuous Infusions: . sodium chloride 75 mL/hr at 10/24/17 0755    LOS: 1 day   Kerney Elbe, DO [facility] Pager [phone]  If 7PM-7AM, please contact night-coverage [email or web address] Password [facility] 10/24/2017, 7:19 PM

## 2017-10-25 ENCOUNTER — Ambulatory Visit
Admit: 2017-10-25 | Discharge: 2017-10-25 | Disposition: A | Discharge status: Home / Self Care | Payer: 59 | Attending: Radiation Oncology | Admitting: Radiation Oncology

## 2017-10-25 ENCOUNTER — Inpatient Hospital Stay (HOSPITAL_COMMUNITY): Payer: 59

## 2017-10-25 ENCOUNTER — Encounter: Payer: Self-pay | Admitting: *Deleted

## 2017-10-25 ENCOUNTER — Encounter (HOSPITAL_COMMUNITY): Payer: Self-pay | Admitting: Interventional Radiology

## 2017-10-25 DIAGNOSIS — C3490 Malignant neoplasm of unspecified part of unspecified bronchus or lung: Secondary | ICD-10-CM

## 2017-10-25 DIAGNOSIS — R918 Other nonspecific abnormal finding of lung field: Secondary | ICD-10-CM

## 2017-10-25 DIAGNOSIS — C801 Malignant (primary) neoplasm, unspecified: Secondary | ICD-10-CM

## 2017-10-25 DIAGNOSIS — L930 Discoid lupus erythematosus: Secondary | ICD-10-CM

## 2017-10-25 DIAGNOSIS — C7931 Secondary malignant neoplasm of brain: Secondary | ICD-10-CM

## 2017-10-25 DIAGNOSIS — C7972 Secondary malignant neoplasm of left adrenal gland: Secondary | ICD-10-CM

## 2017-10-25 DIAGNOSIS — K5900 Constipation, unspecified: Secondary | ICD-10-CM

## 2017-10-25 DIAGNOSIS — D72829 Elevated white blood cell count, unspecified: Secondary | ICD-10-CM

## 2017-10-25 DIAGNOSIS — C7951 Secondary malignant neoplasm of bone: Secondary | ICD-10-CM

## 2017-10-25 DIAGNOSIS — E440 Moderate protein-calorie malnutrition: Secondary | ICD-10-CM

## 2017-10-25 HISTORY — PX: IR FLUORO GUIDE PORT INSERTION RIGHT: IMG5741

## 2017-10-25 HISTORY — PX: IR US GUIDE VASC ACCESS RIGHT: IMG2390

## 2017-10-25 LAB — COMPREHENSIVE METABOLIC PANEL
ALK PHOS: 124 U/L (ref 38–126)
ALT: 37 U/L (ref 14–54)
AST: 80 U/L — ABNORMAL HIGH (ref 15–41)
Albumin: 2.8 g/dL — ABNORMAL LOW (ref 3.5–5.0)
Anion gap: 8 (ref 5–15)
BILIRUBIN TOTAL: 0.3 mg/dL (ref 0.3–1.2)
BUN: 7 mg/dL (ref 6–20)
CALCIUM: 8.8 mg/dL — AB (ref 8.9–10.3)
CHLORIDE: 109 mmol/L (ref 101–111)
CO2: 24 mmol/L (ref 22–32)
CREATININE: 0.46 mg/dL (ref 0.44–1.00)
Glucose, Bld: 122 mg/dL — ABNORMAL HIGH (ref 65–99)
Potassium: 4 mmol/L (ref 3.5–5.1)
Sodium: 141 mmol/L (ref 135–145)
TOTAL PROTEIN: 6.7 g/dL (ref 6.5–8.1)

## 2017-10-25 LAB — CBC WITH DIFFERENTIAL/PLATELET
Basophils Absolute: 0 10*3/uL (ref 0.0–0.1)
Basophils Relative: 0 %
Eosinophils Absolute: 0 10*3/uL (ref 0.0–0.7)
Eosinophils Relative: 0 %
HEMATOCRIT: 35.7 % — AB (ref 36.0–46.0)
HEMOGLOBIN: 11.8 g/dL — AB (ref 12.0–15.0)
LYMPHS ABS: 0.6 10*3/uL — AB (ref 0.7–4.0)
LYMPHS PCT: 4 %
MCH: 33.8 pg (ref 26.0–34.0)
MCHC: 33.1 g/dL (ref 30.0–36.0)
MCV: 102.3 fL — AB (ref 78.0–100.0)
Monocytes Absolute: 0.4 10*3/uL (ref 0.1–1.0)
Monocytes Relative: 4 %
NEUTROS ABS: 11.6 10*3/uL — AB (ref 1.7–7.7)
Neutrophils Relative %: 92 %
Platelets: 397 10*3/uL (ref 150–400)
RBC: 3.49 MIL/uL — ABNORMAL LOW (ref 3.87–5.11)
RDW: 14.2 % (ref 11.5–15.5)
WBC: 12.6 10*3/uL — ABNORMAL HIGH (ref 4.0–10.5)

## 2017-10-25 LAB — GLUCOSE, CAPILLARY
GLUCOSE-CAPILLARY: 115 mg/dL — AB (ref 65–99)
GLUCOSE-CAPILLARY: 137 mg/dL — AB (ref 65–99)
GLUCOSE-CAPILLARY: 132 mg/dL — AB (ref 65–99)
GLUCOSE-CAPILLARY: 104 mg/dL — AB (ref 65–99)
GLUCOSE-CAPILLARY: 130 mg/dL — AB (ref 65–99)
Glucose-Capillary: 115 mg/dL — ABNORMAL HIGH (ref 65–99)
Glucose-Capillary: 110 mg/dL — ABNORMAL HIGH (ref 65–99)
Glucose-Capillary: 119 mg/dL — ABNORMAL HIGH (ref 65–99)

## 2017-10-25 LAB — MAGNESIUM: MAGNESIUM: 2 mg/dL (ref 1.7–2.4)

## 2017-10-25 LAB — PHOSPHORUS: PHOSPHORUS: 3.5 mg/dL (ref 2.5–4.6)

## 2017-10-25 MED ORDER — BISACODYL 10 MG RE SUPP: 10.0000 mg | Freq: Every day | RECTAL | Status: DC | PRN

## 2017-10-25 MED ORDER — MORPHINE SULFATE (PF) 4 MG/ML IV SOLN
2.0000 mg | Freq: Once | INTRAVENOUS | Status: AC
  Administered 2017-10-25: 2 mg via INTRAVENOUS
  Filled 2017-10-25: qty 1

## 2017-10-25 MED ORDER — FENTANYL CITRATE (PF) 100 MCG/2ML IJ SOLN
INTRAMUSCULAR | Status: AC | PRN
  Administered 2017-10-25 (×2): 50 ug via INTRAVENOUS

## 2017-10-25 MED ORDER — MIDAZOLAM HCL 2 MG/2ML IJ SOLN
INTRAMUSCULAR | Status: AC
  Filled 2017-10-25: qty 4

## 2017-10-25 MED ORDER — MORPHINE SULFATE (PF) 4 MG/ML IV SOLN
2.0000 mg | INTRAVENOUS | Status: DC | PRN
  Administered 2017-10-25 – 2017-10-27 (×8): 2 mg via INTRAVENOUS
  Filled 2017-10-25 (×8): qty 1

## 2017-10-25 MED ORDER — PREMIER PROTEIN SHAKE
11.0000 [oz_av] | ORAL | Status: DC
  Administered 2017-10-28: 11 [oz_av] via ORAL
  Filled 2017-10-25 (×3): qty 325.31

## 2017-10-25 MED ORDER — FENTANYL CITRATE (PF) 100 MCG/2ML IJ SOLN
INTRAMUSCULAR | Status: AC
  Filled 2017-10-25: qty 4

## 2017-10-25 MED ORDER — SENNOSIDES-DOCUSATE SODIUM 8.6-50 MG PO TABS
1.0000 | ORAL_TABLET | Freq: Two times a day (BID) | ORAL | Status: DC
  Administered 2017-10-25 – 2017-10-28 (×5): 1 via ORAL
  Filled 2017-10-25 (×6): qty 1

## 2017-10-25 MED ORDER — ENSURE ENLIVE PO LIQD
237.0000 mL | ORAL | Status: DC
  Administered 2017-10-26 – 2017-10-28 (×3): 237 mL via ORAL

## 2017-10-25 MED ORDER — MIDAZOLAM HCL 2 MG/2ML IJ SOLN
INTRAMUSCULAR | Status: AC | PRN
  Administered 2017-10-25 (×2): 1 mg via INTRAVENOUS

## 2017-10-25 MED ORDER — LIDOCAINE-EPINEPHRINE (PF) 2 %-1:200000 IJ SOLN
INTRAMUSCULAR | Status: AC
  Filled 2017-10-25: qty 20

## 2017-10-25 MED ORDER — POLYETHYLENE GLYCOL 3350 17 G PO PACK
17.0000 g | PACK | Freq: Two times a day (BID) | ORAL | Status: DC
  Administered 2017-10-25 – 2017-10-26 (×4): 17 g via ORAL
  Filled 2017-10-25 (×7): qty 1

## 2017-10-25 MED ORDER — CEFAZOLIN SODIUM-DEXTROSE 2-4 GM/100ML-% IV SOLN
2.0000 g | INTRAVENOUS | Status: AC
  Administered 2017-10-25: 2 g via INTRAVENOUS
  Filled 2017-10-25: qty 100

## 2017-10-25 MED ORDER — CEFAZOLIN SODIUM-DEXTROSE 2-4 GM/100ML-% IV SOLN
INTRAVENOUS | Status: AC
  Filled 2017-10-25: qty 100

## 2017-10-25 MED ORDER — HEPARIN SOD (PORK) LOCK FLUSH 100 UNIT/ML IV SOLN
INTRAVENOUS | Status: AC
  Filled 2017-10-25: qty 5

## 2017-10-25 MED ORDER — LIDOCAINE-EPINEPHRINE (PF) 1 %-1:200000 IJ SOLN
INTRAMUSCULAR | Status: AC | PRN
  Administered 2017-10-25: 5 mL
  Administered 2017-10-25: 20 mL

## 2017-10-25 NOTE — Progress Notes (Signed)
Occupational Therapy Evaluation Patient Details Name: Diana Schwartz MRN: [id number] DOB: 11-12-1965 Today's Date: 10/25/2017    History of Present Illness 52 y.o. female with medical history significant for but not limited to left invasive lobular carcinoma diagnosed in 2013 status post bilateral mastectomy with reconstruction was seen by oncologist to the ED to admit her for IV steroids on account of recent MRI abnormality indicating brain lesions with echogenic edema and mild midline shift    Clinical Impression   Pt admitted with brain edema. Pt currently with functional limitations due to the deficits listed below (see OT Problem List).  Pt will benefit from skilled OT to increase their safety and independence with ADL and functional mobility for ADL to facilitate discharge to venue listed below.      Follow Up Recommendations  Outpatient OT(if vision / symptoms increase) - will further assess vision. Pt leaving for port placement   Equipment Recommendations  None recommended by OT           Mobility Bed Mobility Overal bed mobility: Independent                Transfers Overall transfer level: Independent                    Balance Overall balance assessment: Modified Independent                                         ADL either performed or assessed with clinical judgement   ADL Overall ADL's : Independent                                       General ADL Comments: pt overall I with simple ADL activity but does fatigue quickly and states feeling off balance as well as blurry vision at times.  Feel pt would benefit from 1-2 more OT visits for energy conservation strategies to ensure I with ADL activity      Vision Patient Visual Report: Blurring of vision Additional Comments: pt reports some blurriness as well as jumping vision at times. No nystagmus noted     Perception     Praxis      Pertinent Vitals/Pain  Pain Assessment: No/denies pain        Extremity/Trunk Assessment         Cervical / Trunk Assessment Cervical / Trunk Assessment: Normal   Communication Communication Communication: No difficulties   Cognition Arousal/Alertness: Awake/alert Behavior During Therapy: WFL for tasks assessed/performed Overall Cognitive Status: Within Functional Limits for tasks assessed                                     General Comments  pt reports feeling off balance at times although did great with balance testing. Pt reports times she has almost fallen in last 6 monthes- but has always caught herself            Home Living Family/patient expects to be discharged to:: Private residence Living Arrangements: Spouse/significant other Available Help at Discharge: Family;Available 24 hours/day Type of Home: House Home Access: Stairs to enter [employer] of Steps: 5   Home Layout: One level  Home Equipment: [place] - 2 wheels;Shower seat;Bedside commode          Prior Functioning/Environment Level of Independence: Independent                 OT Problem List: Decreased strength;Decreased activity tolerance;Impaired vision/perception      OT Treatment/Interventions: Self-care/ADL training;Patient/family education;Visual/perceptual remediation/compensation    OT Goals(Current goals can be found in the care plan section) Acute Rehab OT Goals Patient Stated Goal: gardening OT Goal Formulation: With patient Time For Goal Achievement: 11/08/17  OT Frequency: Min 2X/week              AM-PAC PT "6 Clicks" Daily Activity     Outcome Measure Help from another person eating meals?: None Help from another person taking care of personal grooming?: None Help from another person toileting, which includes using toliet, bedpan, or urinal?: None Help from another person bathing (including washing, rinsing, drying)?: None Help from another  person to put on and taking off regular upper body clothing?: None Help from another person to put on and taking off regular lower body clothing?: None 6 Click Score: 24   End of Session Nurse Communication: Mobility status  Activity Tolerance: Patient tolerated treatment well Patient left: in chair;with call bell/phone within reach;with family/visitor present  OT Visit Diagnosis: Muscle weakness (generalized) (M62.81)                Time: 0258-5277 OT Time Calculation (min): 23 min Charges:  OT General Charges $OT Visit: 1 Visit OT Evaluation $OT Eval Moderate Complexity: 1 Mod G-Codes:     Kari Baars, OT [phone]  Payton Mccallum D 10/25/2017, 5:06 PM

## 2017-10-25 NOTE — Progress Notes (Signed)
Oncology Nurse Navigator Documentation  Oncology Nurse Navigator Flowsheets 10/25/2017  Navigator Location [facility]  Navigator Encounter Type Other/I called [facility] office regarding referral on Diana Schwartz. I left vm message for Automotive engineer at [employer].  I updated her on referral and she is in-patient. I asked that she call me back with an update.   Patient Visit Type Inpatient  Treatment Phase Treatment  Barriers/Navigation Needs Coordination of Care  Interventions Coordination of Care  Coordination of Care Other  Acuity Level 1  Time Spent with Patient 15

## 2017-10-25 NOTE — Progress Notes (Signed)
Per IR nurse. Porta-cath is ready to be accessed. Site is cdi. Blood return and flushes well. Tubing changed

## 2017-10-25 NOTE — Evaluation (Signed)
Physical Therapy Evaluation Patient Details Name: Diana Schwartz MRN: [id number] DOB: September 15, 1965 Today's Date: 10/25/2017   History of Present Illness  52 y.o. female with medical history significant for but not limited to left invasive lobular carcinoma diagnosed in 2013 status post bilateral mastectomy with reconstruction was seen by oncologist to the ED to admit her for IV steroids on account of recent MRI abnormality indicating brain lesions with echogenic edema and mild midline shift   Clinical Impression  Pt ambulated 200' with RW, no loss of balance. She is modified independent with mobility, no further skilled PT indicated. Encouraged pt to ambulate in halls with supervision TID. Instructed pt in BLE strengthening exercises to be done independently. PT signing off.     Follow Up Recommendations No PT follow up    Equipment Recommendations  None recommended by PT    Recommendations for Other Services       Precautions / Restrictions Precautions Precautions: None Restrictions Weight Bearing Restrictions: No      Mobility  Bed Mobility Overal bed mobility: Independent                Transfers Overall transfer level: Independent                  Ambulation/Gait Ambulation/Gait assistance: Modified independent (Device/Increase time) Ambulation Distance (Feet): 200 Feet Assistive device: Rolling walker (2 wheeled) Gait Pattern/deviations: WFL(Within Functional Limits)   Gait velocity interpretation: at or above normal speed for age/gender General Gait Details: steady, no LOB  Stairs            Wheelchair Mobility    Modified Rankin (Stroke Patients Only)       Balance Overall balance assessment: Modified Independent                                           Pertinent Vitals/Pain Pain Assessment: No/denies pain    Home Living Family/patient expects to be discharged to:: Private residence Living Arrangements:  Spouse/significant other Available Help at Discharge: Family;Available 24 hours/day Type of Home: House Home Access: Stairs to enter   [employer] of Steps: 5 Home Layout: One level Home Equipment: Walker - 2 wheels;Shower seat;Bedside commode      Prior Function Level of Independence: Independent               Hand Dominance        Extremity/Trunk Assessment   Upper Extremity Assessment Upper Extremity Assessment: Overall WFL for tasks assessed    Lower Extremity Assessment Lower Extremity Assessment: Generalized weakness(B knee ext 4/5 (pt reports she's week from recent double pneumonia))    Cervical / Trunk Assessment Cervical / Trunk Assessment: Normal  Communication   Communication: No difficulties  Cognition Arousal/Alertness: Awake/alert Behavior During Therapy: WFL for tasks assessed/performed Overall Cognitive Status: Within Functional Limits for tasks assessed                                        General Comments      Exercises General Exercises - Lower Extremity Ankle Circles/Pumps: AROM;Both;10 reps;Supine Quad Sets: AROM;Both;5 reps;Supine Gluteal Sets: AROM;Both;5 reps;Supine   Assessment/Plan    PT Assessment Patent does not need any further PT services  PT Problem List         PT Treatment  Interventions      PT Goals (Current goals can be found in the Care Plan section)  Acute Rehab PT Goals Patient Stated Goal: gardening PT Goal Formulation: All assessment and education complete, DC therapy    Frequency     Barriers to discharge        Co-evaluation               AM-PAC PT "6 Clicks" Daily Activity  Outcome Measure Difficulty turning over in bed (including adjusting bedclothes, sheets and blankets)?: None Difficulty moving from lying on back to sitting on the side of the bed? : None Difficulty sitting down on and standing up from a chair with arms (e.g., wheelchair, bedside commode,  etc,.)?: None Help needed moving to and from a bed to chair (including a wheelchair)?: None Help needed walking in hospital room?: None Help needed climbing 3-5 steps with a railing? : None 6 Click Score: 24    End of Session Equipment Utilized During Treatment: Gait belt Activity Tolerance: Patient tolerated treatment well Patient left: in chair;with call bell/phone within reach;with family/visitor present Nurse Communication: Mobility status      Time: 1203-1222 PT Time Calculation (min) (ACUTE ONLY): 19 min   Charges:   PT Evaluation $PT Eval Low Complexity: 1 Low     PT G Codes:          Philomena Doheny 10/25/2017, 12:29 PM [phone]

## 2017-10-25 NOTE — Consult Note (Signed)
Name: Diana Schwartz MRN: [id number] DOB: Nov 01, 1965    ADMISSION DATE:  10/23/2017 CONSULTATION DATE:  10/25/17  REFERRING MD :  Dr. Alfredia Ferguson / [facility]   CHIEF COMPLAINT:  Abnormal CT Chest    HISTORY OF PRESENT ILLNESS:  52 y/o F admitted 3/2 with complaints of headache.   PMH - former smoker (quit 2013), lupus, autoimmune hepatitis, depression, anxiety, fibromyalgia, osteoarthritis, DJD, RA, Sjgren syndrome and Raynauds.   The patient was seen in Dr. Lew Dawes office on 3/2 with new MRI results with concern for metastatic brain disease.  She carries a history of invasive left lobular carcinoma (dx in 2013) s/p bilateral mastectomy with reconstruction.  She completed tamoxifen.  In September 2018 she developed cough with concern for bilateral PNA.  The patient completed multiple rounds of antibiotics without resolution of radiographic or clinical symptoms.  She had follow up XRAY's in 09/2017 which showed persistent infiltrate. A CT of the chest was completed 10/12/17 which demonstrated a left middle lobe heterogeneous, possibly necrotic, soft tissue mass measuring up to 8.4 cm which was concerning for malignancy.  In addition, she had bulky left hilar & mediastinal lymphadenopathy.  PET-CT completed 10/19/17 showed a hypermetabolic lingular mass with cervical / thoracic nodal, left adrenal and bone metastasis concerning for primary bronchogenic carcinoma.  Also noted on the PET scan was a tiny left pleural effusion, postobstructive pneumonitis and/or lymphangitic tumor spread, and hypoattenuation within the right frontal and temporal lobes which was suboptimally evaluated.  Follow up MRI 10/23/17 was positive for widespread metastatic disease to the brain, approximately 22 individual metastases are identified ranging from punctate to 16 mm diameter, multifocal associated vasogenic edema in the cerebral hemispheres, small C4 vertebral body metastasis, no skull metastasis identified.   PCCM consulted for  evaluation for possible lymph node biopsy.    PAST MEDICAL HISTORY :   has a past medical history of Anxiety, Autoimmune hepatitis ([place]), Breast cancer ([place]) (2013), Carpal tunnel syndrome of left wrist, Degenerative disc disease, lumbar, Depression, Fibromyalgia, Lupus (systemic lupus erythematosus) ([place]), Osteoarthritis, Rash, Raynaud disease, Rheumatoid arthritis ([place]), and Sjoegren syndrome ([place]).   has a past surgical history that includes Anterior cervical decomp/discectomy fusion (02/06/2004); Breast reconstruction (06/2013); Carpal tunnel release (Right, 1989); Axillary node dissection (Left, 08/2012); Vaginal hysterectomy (1999); Mastectomy (Bilateral, 08/12/2012); Carpal tunnel release (Left, 10/02/2013); Foot surgery (Right); and thoracic back surgery (05/2015).  Prior to Admission medications   Medication Sig Start Date End Date Taking? Authorizing Provider  acetaminophen (TYLENOL) 500 MG tablet Take 1,000 mg by mouth every 6 (six) hours as needed.   Yes [provider]  ALPRAZolam Duanne Moron) 0.5 MG tablet Take 0.5 mg by mouth at bedtime as needed for anxiety.   Yes [provider]  azaTHIOprine (IMURAN) 50 MG tablet Take 50 mg by mouth 2 (two) times daily.   Yes [provider]  Cholecalciferol (HM VITAMIN D3) 4000 units CAPS Take by mouth.   Yes [provider]  hydroxychloroquine (PLAQUENIL) 200 MG tablet Take 200 mg by mouth 2 (two) times daily.   Yes [provider]  venlafaxine XR (EFFEXOR-XR) 150 MG 24 hr capsule Take 2 capsules by mouth daily with breakfast.  10/20/17  Yes [provider]    Allergies  Allergen Reactions  . Oxycodone Anaphylaxis, Hives and Swelling    Whelps and swelling in throat  . Doxycycline Nausea And Vomiting  . Prednisone Nausea And Vomiting and Diarrhea    Can take IV steroids    FAMILY  HISTORY:  family history includes Hypertension in her father and mother; Lung cancer in her father and mother;  Rheum arthritis in her father.  SOCIAL HISTORY:  reports that she quit smoking about 5 years ago. she has never used smokeless tobacco. She reports that she drinks alcohol. She reports that she does not use drugs.  REVIEW OF SYSTEMS:  POSITIVES IN BOLD Constitutional: Negative for fever, chills, weight loss, malaise/fatigue and diaphoresis.  HENT: Negative for hearing loss, ear pain, nosebleeds, congestion, sore throat, neck pain, tinnitus and ear discharge.   Eyes: Negative for blurred vision, double vision, photophobia, pain, discharge and redness.  Respiratory: Negative for cough, hemoptysis, sputum production, shortness of breath, wheezing and stridor.   Cardiovascular: Negative for chest pain, palpitations, orthopnea, claudication, leg swelling and PND.  Gastrointestinal: Negative for heartburn, nausea, vomiting, abdominal pain, diarrhea, constipation, blood in stool and melena.  Genitourinary: Negative for dysuria, urgency, frequency, hematuria and flank pain.  Musculoskeletal: Negative for myalgias, back pain, joint pain and falls.  Skin: Negative for itching and rash.  Neurological: Negative for dizziness, tingling, tremors, sensory change, speech change, focal weakness, seizures, loss of consciousness, weakness and headaches.  Endo/Heme/Allergies: Negative for environmental allergies and polydipsia. Does not bruise/bleed easily.   SUBJECTIVE:   VITAL SIGNS: Temp:  [97.8 F (36.6 C)-98.6 F (37 C)] 98.6 F (37 C) (03/04 1351) Pulse Rate:  [68-73] 73 (03/04 0536) Resp:  [16-20] 18 (03/04 1351) BP: (110-133)/(73-79) 129/78 (03/04 1351) SpO2:  [95 %-100 %] 96 % (03/04 1351)  PHYSICAL EXAMINATION: General: adult female in NAD, sitting up in bed.  Husband at bedside HEENT: MM pink/moist, no jvd PSY: calm/appropriate Neuro: AAOx4, speech clear, MAE CV: s1s2 rrr, no m/r/g PULM: even/non-labored, lungs bilaterally with faint basilar crackles  GI: soft, non-tender, bsx4 active    Extremities: warm/dry, no edema  Skin: no rashes or lesions  Recent Labs  Lab 10/23/17 1502 10/24/17 0433 10/25/17 0422  NA 138 136 141  K 3.3* 4.4 4.0  CL 101 104 109  CO2 27 25 24   BUN 10 12 7   CREATININE 0.70 0.52 0.46  GLUCOSE 88 128* 122*    Recent Labs  Lab 10/23/17 1502 10/24/17 0433 10/25/17 0422  HGB 12.7 11.2* 11.8*  HCT 37.8 34.5* 35.7*  WBC 5.5 5.3 12.6*  PLT 370 380 397    No results found.   SIGNIFICANT EVENTS  3/02  Admit with headache   STUDIES CT Chest 10/12/17 >> demonstrated a left middle lobe heterogeneous, possibly necrotic, soft tissue mass measuring up to 8.4 cm which was concerning for malignancy.  In addition, she had bulky left hilar & mediastinal lymphadenopathy.   PET-CT 3/74/82 >> hypermetabolic lingular mass with cervical / thoracic nodal, left adrenal and bone metastasis concerning for primary bronchogenic carcinoma.  Also noted on the PET scan was a tiny left pleural effusion, postobstructive pneumonitis and/or lymphangitic tumor spread, and hypoattenuation within the right frontal and temporal lobes which was suboptimally evaluated.   MRI 10/23/17 >> positive for widespread metastatic disease to the brain, approximately 22 individual metastases are identified ranging from punctate to 16 mm diameter, multifocal associated vasogenic edema in the cerebral hemispheres, small C4 vertebral body metastasis, no skull metastasis identified  CULTURES   ANTIBIOTICS    ASSESSMENT / PLAN:  Discussion: 52 y/o F, former smoker with hx of breast cancer admitted with headache.  Recent treatment in September 2018 for bilateral PNA.  Multiple rounds of antibiotics and symptoms/radiographic changes did not resolve.  Ultimately led to PET-CT which was concerning for a lingular mass with metastatic disease.  Follow up MRI of the brain reveled widespread metastatic disease.    Left Lingular Mass with evidence of Metastatic Disease  Former Smoker  Hx of  Breast Cancer   Plan: Have arranged for EBUS on 3/6 at 1000 AM for tissue sampling  NPO after MN on 3/6  Reviewed risks/benefits with patient. MD will review as well.   Oncology following, appreciate input   Noe Gens, NP-C [facility] Pgr: [phone] or if no answer [phone] 10/25/2017, 2:14 PM

## 2017-10-25 NOTE — Progress Notes (Signed)
PROGRESS NOTE    Diana Schwartz  DSK:[id number] DOB: [birth year] DOA: 10/23/2017 PCP: [name], MD   Brief Narrative:  Diana Schwartz is a 52 y.o. female with medical history significant for but not limited to left invasive lobular carcinoma diagnosed in 2013 status post bilateral mastectomy with reconstruction was seen by oncologist to the ED to admit her for IV steroids on account of recent MRI abnormality indicating brain lesions with echogenic edema and mild midline shift without evidence of impending herniation or hydrocephalus. She reports several weeks history of fatigue with intermittent headaches and recently noted unstable gait for which reason her oncologist ordered an MRI which came back abnormal and recommended she consider ED for admission for IV steroids and relation oncology evaluation on Monday. She does also have a Lung Mass that was supposed to be biopsied this coming week. She was admitted for Brain Metastasis with Vasogenic Edema and Mild Midline Shift and started on IV Dexamethasone. Medical Oncology and Radiation Oncology evaluated. Medical Oncology recommending Tissue Biopsy and Port Placement so Pulmonary and IR consulted.   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 Approximately 22 individual metastases are identified ranging from punctate to 16 mm diameter. Multifocal associated vasogenic edema in the cerebral hemispheres, although there is only trace associated leftward midline shift and there is no ventriculomegaly or  impending herniation. Small C4 vertebral body metastasis suspected. No skull metastasis identified. -IV Dexamethasone every 6 hours dose increased from 4 mg to 6 mg; Added PPI for GI Prophylaxis -C/w IV NS at 75 mL/hr -Continue to Monitor CBG's -Oncology evaluated today and recommending Port-A-Cath Placement and Lung Bx while inpatient -IR consulted for Port-A-Cath -Radiation Oncology consulted and awaiting further recommendations -Oncology consulted [facility] who reviewed case and feel as if if Pulmonary can Biopsy Lung Mass; Dr. Julien Nordmann consulted Dr. Vaughan Browner from [facility] and PCCM planning on doing EBUS 10/27/17 -Symptoms of Headache have improved; Continue to Monitor -Continue to Follow Oncology Recc's  Unsteady Gait and Generalized Weakness -Likely 2/2 to Above -PT/OT and recommending no follow up   Hypokalemia, improved -Due to poor oral intake suspected -K+ was 3.3 and improved after supplementation to 4.0 -Continue to Monitor and Replete as Necessary -Repeat CMP in AM   Hypoalbuminemia -Likely due to malignancy -Checked Prealbumin and was 11.0 -Nutrition consult as below  Lung Mass with Metastasis  -Unknown what type of Cancer -MRI of the Brain was ordered to complete staging work up and MRI as Above -Patient was to get CT-Guided Core Biopsy of Hypermetabolic Lingular Mass as an outpatient but Oncology recommends Inpatient Biopsy and reviewed Case with CT Surgery who recommended that Pulmonary can do it -Pulmonary evaluated and arranging EBUS on 3/6 for tissue Sampling  -As Above  Hx of Breast Cancer (Invasive Lobular Carcinoma) -S/p Bilateral Mastectomy with Reconstruction and Tamoxifen x5 years -Oncology following and appreciate further evaluation and recc's   Depression and Anxiety -C/w Venlafaxine XR 300 mg po Daily  -C/w Alprazolam 0.5 mg po PRN Anxiety   Hx  of Lupus, Rheumatoid Arthritis, Sjrogen Syndrome -C/w Azothioprine 50 mg po BID and Hydroxychloroquine 200 mg po  BID -C/w Pain control with Tramadol 50 mg po q6hprn; Added Acetaiminophen 650 mg po q6hprn Mild/Moderate Pain   Autoimmune Hepatitis  -AST going up and went from 45 -> 63 -> 80 -Continue to Monitor CMP  Macrocytic Anemia -Hb/Hct now 11.8/35.7 -Check Anemia Panel in AM -Continue to Monitor for S/Sx of Bleeding -Repeat CBC in AM   Moderate Malnutrition in the Context of Chronic illness -Nutritionist consulted for further evaluation and recommendations -C/w Premier Protein po Daily -C/w Ensure Enlive po Daily  Leukocytosis -Patient's WBC went from 5.3 -> 12.6  -In the setting of IV Steroid Demargination -Continue to Monitor for S/Sx of Infection -Repeat CBC in AM   Constipation -Added Miralax 17 grams po BID, Senna-Docusate 1 tab po BID, and Bisacodyl 10 mg RC Daily PRN  DVT prophylaxis: SCDs Code Status: FULL CODE Family Communication: Discussed with family at bedside Disposition Plan: Remain Inpatient for further evaluation and recommendations  Consultants:   Medical Oncology Dr. Julien Nordmann  Radiation Oncology  IR for [facility]  Pulmonary for Lung Biopsy   Procedures: None  Antimicrobials:  Anti-infectives (From admission, onward)   Start     Dose/Rate Route Frequency Ordered Stop   10/25/17 1430  ceFAZolin (ANCEF) IVPB 2g/100 mL premix     2 g 200 mL/hr over 30 Minutes Intravenous On call 10/25/17 1421 10/26/17 1430   10/23/17 2200  hydroxychloroquine (PLAQUENIL) tablet 200 mg     200 mg Oral 2 times daily 10/23/17 1905       Subjective: Seen and examined and headache improved. No Nausea and vomiting. Still felt slightly unsteady on her feet. No other concerns or complaints.    Objective: Vitals:   10/24/17 1339 10/24/17 2144 10/25/17 0536 10/25/17 1351  BP: 116/60 110/79 133/73 129/78  Pulse: 74 68 73   Resp: 18 16 20 18   Temp: 98.6 F (37 C) 98.2 F (36.8 C) 97.8 F (36.6 C) 98.6 F (37 C)  TempSrc: Oral Oral Oral Oral  SpO2: 95% 95% 100% 96%    Weight:      Height:        Intake/Output Summary (Last 24 hours) at 10/25/2017 1507 Last data filed at 10/25/2017 0700 Gross per 24 hour  Intake 2310 ml  Output -  Net 2310 ml   Filed Weights   10/23/17 1900  Weight: 54.8 kg (120 lb 14.4 oz)   Examination: Physical Exam:  Constitutional: Thin Caucasian female in NAD appears calm and comfortable Eyes: Lids and conjunctivae normal; Sclerae anicteric.  ENMT: External Ears and nose appear normal Neck: Supple with no JVD Respiratory: Diminished to auscultation bilaterally; Unlabored breathing but no appreciable wheezing/rales/rhonchi. Patient was not tachypenic or using any accessory muscles to breathe Cardiovascular: RRR; No appreciable m/r/g.  Abdomen: Soft, Mild discomfort on palpation, ND. Bowel sounds present GU: Deferred Musculoskeletal: No contractures; No cyanosis Skin: Warm and Dry; No appreciable rashes or lesions on a limited skin eval Neurologic: CN 2-12 grossly intact. No appreciable focal deficits Psychiatric: Normal mood and affect. Intact judgement and insight  Data Reviewed: I have personally reviewed following labs and imaging studies  CBC: Recent Labs  Lab 10/22/17 0747 10/23/17 1502 10/24/17 0433 10/25/17 0422  WBC 6.8 5.5 5.3 12.6*  NEUTROABS 4.2 3.3 4.7 11.6*  HGB  --  12.7 11.2* 11.8*  HCT 39.0 37.8 34.5* 35.7*  MCV 100.8 101.3* 103.0* 102.3*  PLT  357 370 380 416   Basic Metabolic Panel: Recent Labs  Lab 10/22/17 0747 10/23/17 1502 10/24/17 0433 10/25/17 0422  NA 135* 138 136 141  K 4.3 3.3* 4.4 4.0  CL 100 101 104 109  CO2 25 27 25 24   GLUCOSE 79 88 128* 122*  BUN 6* 10 12 7   CREATININE 0.68 0.70 0.52 0.46  CALCIUM 9.5 8.9 8.7* 8.8*  MG  --   --   --  2.0  PHOS  --   --   --  3.5   GFR: Estimated Creatinine Clearance: 65.1 mL/min (by C-G formula based on SCr of 0.46 mg/dL). Liver Function Tests: Recent Labs  Lab 10/22/17 0747 10/24/17 0433 10/25/17 0422  AST 45* 63* 80*  ALT  26 38 37  ALKPHOS 158* 131* 124  BILITOT 0.5 0.2* 0.3  PROT 7.7 6.7 6.7  ALBUMIN 3.0* 2.6* 2.8*   No results for input(s): LIPASE, AMYLASE in the last 168 hours. No results for input(s): AMMONIA in the last 168 hours. Coagulation Profile: No results for input(s): INR, PROTIME in the last 168 hours. Cardiac Enzymes: No results for input(s): CKTOTAL, CKMB, CKMBINDEX, TROPONINI in the last 168 hours. BNP (last 3 results) No results for input(s): PROBNP in the last 8760 hours. HbA1C: No results for input(s): HGBA1C in the last 72 hours. CBG: Recent Labs  Lab 10/24/17 1140 10/24/17 1629 10/24/17 2142 10/25/17 0740 10/25/17 1205  GLUCAP 115* 110* 137* 119* 132*   Lipid Profile: No results for input(s): CHOL, HDL, LDLCALC, TRIG, CHOLHDL, LDLDIRECT in the last 72 hours. Thyroid Function Tests: No results for input(s): TSH, T4TOTAL, FREET4, T3FREE, THYROIDAB in the last 72 hours. Anemia Panel: No results for input(s): VITAMINB12, FOLATE, FERRITIN, TIBC, IRON, RETICCTPCT in the last 72 hours. Sepsis Labs: No results for input(s): PROCALCITON, LATICACIDVEN in the last 168 hours.  No results found for this or any previous visit (from the past 240 hour(s)).   Radiology Studies: No results found. Scheduled Meds: . azaTHIOprine  50 mg Oral BID  . cholecalciferol  1,000 Units Oral Daily  . dexamethasone  6 mg Intravenous Q6H  . feeding supplement (ENSURE ENLIVE)  237 mL Oral Q24H  . hydroxychloroquine  200 mg Oral BID  . Influenza vac split quadrivalent PF  0.5 mL Intramuscular Tomorrow-1000  . pantoprazole  40 mg Oral BID  . polyethylene glycol  17 g Oral BID  . protein supplement shake  11 oz Oral Q24H  . senna-docusate  1 tablet Oral BID  . venlafaxine XR  300 mg Oral Q breakfast   Continuous Infusions: . sodium chloride 75 mL/hr at 10/24/17 2159  .  ceFAZolin (ANCEF) IV      LOS: 2 days   Kerney Elbe, DO [facility] Pager [phone]  If 7PM-7AM,  please contact night-coverage [email or web address] Password [email or web address] 10/25/2017, 3:07 PM

## 2017-10-25 NOTE — Procedures (Signed)
Pre Procedure Dx: Metastatic Breast Cancer Post Procedural Dx: Same  Successful placement of right IJ approach port-a-cath with tip at the superior caval atrial junction. The catheter is ready for immediate use.  Estimated Blood Loss: Minimal  Complications: None immediate.  Ronny Bacon, MD Pager #: [phone]

## 2017-10-25 NOTE — Consult Note (Addendum)
Chief Complaint: Patient was seen in consultation today for poor venous access  Referring Physician(s): Dr. Earlie Server  Supervising Physician: Sandi Mariscal  Patient Status: [facility] - In-pt  History of Present Illness: Diana Schwartz is a 52 y.o. female with past medical history of lupus, autoimmune hepatitis, RA, Sjoegrens syndrome, and left invasive lobular carcinoma diagnosed in 2013 with reconstructive surgery presented to ED at the request of her oncologist due to recent MRI showing brain lesions with echogenic edema and mild midline shift without evidence of impending herniation or hydrocephalus.  She has been admitted for further work-up and treatment.  She has poor venous access and difficulty maintaining IV lines.  IR consulted for Port-A-Cath placement at the request of Dr. Earlie Server.  She has been NPO as of 8AM.  She is not currently taking blood thinners.    Past Medical History:  Diagnosis Date  . Anxiety   . Autoimmune hepatitis ([place])    no current med.  . Breast cancer [facility]) 2013   Left breast cancer. Treated at [facility]. Per notes, nvasive lobular carcinoma. S/P bilateral mastectomy with recontruction and Tamoxifen X5 years.   . Carpal tunnel syndrome of left wrist   . Degenerative disc disease, lumbar   . Depression   . Fibromyalgia   . Lupus (systemic lupus erythematosus) ([place])   . Osteoarthritis   . Rash    arms, chest - due to lupus  . Raynaud disease   . Rheumatoid arthritis ([place])   . Sjoegren syndrome [facility])     Past Surgical History:  Procedure Laterality Date  . ANTERIOR CERVICAL DECOMP/DISCECTOMY FUSION  02/06/2004   C5-6, C6-7  . AXILLARY NODE DISSECTION Left 08/2012  . BREAST RECONSTRUCTION  06/2013  . CARPAL TUNNEL RELEASE Right 1989  . CARPAL TUNNEL RELEASE Left 10/02/2013   Procedure: LEFT CARPAL TUNNEL RELEASE;  Surgeon: Linna Hoff, MD;  Location: [facility];  Service: Orthopedics;  Laterality: Left;  Local Anesthesia with IV  Sedation  . FOOT SURGERY Right    bunion and broke toe and then realign toe  . MASTECTOMY Bilateral 08/12/2012   LEFT BREAST CANCER  . thoracic back surgery  05/2015  . VAGINAL HYSTERECTOMY  1999    Allergies: Oxycodone; Doxycycline; and Prednisone  Medications: Prior to Admission medications   Medication Sig Start Date End Date Taking? Authorizing Provider  acetaminophen (TYLENOL) 500 MG tablet Take 1,000 mg by mouth every 6 (six) hours as needed.   Yes [provider]  ALPRAZolam Duanne Moron) 0.5 MG tablet Take 0.5 mg by mouth at bedtime as needed for anxiety.   Yes [provider]  azaTHIOprine (IMURAN) 50 MG tablet Take 50 mg by mouth 2 (two) times daily.   Yes [provider]  Cholecalciferol (HM VITAMIN D3) 4000 units CAPS Take by mouth.   Yes [provider]  hydroxychloroquine (PLAQUENIL) 200 MG tablet Take 200 mg by mouth 2 (two) times daily.   Yes [provider]  venlafaxine XR (EFFEXOR-XR) 150 MG 24 hr capsule Take 2 capsules by mouth daily with breakfast.  10/20/17  Yes [provider]     Family History  Problem Relation Age of Onset  . Lung cancer Mother   . Hypertension Mother   . Lung cancer Father   . Rheum arthritis Father   . Hypertension Father   . Colon cancer Neg Hx     Social History   Socioeconomic History  . Marital status: Married    Spouse  name: Ronalee Belts  . Number of children: 1  . Years of education: None  . Highest education level: None  Social Needs  . Financial resource strain: None  . Food insecurity - worry: None  . Food insecurity - inability: None  . Transportation needs - medical: None  . Transportation needs - non-medical: None  Occupational History  . Occupation: disability  Tobacco Use  . Smoking status: Former Smoker    Last attempt to quit: 05/24/2012    Years since quitting: 5.4  . Smokeless tobacco: Never Used  Substance and Sexual Activity  . Alcohol use: Yes     Alcohol/week: 0.0 oz    Comment: socially  . Drug use: No  . Sexual activity: None  Other Topics Concern  . None  Social History Narrative  . None     Review of Systems: A 12 point ROS discussed and pertinent positives are indicated in the HPI above.  All other systems are negative.  Review of Systems  Constitutional: Negative for fatigue and fever.  Respiratory: Negative for cough and shortness of breath.   Cardiovascular: Negative for chest pain.  Gastrointestinal: Negative for abdominal pain.  Neurological: Positive for headaches.  Psychiatric/Behavioral: Negative for behavioral problems and confusion.    Vital Signs: BP 129/78 (BP Location: Left Arm)   Pulse 73   Temp 98.6 F (37 C) (Oral)   Resp 18   Ht 5\' 2"  (1.575 m)   Wt 120 lb 14.4 oz (54.8 kg)   SpO2 96%   BMI 22.11 kg/m   Physical Exam  Constitutional: She is oriented to person, place, and time. She appears well-developed.  Cardiovascular: Normal rate, regular rhythm and normal heart sounds.  Pulmonary/Chest: Effort normal and breath sounds normal. No respiratory distress.  Abdominal: Soft.  Neurological: She is alert and oriented to person, place, and time.  Skin: Skin is warm and dry.  Psychiatric: She has a normal mood and affect. Her behavior is normal. Judgment and thought content normal.  Nursing note and vitals reviewed.    MD Evaluation Airway: WNL Heart: WNL Abdomen: WNL Chest/ Lungs: WNL ASA  Classification: 3 Mallampati/Airway Score: One   Imaging: Ct Chest Wo Contrast  Addendum Date: 10/12/2017   ADDENDUM REPORT: 10/12/2017 21:51 ADDENDUM: These results will be called to the ordering clinician or representative by the Radiologist Assistant, and communication documented in the PACS or zVision Dashboard. Electronically Signed   By: Fidela Salisbury M.D.   On: 10/12/2017 21:51   Result Date: 10/12/2017 CLINICAL DATA:  Evaluate pneumonia post multiple course of antibiotics. EXAM: CT  CHEST WITHOUT CONTRAST TECHNIQUE: Multidetector CT imaging of the chest was performed following the standard protocol without IV contrast. COMPARISON:  Chest x-ray 10/06/2017 history of left breast cancer. FINDINGS: Cardiovascular: No significant vascular findings. Normal heart size. No pericardial effusion. Mediastinum/Nodes: Mediastinal lymphadenopathy with lymph nodes measuring up to 19 mm in short axis. Bulky left hilar lymphadenopathy. Normal trachea and esophagus. 1.5 cm hypoattenuated mass within the right lobe of the thyroid gland. Lungs/Pleura: Mild upper lobe predominant emphysematous changes. Bilobed low-attenuation soft tissue mass in the lingula measures 8.4 by 5.6 by 5.5 cm and abuts the pericardium and anterior mediastinum medially and the pleural surface laterally. There is probable extension along the pleural surface. Surrounding interstitial thickening suspicious for lymphangitic spread of tumor. Small left pleural effusion. Several less than 5 mm pulmonary nodules in the right upper lobe. Upper Abdomen: No acute abnormality. Musculoskeletal: Lower thoracic and lower cervical fusion with  intact hardware. No evidence of suspicious lesions. Post bilateral mastectomy and implant reconstruction. Post left axillary lymph node dissection. IMPRESSION: Left middle lobe heterogeneous possibly necrotic soft tissue mass, measuring up to 8.4 cm, highly suspicious for malignancy. The mass abuts the mediastinal border/pericardium medially and the pleural surface laterally. Suspected pleural extension of tumor, as well as lymphangitic spread of malignancy in the rest of the lung parenchyma of the lingula and portions of the left upper lobe. Tissue diagnosis and PET-CT may be considered. Bulky left hilar lymphadenopathy and mediastinal lymphadenopathy. Few less than 5 mm solid and ground-glass nodules in the right upper lobe with indeterminate significance. 1.5 cm right thyroid mass. Post bilateral mastectomy with  implant reconstruction. Post left axillary lymph node dissection. Determined left axillary lymph nodes, which could be further evaluated with focused axillary ultrasound. Emphysema (ICD10-J43.9). Electronically Signed: By: Fidela Salisbury M.D. On: 10/12/2017 20:20   Mr Jeri Cos MV Contrast  Addendum Date: 10/23/2017   ADDENDUM REPORT: 10/23/2017 12:32 ADDENDUM: Study discussed by telephone with Dr. Irene Limbo, who is on-call for provider Mikey Bussing, on 10/23/2017 at 1227 hours. Electronically Signed   By: Genevie Ann M.D.   On: 10/23/2017 12:32   Result Date: 10/23/2017 CLINICAL DATA:  52 year old female with metastatic bronchogenic carcinoma diagnosed recently on PET-CT. Abnormal CT appearance of the right hemisphere on the recent PET images. Headaches. Staging. EXAM: MRI HEAD WITHOUT AND WITH CONTRAST TECHNIQUE: Multiplanar, multiecho pulse sequences of the brain and surrounding structures were obtained without and with intravenous contrast. CONTRAST:  32mL MULTIHANCE GADOBENATE DIMEGLUMINE 529 MG/ML IV SOLN COMPARISON:  PET-CT 10/19/2017.  Cervical spine MRI 12/09/2003. FINDINGS: Brain: Numerous enhancing brain metastases scattered in both hemispheres and the right cerebellum. Approximately 21 individual metastases are identified, ranging from punctate to 16 millimeters diameter. All lesions are annotated on series 11. The largest lesions are surrounded by vasogenic edema, and/or in the anterior superior right frontal lobe, posterior left frontal and parietal lobe, anterior inferior right frontal lobe, and several adjacent lesions in the right temporal lobe (including the largest on series 11, image 22. There is trace leftward midline shift. Basilar cisterns remain widely patent. There is mild mass effect on the lateral ventricles with no ventriculomegaly. None of the metastasis appear to be hemorrhagic. Many demonstrate restricted diffusion compatible with hyper cellularity. No associated leptomeningeal or other  dural thickening. Scattered superimposed small nonenhancing nonspecific cerebral white matter T2 and FLAIR hyperintense foci. Mild patchy nonspecific T2 hyperintensity in the pons. No extra-axial collection or acute intracranial hemorrhage. Cervicomedullary junction and pituitary are within normal limits. Vascular: Major intracranial vascular flow voids are preserved. The major dural venous sinuses are enhancing and appear patent. Skull and upper cervical spine: Cervical spine degeneration but superimposed suspicious marrow signal in the anterior C4 vertebral body, which seems to correspond to abnormal PET activity on 10/19/2017 (series 3, image 12 today). Negative visible cervical spinal cord. No suspicious calvarium marrow signal. Sinuses/Orbits: Orbit soft tissues are within normal limits sinus mucosal thickening. There is moderate widespread paranasal. Other: Visible internal auditory structures appear normal. Mastoid air cells are clear. Trace retained secretions in the nasopharynx. Scalp and face soft tissues appear negative. IMPRESSION: 1. Positive for widespread metastatic disease to the brain. Approximately 22 individual metastases are identified ranging from punctate to 16 mm diameter. 2. Multifocal associated vasogenic edema in the cerebral hemispheres, although there is only trace associated leftward midline shift and there is no ventriculomegaly or impending herniation. 3. Small C4 vertebral body metastasis  suspected. No skull metastasis identified. Electronically Signed: By: Genevie Ann M.D. On: 10/23/2017 11:34   Nm Pet Image Initial (pi) Skull Base To Thigh  Result Date: 10/20/2017 CLINICAL DATA:  Initial treatment strategy for history of breast cancer. Left upper lobe/lingular mass. EXAM: NUCLEAR MEDICINE PET SKULL BASE TO THIGH TECHNIQUE: 5.9 mCi F-18 FDG was injected intravenously. Full-ring PET imaging was performed from the skull base to thigh after the radiotracer. CT data was obtained and used  for attenuation correction and anatomic localization. FASTING BLOOD GLUCOSE:  Value: 100 mg/dl COMPARISON:  Chest CT of 10/12/2017.  Abdominal CT of 08/13/2017. FINDINGS: Mediastinal blood pool activity: SUV max equal 2.2 NECK: Low left jugular/supraclavicular tiny node corresponds to hypermetabolism. This measures 4 mm and a S.U.V. max of 3.4 on image 39/4. Incidental CT findings: No cervical adenopathy. Right frontal and temporal lobe hypoattenuation, including on image 1/4. Suboptimally evaluated. Nonspecific right thyroid hypoattenuating nodule of 10 mm. CHEST: Centrally necrotic lingular primary measures on the order of 5.8 x 5.7 cm and a S.U.V. max of 13.9 on image 35/8. Surrounding ground-glass and airspace disease is favored to represent postobstructive pneumonitis versus lymphangitic tumor spread. Tiny left pleural effusion demonstrates nonspecific low-level hypermetabolism. Bilateral mediastinal nodal metastasis, including a right paratracheal node which measures 1.9 cm and a S.U.V. max of 18.1 on image 62/4. Incidental CT findings: Bilateral breast implants. Left axillary node dissection. Nonspecific bilateral pulmonary nodules, below PET resolution. ABDOMEN/PELVIS: Hypermetabolic left adrenal nodule measures 2.4 cm and a S.U.V. max of 11.5 on image 107/4. No other abnormal soft tissue hypermetabolism identified. Incidental CT findings: Abdominal aortic atherosclerosis. Hysterectomy. SKELETON: Left humeral head lesion measures 8 mm and a S.U.V. max of 7.1 on image 38/4. A left acetabular lesion is CT occult and measures a S.U.V. max of 5.9. Incidental CT findings: Lumbar spine fixation. Cervical spine fixation. IMPRESSION: 1. Hypermetabolic lingular mass with cervical/thoracic nodal, left adrenal, and bone metastasis. Most consistent with primary bronchogenic carcinoma. 2. Tiny left pleural effusion with nonspecific low-level hypermetabolism. 3. Postobstructive pneumonitis and/or lymphangitic tumor spread  more peripheral within the lingula. 4. Hypoattenuation within the right frontal and temporal lobes is suboptimally evaluated. 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 If not previously evaluated, consider further evaluation with pre and post-contrast brain MR. 5.  Aortic Atherosclerosis (ICD10-I70.0). Electronically Signed   By: Abigail Miyamoto M.D.   On: 10/20/2017 08:43    Labs:  CBC: Recent Labs    08/04/17 1613 10/22/17 0747 10/23/17 1502 10/24/17 0433 10/25/17 0422  WBC 6.6 6.8 5.5 5.3 12.6*  HGB 14.5  --  12.7 11.2* 11.8*  HCT 42.9 39.0 37.8 34.5* 35.7*  PLT 339.0 357 370 380 397    COAGS: No results for input(s): INR, APTT in the last 8760 hours.  BMP: Recent Labs    10/22/17 0747 10/23/17 1502 10/24/17 0433 10/25/17 0422  NA 135* 138 136 141  K 4.3 3.3* 4.4 4.0  CL 100 101 104 109  CO2 25 27 25 24   GLUCOSE 79 88 128* 122*  BUN 6* 10 12 7   CALCIUM 9.5 8.9 8.7* 8.8*  CREATININE 0.68 0.70 0.52 0.46  GFRNONAA >60 >60 >60 >60  GFRAA >60 >60 >60 >60    LIVER FUNCTION TESTS: Recent Labs    10/22/17 0747 10/24/17 0433 10/25/17 0422  BILITOT 0.5 0.2* 0.3  AST 45* 63* 80*  ALT 26 38 37  ALKPHOS 158* 131* 124  PROT 7.7 6.7 6.7  ALBUMIN 3.0* 2.6* 2.8*  TUMOR MARKERS: No results for input(s): AFPTM, CEA, CA199, CHROMGRNA in the last 8760 hours.  Assessment and Plan: Poor venous access Patient with history of left lobular carcinoma of the breast [previously treated, now with lung mass and brain edema. Also with a left middle lobe lung mass.  IR consulted for Port-A-Cath placement at the request of Dr. Earlie Server.  Patient has been NPO as of 8AM. She does not take blood thinners.  Will discuss location of Port with Dr. Pascal Lux.   Risks and benefits of image guided port-a-catheter placement was discussed with the patient including, but not limited to bleeding, infection, pneumothorax, or fibrin sheath development and need for additional procedures.  All of  the patient's questions were answered, patient is agreeable to proceed. Consent signed and in chart.  Thank you for this interesting consult.  I greatly enjoyed meeting Diana Schwartz and look forward to participating in their care.  A copy of this report was sent to the requesting provider on this date.  Electronically Signed: Docia Barrier, PA 10/25/2017, 2:19 PM   I spent a total of 40 Minutes    in face to face in clinical consultation, greater than 50% of which was counseling/coordinating care for poor venous access.

## 2017-10-25 NOTE — Consult Note (Addendum)
Radiation Oncology         [phone] ________________________________  Initial inpatient Consultation  Name: Diana Schwartz MRN: [id number]  Date of Service: 10/25/17         DOB: 1966/01/18  UT:MLYY, Dwyane Luo, MD  No ref. provider found   REFERRING PHYSICIAN: Dr. Alfredia Ferguson  DIAGNOSIS: The encounter diagnosis was Brain metastases [facility]).    ICD-10-CM   1. Brain metastases ([place]) C79.31     HISTORY OF PRESENT ILLNESS: Diana Schwartz is a 52 y.o. female seen at the request of Dr. Julien Nordmann for a history of breast cancer with newly noted progression. The patient was originally diagnosed with left invasive lobular carcinoma in 2013 and underwent bilateral mastectomies. She developed pneumonia and was treated with antibiotics. Despite this, persistent changes were noted on imaging, and 10/06/17 a follow up CT revealed a  8.4 cm mass in the left lower lobe, and bulky hilar and mediastinal adenopathy. PET scan on 10/19/17 revealed hypermetabolism in the cervical/thoracic nodes, left adrenal gland and bony metastases. She has concerns with lymphagitic disease as well. MRI of the brain was recommended as well due to hypoattenuation within the right frontal and temporal lobes. She was having progressive headaches and was sent for MRI of the brain on 10/23/17 which revealed approximately 22 metastatic appearing lesions within the cerebral cortex and cerebellum.  The largest of these are surrounded by vasogenic edema and there is a trace leftward shift.  Mild mass-effect on the lateral ventricles was noted without ventriculomegaly. She was admitted and started on dexamethasone and is on 6 mg q 6 hours. She is scheduled for a PAC placement today and brochoscopy on 10/27/17.     PREVIOUS RADIATION THERAPY: No  PAST MEDICAL HISTORY:  Past Medical History:  Diagnosis Date  . Anxiety   . Autoimmune hepatitis ([place])    no current med.  . Breast cancer [facility]) 2013   Left breast cancer. Treated at [facility]. Per  notes, nvasive lobular carcinoma. S/P bilateral mastectomy with recontruction and Tamoxifen X5 years.   . Carpal tunnel syndrome of left wrist   . Degenerative disc disease, lumbar   . Depression   . Fibromyalgia   . Lupus (systemic lupus erythematosus) ([place])   . Osteoarthritis   . Rash    arms, chest - due to lupus  . Raynaud disease   . Rheumatoid arthritis ([place])   . Sjoegren syndrome ([place])       PAST SURGICAL HISTORY: Past Surgical History:  Procedure Laterality Date  . ANTERIOR CERVICAL DECOMP/DISCECTOMY FUSION  02/06/2004   C5-6, C6-7  . AXILLARY NODE DISSECTION Left 08/2012  . BREAST RECONSTRUCTION  06/2013  . CARPAL TUNNEL RELEASE Right 1989  . CARPAL TUNNEL RELEASE Left 10/02/2013   Procedure: LEFT CARPAL TUNNEL RELEASE;  Surgeon: Linna Hoff, MD;  Location: [facility];  Service: Orthopedics;  Laterality: Left;  Local Anesthesia with IV Sedation  . FOOT SURGERY Right    bunion and broke toe and then realign toe  . MASTECTOMY Bilateral 08/12/2012   LEFT BREAST CANCER  . thoracic back surgery  05/2015  . VAGINAL HYSTERECTOMY  1999    FAMILY HISTORY:  Family History  Problem Relation Age of Onset  . Lung cancer Mother   . Hypertension Mother   . Lung cancer Father   . Rheum arthritis Father   . Hypertension Father   . Colon cancer Neg Hx     SOCIAL HISTORY:  Social History  Socioeconomic History  . Marital status: Married    Spouse name: Ronalee Belts  . Number of children: 1  . Years of education: Not on file  . Highest education level: Not on file  Social Needs  . Financial resource strain: Not on file  . Food insecurity - worry: Not on file  . Food insecurity - inability: Not on file  . Transportation needs - medical: Not on file  . Transportation needs - non-medical: Not on file  Occupational History  . Occupation: disability  Tobacco Use  . Smoking status: Former Smoker    Last attempt to quit: 05/24/2012    Years since quitting: 5.4  .  Smokeless tobacco: Never Used  Substance and Sexual Activity  . Alcohol use: Yes    Alcohol/week: 0.0 oz    Comment: socially  . Drug use: No  . Sexual activity: Not on file  Other Topics Concern  . Not on file  Social History Narrative  . Not on file    ALLERGIES: Oxycodone; Doxycycline; and Prednisone  MEDICATIONS:  Current Facility-Administered Medications  Medication Dose Route Frequency Provider Last Rate Last Dose  . 0.9 %  sodium chloride infusion   Intravenous Continuous Benito Mccreedy, MD 75 mL/hr at 10/24/17 2159    . acetaminophen (TYLENOL) tablet 650 mg  650 mg Oral Q6H PRN Sheikh, Omair Latif, DO      . ALPRAZolam Duanne Moron) tablet 0.5 mg  0.5 mg Oral QHS PRN Benito Mccreedy, MD   0.5 mg at 10/24/17 2211  . azaTHIOprine (IMURAN) tablet 50 mg  50 mg Oral BID Benito Mccreedy, MD   50 mg at 10/24/17 2159  . bisacodyl (DULCOLAX) suppository 10 mg  10 mg Rectal Daily PRN Osei-Bonsu, Iona Beard, MD      . cholecalciferol (VITAMIN D) tablet 1,000 Units  1,000 Units Oral Daily Benito Mccreedy, MD   1,000 Units at 10/24/17 1012  . dexamethasone (DECADRON) injection 6 mg  6 mg Intravenous Q6H Sheikh, Omair [place], DO   6 mg at 10/25/17 3007  . hydroxychloroquine (PLAQUENIL) tablet 200 mg  200 mg Oral BID Benito Mccreedy, MD   200 mg at 10/24/17 2159  . Influenza vac split quadrivalent PF (FLUARIX) injection 0.5 mL  0.5 mL Intramuscular Tomorrow-1000 Osei-Bonsu, George, MD      . ondansetron (ZOFRAN) tablet 4 mg  4 mg Oral Q6H PRN Osei-Bonsu, Iona Beard, MD       Or  . ondansetron (ZOFRAN) injection 4 mg  4 mg Intravenous Q6H PRN Osei-Bonsu, Iona Beard, MD      . pantoprazole (PROTONIX) EC tablet 40 mg  40 mg Oral BID Sheikh, Omair Latif, DO   40 mg at 10/24/17 2200  . traMADol (ULTRAM) tablet 50 mg  50 mg Oral Q6H PRN Raiford [place], DO   50 mg at 10/24/17 2212  . venlafaxine XR (EFFEXOR-XR) 24 hr capsule 300 mg  300 mg Oral Q breakfast Osei-Bonsu, George, MD   300 mg at  10/25/17 0848    REVIEW OF SYSTEMS:  On review of systems, the patient reports that she is doing well overall now that she's been on steroids. She denies any chest pain, shortness of breath while resting, cough, fevers, chills, night sweats, unintended weight changes. She denies any bowel or bladder disturbances, and denies abdominal pain, nausea or vomiting. She denies any new musculoskeletal or joint aches or pains. A complete review of systems is obtained and is otherwise negative.    PHYSICAL EXAM:  Wt Readings from  Last 3 Encounters:  10/23/17 120 lb 14.4 oz (54.8 kg)  10/22/17 119 lb 6.4 oz (54.2 kg)  08/04/17 127 lb 12.8 oz (58 kg)   Temp Readings from Last 3 Encounters:  10/25/17 97.8 F (36.6 C) (Oral)  10/22/17 99 F (37.2 C) (Oral)  11/26/15 97.5 F (36.4 C) (Temporal)   BP Readings from Last 3 Encounters:  10/25/17 133/73  10/22/17 (!) 139/114  08/04/17 98/66   Pulse Readings from Last 3 Encounters:  10/25/17 73  10/22/17 82  08/04/17 84   Pain Assessment Pain Score: Asleep/10  In general this is a well appearing caucasian female in no acute distress. She is alert and oriented x4 and appropriate throughout the examination. HEENT reveals that the patient is normocephalic, atraumatic. EOMs are intact. PERRLA. Skin is intact without any evidence of gross lesions. Cardiovascular exam reveals a regular rate and rhythm, no clicks rubs or murmurs are auscultated. Chest is clear to auscultation in the right fields but decreased in the left mid and lowe lung fields. Lymphatic assessment is performed and does not reveal any adenopathy in the cervical, supraclavicular, axillary, or inguinal chains. Abdomen has active bowel sounds in all quadrants and is intact. The abdomen is soft, non tender, non distended. Lower extremities are negative for pretibial pitting edema, deep calf tenderness, cyanosis or clubbing.   KPS =70  100 - Normal; no complaints; no evidence of disease. 90    - Able to carry on normal activity; minor signs or symptoms of disease. 80   - Normal activity with effort; some signs or symptoms of disease. 24   - Cares for self; unable to carry on normal activity or to do active work. 60   - Requires occasional assistance, but is able to care for most of his personal needs. 50   - Requires considerable assistance and frequent medical care. 35   - Disabled; requires special care and assistance. 38   - Severely disabled; hospital admission is indicated although death not imminent. 13   - Very sick; hospital admission necessary; active supportive treatment necessary. 10   - Moribund; fatal processes progressing rapidly. 0     - Dead  Karnofsky DA, Abelmann [place], Craver LS and Burchenal [facility] [phone]) The use of the nitrogen mustards in the palliative treatment of carcinoma: with particular reference to bronchogenic carcinoma Cancer 1 [id number]  LABORATORY DATA:  Lab Results  Component Value Date   WBC 12.6 (H) 10/25/2017   HGB 11.8 (L) 10/25/2017   HCT 35.7 (L) 10/25/2017   MCV 102.3 (H) 10/25/2017   PLT 397 10/25/2017   Lab Results  Component Value Date   NA 141 10/25/2017   K 4.0 10/25/2017   CL 109 10/25/2017   CO2 24 10/25/2017   Lab Results  Component Value Date   ALT 37 10/25/2017   AST 80 (H) 10/25/2017   ALKPHOS 124 10/25/2017   BILITOT 0.3 10/25/2017     RADIOGRAPHY: Ct Chest Wo Contrast  Addendum Date: 10/12/2017   ADDENDUM REPORT: 10/12/2017 21:51 ADDENDUM: These results will be called to the ordering clinician or representative by the Radiologist Assistant, and communication documented in the PACS or zVision Dashboard. Electronically Signed   By: Fidela Salisbury M.D.   On: 10/12/2017 21:51   Result Date: 10/12/2017 CLINICAL DATA:  Evaluate pneumonia post multiple course of antibiotics. EXAM: CT CHEST WITHOUT CONTRAST TECHNIQUE: Multidetector CT imaging of the chest was performed following the standard protocol without IV contrast.  COMPARISON:  Chest  x-ray 10/06/2017 history of left breast cancer. FINDINGS: Cardiovascular: No significant vascular findings. Normal heart size. No pericardial effusion. Mediastinum/Nodes: Mediastinal lymphadenopathy with lymph nodes measuring up to 19 mm in short axis. Bulky left hilar lymphadenopathy. Normal trachea and esophagus. 1.5 cm hypoattenuated mass within the right lobe of the thyroid gland. Lungs/Pleura: Mild upper lobe predominant emphysematous changes. Bilobed low-attenuation soft tissue mass in the lingula measures 8.4 by 5.6 by 5.5 cm and abuts the pericardium and anterior mediastinum medially and the pleural surface laterally. There is probable extension along the pleural surface. Surrounding interstitial thickening suspicious for lymphangitic spread of tumor. Small left pleural effusion. Several less than 5 mm pulmonary nodules in the right upper lobe. Upper Abdomen: No acute abnormality. Musculoskeletal: Lower thoracic and lower cervical fusion with intact hardware. No evidence of suspicious lesions. Post bilateral mastectomy and implant reconstruction. Post left axillary lymph node dissection. IMPRESSION: Left middle lobe heterogeneous possibly necrotic soft tissue mass, measuring up to 8.4 cm, highly suspicious for malignancy. The mass abuts the mediastinal border/pericardium medially and the pleural surface laterally. Suspected pleural extension of tumor, as well as lymphangitic spread of malignancy in the rest of the lung parenchyma of the lingula and portions of the left upper lobe. Tissue diagnosis and PET-CT may be considered. Bulky left hilar lymphadenopathy and mediastinal lymphadenopathy. Few less than 5 mm solid and ground-glass nodules in the right upper lobe with indeterminate significance. 1.5 cm right thyroid mass. Post bilateral mastectomy with implant reconstruction. Post left axillary lymph node dissection. Determined left axillary lymph nodes, which could be further evaluated  with focused axillary ultrasound. Emphysema (ICD10-J43.9). Electronically Signed: By: Fidela Salisbury M.D. On: 10/12/2017 20:20   Mr Jeri Cos GY Contrast  Addendum Date: 10/23/2017   ADDENDUM REPORT: 10/23/2017 12:32 ADDENDUM: Study discussed by telephone with Dr. Irene Limbo, who is on-call for provider Mikey Bussing, on 10/23/2017 at 1227 hours. Electronically Signed   By: Genevie Ann M.D.   On: 10/23/2017 12:32   Result Date: 10/23/2017 CLINICAL DATA:  52 year old female with metastatic bronchogenic carcinoma diagnosed recently on PET-CT. Abnormal CT appearance of the right hemisphere on the recent PET images. Headaches. Staging. EXAM: MRI HEAD WITHOUT AND WITH CONTRAST TECHNIQUE: Multiplanar, multiecho pulse sequences of the brain and surrounding structures were obtained without and with intravenous contrast. CONTRAST:  44mL MULTIHANCE GADOBENATE DIMEGLUMINE 529 MG/ML IV SOLN COMPARISON:  PET-CT 10/19/2017.  Cervical spine MRI 12/09/2003. FINDINGS: Brain: Numerous enhancing brain metastases scattered in both hemispheres and the right cerebellum. Approximately 21 individual metastases are identified, ranging from punctate to 16 millimeters diameter. All lesions are annotated on series 11. The largest lesions are surrounded by vasogenic edema, and/or in the anterior superior right frontal lobe, posterior left frontal and parietal lobe, anterior inferior right frontal lobe, and several adjacent lesions in the right temporal lobe (including the largest on series 11, image 22. There is trace leftward midline shift. Basilar cisterns remain widely patent. There is mild mass effect on the lateral ventricles with no ventriculomegaly. None of the metastasis appear to be hemorrhagic. Many demonstrate restricted diffusion compatible with hyper cellularity. No associated leptomeningeal or other dural thickening. Scattered superimposed small nonenhancing nonspecific cerebral white matter T2 and FLAIR hyperintense foci. Mild patchy  nonspecific T2 hyperintensity in the pons. No extra-axial collection or acute intracranial hemorrhage. Cervicomedullary junction and pituitary are within normal limits. Vascular: Major intracranial vascular flow voids are preserved. The major dural venous sinuses are enhancing and appear patent. Skull and upper cervical spine: Cervical  spine degeneration but superimposed suspicious marrow signal in the anterior C4 vertebral body, which seems to correspond to abnormal PET activity on 10/19/2017 (series 3, image 12 today). Negative visible cervical spinal cord. No suspicious calvarium marrow signal. Sinuses/Orbits: Orbit soft tissues are within normal limits sinus mucosal thickening. There is moderate widespread paranasal. Other: Visible internal auditory structures appear normal. Mastoid air cells are clear. Trace retained secretions in the nasopharynx. Scalp and face soft tissues appear negative. IMPRESSION: 1. Positive for widespread metastatic disease to the brain. Approximately 22 individual metastases are identified ranging from punctate to 16 mm diameter. 2. Multifocal associated vasogenic edema in the cerebral hemispheres, although there is only trace associated leftward midline shift and there is no ventriculomegaly or impending herniation. 3. Small C4 vertebral body metastasis suspected. No skull metastasis identified. Electronically Signed: By: Genevie Ann M.D. On: 10/23/2017 11:34   Nm Pet Image Initial (pi) Skull Base To Thigh  Result Date: 10/20/2017 CLINICAL DATA:  Initial treatment strategy for history of breast cancer. Left upper lobe/lingular mass. EXAM: NUCLEAR MEDICINE PET SKULL BASE TO THIGH TECHNIQUE: 5.9 mCi F-18 FDG was injected intravenously. Full-ring PET imaging was performed from the skull base to thigh after the radiotracer. CT data was obtained and used for attenuation correction and anatomic localization. FASTING BLOOD GLUCOSE:  Value: 100 mg/dl COMPARISON:  Chest CT of 10/12/2017.   Abdominal CT of 08/13/2017. FINDINGS: Mediastinal blood pool activity: SUV max equal 2.2 NECK: Low left jugular/supraclavicular tiny node corresponds to hypermetabolism. This measures 4 mm and a S.U.V. max of 3.4 on image 39/4. Incidental CT findings: No cervical adenopathy. Right frontal and temporal lobe hypoattenuation, including on image 1/4. Suboptimally evaluated. Nonspecific right thyroid hypoattenuating nodule of 10 mm. CHEST: Centrally necrotic lingular primary measures on the order of 5.8 x 5.7 cm and a S.U.V. max of 13.9 on image 35/8. Surrounding ground-glass and airspace disease is favored to represent postobstructive pneumonitis versus lymphangitic tumor spread. Tiny left pleural effusion demonstrates nonspecific low-level hypermetabolism. Bilateral mediastinal nodal metastasis, including a right paratracheal node which measures 1.9 cm and a S.U.V. max of 18.1 on image 62/4. Incidental CT findings: Bilateral breast implants. Left axillary node dissection. Nonspecific bilateral pulmonary nodules, below PET resolution. ABDOMEN/PELVIS: Hypermetabolic left adrenal nodule measures 2.4 cm and a S.U.V. max of 11.5 on image 107/4. No other abnormal soft tissue hypermetabolism identified. Incidental CT findings: Abdominal aortic atherosclerosis. Hysterectomy. SKELETON: Left humeral head lesion measures 8 mm and a S.U.V. max of 7.1 on image 38/4. A left acetabular lesion is CT occult and measures a S.U.V. max of 5.9. Incidental CT findings: Lumbar spine fixation. Cervical spine fixation. IMPRESSION: 1. Hypermetabolic lingular mass with cervical/thoracic nodal, left adrenal, and bone metastasis. Most consistent with primary bronchogenic carcinoma. 2. Tiny left pleural effusion with nonspecific low-level hypermetabolism. 3. Postobstructive pneumonitis and/or lymphangitic tumor spread more peripheral within the lingula. 4. Hypoattenuation within the right frontal and temporal lobes is suboptimally evaluated.  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 If not previously evaluated, consider further evaluation with pre and post-contrast brain MR. 5.  Aortic Atherosclerosis (ICD10-I70.0). Electronically Signed   By: Abigail Miyamoto M.D.   On: 10/20/2017 08:43      IMPRESSION/PLAN: 1. 52 y.o. female with probable metastatic lung cancer.  The patient is counseled on the findings radiographically.  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  We discussed the findings of the brain and chest, and reviewed the rationale  for palliative radiotherapy to the brain, and discussion of either palliative radiotherapy to the chest versus concurrent treatment depending on her histology, and Dr. Johny Shears perspective on extent of disease.  She is proceeding with biopsy on Wednesday and we are hopeful that we can get stat turnaround on pathology to confirm a diagnosis of cancer before proceeding.  We discussed the risks, benefits and long and short effects of radiotherapy to the brain and to the chest.  She will simulate for whole brain radiation tomorrow around 1:00, and simulate for the chest as well.  We discussed that we would hope she would be able to continue to remain inpatient while she gets formal workup, and get started on treatment with radiation however that will be up to the medical team overseeing her care.  We would recommend continuing dexamethasone during her hospitalization, and it seems to have improved some of her symptoms.  We will taper these once she completes whole brain radiation.   2. Lupus.  We did discuss the risks that her lupus and Sjogren's syndrome put her out for flares of her rheumatologic conditions regarding radiotherapy however she is in agreement to proceed and states that she is willing to take on these risks.  In a visit lasting 70 minutes, greater than 50% of the time was spent face to face discussing her case and in floor time coordinating the patient's  care.     Carola Rhine, PAC Seen on behalf of  Sheral Apley. Tammi Klippel, MD  Page Me

## 2017-10-25 NOTE — Progress Notes (Signed)
Subjective: The patient is seen and examined today.  Her husband was at the bedside.  This is a very pleasant 52 years old white female who was admitted with multiple brain metastasis as well as intermittent headache.  The patient was a started on high-dose Decadron and she is feeling much better today.  She denied having any current fever or chills.  She has no nausea or vomiting.  Objective: Vital signs in last 24 hours: Temp:  [97.8 F (36.6 C)-98.6 F (37 C)] 97.8 F (36.6 C) (03/04 0536) Pulse Rate:  [68-74] 73 (03/04 0536) Resp:  [16-20] 20 (03/04 0536) BP: (110-133)/(60-79) 133/73 (03/04 0536) SpO2:  [95 %-100 %] 100 % (03/04 0536)  Intake/Output from previous day: 03/03 0701 - 03/04 0700 In: 2310 [I.V.:2310] Out: -  Intake/Output this shift: No intake/output data recorded.  General appearance: alert, cooperative, fatigued and no distress Resp: diminished breath sounds LUL and dullness to percussion LUL Cardio: regular rate and rhythm, S1, S2 normal, no murmur, click, rub or gallop GI: soft, non-tender; bowel sounds normal; no masses,  no organomegaly Extremities: extremities normal, atraumatic, no cyanosis or edema  Lab Results:  Recent Labs    10/24/17 0433 10/25/17 0422  WBC 5.3 12.6*  HGB 11.2* 11.8*  HCT 34.5* 35.7*  PLT 380 397   BMET Recent Labs    10/24/17 0433 10/25/17 0422  NA 136 141  K 4.4 4.0  CL 104 109  CO2 25 24  GLUCOSE 128* 122*  BUN 12 7  CREATININE 0.52 0.46  CALCIUM 8.7* 8.8*    Studies/Results: Mr Jeri Cos YB Contrast  Addendum Date: 10/23/2017   ADDENDUM REPORT: 10/23/2017 12:32 ADDENDUM: Study discussed by telephone with Dr. Irene Limbo, who is on-call for provider Mikey Bussing, on 10/23/2017 at 1227 hours. Electronically Signed   By: Genevie Ann M.D.   On: 10/23/2017 12:32   Result Date: 10/23/2017 CLINICAL DATA:  52 year old female with metastatic bronchogenic carcinoma diagnosed recently on PET-CT. Abnormal CT appearance of the right  hemisphere on the recent PET images. Headaches. Staging. EXAM: MRI HEAD WITHOUT AND WITH CONTRAST TECHNIQUE: Multiplanar, multiecho pulse sequences of the brain and surrounding structures were obtained without and with intravenous contrast. CONTRAST:  31mL MULTIHANCE GADOBENATE DIMEGLUMINE 529 MG/ML IV SOLN COMPARISON:  PET-CT 10/19/2017.  Cervical spine MRI 12/09/2003. FINDINGS: Brain: Numerous enhancing brain metastases scattered in both hemispheres and the right cerebellum. Approximately 21 individual metastases are identified, ranging from punctate to 16 millimeters diameter. All lesions are annotated on series 11. The largest lesions are surrounded by vasogenic edema, and/or in the anterior superior right frontal lobe, posterior left frontal and parietal lobe, anterior inferior right frontal lobe, and several adjacent lesions in the right temporal lobe (including the largest on series 11, image 22. There is trace leftward midline shift. Basilar cisterns remain widely patent. There is mild mass effect on the lateral ventricles with no ventriculomegaly. None of the metastasis appear to be hemorrhagic. Many demonstrate restricted diffusion compatible with hyper cellularity. No associated leptomeningeal or other dural thickening. Scattered superimposed small nonenhancing nonspecific cerebral white matter T2 and FLAIR hyperintense foci. Mild patchy nonspecific T2 hyperintensity in the pons. No extra-axial collection or acute intracranial hemorrhage. Cervicomedullary junction and pituitary are within normal limits. Vascular: Major intracranial vascular flow voids are preserved. The major dural venous sinuses are enhancing and appear patent. Skull and upper cervical spine: Cervical spine degeneration but superimposed suspicious marrow signal in the anterior C4 vertebral body, which seems to correspond to  abnormal PET activity on 10/19/2017 (series 3, image 12 today). Negative visible cervical spinal cord. No  suspicious calvarium marrow signal. Sinuses/Orbits: Orbit soft tissues are within normal limits sinus mucosal thickening. There is moderate widespread paranasal. Other: Visible internal auditory structures appear normal. Mastoid air cells are clear. Trace retained secretions in the nasopharynx. Scalp and face soft tissues appear negative. IMPRESSION: 1. Positive for widespread metastatic disease to the brain. Approximately 22 individual metastases are identified ranging from punctate to 16 mm diameter. 2. Multifocal associated vasogenic edema in the cerebral hemispheres, although there is only trace associated leftward midline shift and there is no ventriculomegaly or impending herniation. 3. Small C4 vertebral body metastasis suspected. No skull metastasis identified. Electronically Signed: By: Genevie Ann M.D. On: 10/23/2017 11:34    Medications: I have reviewed the patient's current medications.   Assessment/Plan: This is a very pleasant 52 years old white female with highly suspicious metastatic lung cancer, pending tissue diagnosis.  She presented with large lingular mass with cervical as well as thoracic nodal metastasis in addition to left adrenal and bone metastasis.  The patient was also found to have multiple metastatic brain lesions on staging MRI of the brain. I had a lengthy discussion with the patient and her husband today about her condition. I recommended for her to continue her current treatment with Decadron and we will taper her dose gradually after discharge and starting her radiotherapy to the brain.  The patient may also benefit from palliative radiotherapy to the large lingular mass during her brain radiation. I will consult Dr. Vaughan Browner for consideration of bronchoscopy as well as endobronchial ultrasound and biopsy for tissue diagnosis. If the final pathology is consistent with non-small cell lung cancer, adenocarcinoma, we will send the tissue block for molecular studies and PDL 1  expression but the behavior of her tumor is highly suspicious for small cell carcinoma. I will arrange for the patient a follow-up visit with me at the cancer center after discharge for more detailed discussion of her treatment options after the tissue biopsy. Thank you for taking good care of Ms. Archibeque, I would continue to follow-up the patient with you and assist in her management on as-needed basis.   LOS: 2 days    Eilleen Kempf 10/25/2017

## 2017-10-25 NOTE — Progress Notes (Signed)
Oncology Nurse Navigator Documentation  Oncology Nurse Navigator Flowsheets 10/25/2017  Navigator Location [facility]  Navigator Encounter Type Other/Dr. Julien Nordmann updated me that patient recent admission and needing referral to [facility].  I completed referral.   Patient Visit Type Inpatient  Treatment Phase Abnormal Scans  Barriers/Navigation Needs Coordination of Care  Interventions Coordination of Care  Coordination of Care Other  Acuity Level 2  Acuity Level 2 Referrals such as genetics, survivorship  Time Spent with Patient 15

## 2017-10-25 NOTE — Discharge Instructions (Signed)
Moderate Conscious Sedation, Adult, Care After °These instructions provide you with information about caring for yourself after your procedure. Your health care provider may also give you more specific instructions. Your treatment has been planned according to current medical practices, but problems sometimes occur. Call your health care provider if you have any problems or questions after your procedure. °What can I expect after the procedure? °After your procedure, it is common: °· To feel sleepy for several hours. °· To feel clumsy and have poor balance for several hours. °· To have poor judgment for several hours. °· To vomit if you eat too soon. ° °Follow these instructions at home: °For at least 24 hours after the procedure: ° °· Do not: °? Participate in activities where you could fall or become injured. °? Drive. °? Use heavy machinery. °? Drink alcohol. °? Take sleeping pills or medicines that cause drowsiness. °? Make important decisions or sign legal documents. °? Take care of children on your own. °· Rest. °Eating and drinking °· Follow the diet recommended by your health care provider. °· If you vomit: °? Drink water, juice, or soup when you can drink without vomiting. °? Make sure you have little or no nausea before eating solid foods. °General instructions °· Have a responsible adult stay with you until you are awake and alert. °· Take over-the-counter and prescription medicines only as told by your health care provider. °· If you smoke, do not smoke without supervision. °· Keep all follow-up visits as told by your health care provider. This is important. °Contact a health care provider if: °· You keep feeling nauseous or you keep vomiting. °· You feel light-headed. °· You develop a rash. °· You have a fever. °Get help right away if: °· You have trouble breathing. °This information is not intended to replace advice given to you by your health care provider. Make sure you discuss any questions you have  with your health care provider. °Document Released: 05/31/2013 Document Revised: 01/13/2016 Document Reviewed: 11/30/2015 °Elsevier Interactive Patient Education © 2018 [employer] °Implanted Port Home Guide °An implanted port is a type of central line that is placed under the skin. Central lines are used to provide IV access when treatment or nutrition needs to be given through a person’s veins. Implanted ports are used for long-term IV access. An implanted port may be placed because: °· You need IV medicine that would be irritating to the small veins in your hands or arms. °· You need long-term IV medicines, such as antibiotics. °· You need IV nutrition for a long period. °· You need frequent blood draws for lab tests. °· You need dialysis. ° °Implanted ports are usually placed in the chest area, but they can also be placed in the upper arm, the abdomen, or the leg. An implanted port has two main parts: °· Reservoir. The reservoir is round and will appear as a small, raised area under your skin. The reservoir is the part where a needle is inserted to give medicines or draw blood. °· Catheter. The catheter is a thin, flexible tube that extends from the reservoir. The catheter is placed into a large vein. Medicine that is inserted into the reservoir goes into the catheter and then into the vein. ° °How will I care for my incision site? °Do not get the incision site wet. Bathe or shower as directed by your health care provider. °How is my port accessed? °Special steps must be taken to access the port: °·   Before the port is accessed, a numbing cream can be placed on the skin. This helps numb the skin over the port site. °· Your health care provider uses a sterile technique to access the port. °? Your health care provider must put on a mask and sterile gloves. °? The skin over your port is cleaned carefully with an antiseptic and allowed to dry. °? The port is gently pinched between sterile gloves, and a needle is  inserted into the port. °· Only "non-coring" port needles should be used to access the port. Once the port is accessed, a blood return should be checked. This helps ensure that the port is in the vein and is not clogged. °· If your port needs to remain accessed for a constant infusion, a clear (transparent) bandage will be placed over the needle site. The bandage and needle will need to be changed every week, or as directed by your health care provider. °· Keep the bandage covering the needle clean and dry. Do not get it wet. Follow your health care provider’s instructions on how to take a shower or bath while the port is accessed. °· If your port does not need to stay accessed, no bandage is needed over the port. ° °What is flushing? °Flushing helps keep the port from getting clogged. Follow your health care provider’s instructions on how and when to flush the port. Ports are usually flushed with saline solution or a medicine called heparin. The need for flushing will depend on how the port is used. °· If the port is used for intermittent medicines or blood draws, the port will need to be flushed: °? After medicines have been given. °? After blood has been drawn. °? As part of routine maintenance. °· If a constant infusion is running, the port may not need to be flushed. ° °How long will my port stay implanted? °The port can stay in for as long as your health care provider thinks it is needed. When it is time for the port to come out, surgery will be done to remove it. The procedure is similar to the one performed when the port was put in. °When should I seek immediate medical care? °When you have an implanted port, you should seek immediate medical care if: °· You notice a bad smell coming from the incision site. °· You have swelling, redness, or drainage at the incision site. °· You have more swelling or pain at the port site or the surrounding area. °· You have a fever that is not controlled with  medicine. ° °This information is not intended to replace advice given to you by your health care provider. Make sure you discuss any questions you have with your health care provider. °Document Released: 08/10/2005 Document Revised: 01/16/2016 Document Reviewed: 04/17/2013 °Elsevier Interactive Patient Education © 2017 [employer] ° °

## 2017-10-25 NOTE — Progress Notes (Signed)
Initial Nutrition Assessment  DOCUMENTATION CODES:   Non-severe (moderate) malnutrition in context of chronic illness  INTERVENTION:   Premier Protein po daily, each supplement provides 160 kcal and 30 grams of protein.   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    Pt reports eating "healthy" but not large amounts at baseline. Pt reports consuming 1 meal per day, typically lunch. This meal consists of a vegetable plate or a salad with grilled chicken. Pt has noticed a decrease in appetite over the past 3 months r/t taste/smell changes. Pt consumes 1-2 Premier Protein shakes per day at home. Pt reports consuming a large muffin from [employer] this morning for breakfast. No other meal completion records available per chart.   Pt endorses weight loss of 20 lbs in 3 months. Pt reports a UBW of 140 lbs stating she weighed this during the summer, 6 months ago. Pt shows a decline in weight but recordings of weight over the past year are limited. Of weights recorded, pt with 5.6% weight loss in 3 months, not significant for time frame.   Pt amenable to nutritional supplementation while admitted. RD discussed the nutrition composition differences between Premier Protein and Ensure. Pt willing to try Ensure in the vanilla flavor, given the higher calorie content in attempt to promote  intake and limit amount of weight loss. RD promoted adequate calorie and protein consumption.   Labs reviewed; CBG 110-197, Albumin 2.8 Medications reviewed; vitamin D, IV Decadron, Protonix, Miralax, Senokot-S, NS at 75 mL/hr  NUTRITION - FOCUSED PHYSICAL EXAM:    Most Recent Value  Orbital Region  Mild depletion  Upper Arm Region  Moderate depletion  Thoracic and Lumbar Region  Moderate depletion  Buccal Region  Mild depletion  Temple Region  Mild depletion  Clavicle Bone Region  Moderate depletion  Clavicle and Acromion Bone Region  Moderate depletion  Scapular Bone Region  Unable to assess  Dorsal Hand  Moderate depletion  Patellar Region  Moderate depletion  Anterior Thigh Region  Severe depletion  Posterior Calf Region  Moderate depletion  Edema (RD Assessment)  None     Diet Order:  Diet regular Room service appropriate? Yes; Fluid consistency: Thin  EDUCATION NEEDS:   No education needs have been identified at this time  Skin:  Skin Assessment: Reviewed RN Assessment  Last BM:  10/22/17  Height:   Ht Readings from Last 1 Encounters:  10/23/17 5\' 2"  (1.575 m)   Weight:   Wt Readings from Last 1 Encounters:  10/23/17 120 lb 14.4 oz (54.8 kg)   Ideal Body Weight:  50 kg  BMI:  Body mass index is 22.11 kg/m.  Estimated Nutritional Needs:   Kcal:  1650-1850  Protein:  80-95 grams  Fluid:  >/= 1.6 L/d  Parks Ranger, MS, RDN, LDN 10/25/2017 1:35 PM

## 2017-10-26 ENCOUNTER — Ambulatory Visit
Admit: 2017-10-26 | Discharge: 2017-10-26 | Disposition: A | Discharge status: Home / Self Care | Payer: 59 | Source: Ambulatory Visit | Attending: Radiation Oncology | Admitting: Radiation Oncology

## 2017-10-26 DIAGNOSIS — C3412 Malignant neoplasm of upper lobe, left bronchus or lung: Secondary | ICD-10-CM | POA: Insufficient documentation

## 2017-10-26 DIAGNOSIS — Z510 Encounter for antineoplastic radiation therapy: Secondary | ICD-10-CM | POA: Insufficient documentation

## 2017-10-26 DIAGNOSIS — C7931 Secondary malignant neoplasm of brain: Secondary | ICD-10-CM | POA: Insufficient documentation

## 2017-10-26 DIAGNOSIS — L930 Discoid lupus erythematosus: Secondary | ICD-10-CM

## 2017-10-26 LAB — GLUCOSE, CAPILLARY
Glucose-Capillary: 97 mg/dL (ref 65–99)
Glucose-Capillary: 120 mg/dL — ABNORMAL HIGH (ref 65–99)
Glucose-Capillary: 131 mg/dL — ABNORMAL HIGH (ref 65–99)

## 2017-10-26 LAB — COMPREHENSIVE METABOLIC PANEL
ALBUMIN: 2.7 g/dL — AB (ref 3.5–5.0)
ALK PHOS: 102 U/L (ref 38–126)
ALT: 71 U/L — ABNORMAL HIGH (ref 14–54)
ANION GAP: 9 (ref 5–15)
AST: 245 U/L — ABNORMAL HIGH (ref 15–41)
BILIRUBIN TOTAL: 0.3 mg/dL (ref 0.3–1.2)
BUN: 13 mg/dL (ref 6–20)
CALCIUM: 8.5 mg/dL — AB (ref 8.9–10.3)
CO2: 26 mmol/L (ref 22–32)
CREATININE: 0.44 mg/dL (ref 0.44–1.00)
Chloride: 103 mmol/L (ref 101–111)
GFR calc Af Amer: >60 mL/min (ref >60)
GFR calc non Af Amer: >60 mL/min (ref >60)
Glucose, Bld: 109 mg/dL — ABNORMAL HIGH (ref 65–99)
Potassium: 4.2 mmol/L (ref 3.5–5.1)
Sodium: 138 mmol/L (ref 135–145)
TOTAL PROTEIN: 6.3 g/dL — AB (ref 6.5–8.1)

## 2017-10-26 LAB — CBC WITH DIFFERENTIAL/PLATELET
Basophils Absolute: 0 10*3/uL (ref 0.0–0.1)
Basophils Relative: 0 %
Eosinophils Absolute: 0 10*3/uL (ref 0.0–0.7)
Eosinophils Relative: 0 %
HEMATOCRIT: 33.8 % — AB (ref 36.0–46.0)
HEMOGLOBIN: 11.4 g/dL — AB (ref 12.0–15.0)
LYMPHS ABS: 0.6 10*3/uL — AB (ref 0.7–4.0)
Lymphocytes Relative: 5 %
MCH: 34.3 pg — AB (ref 26.0–34.0)
MCHC: 33.7 g/dL (ref 30.0–36.0)
MCV: 101.8 fL — ABNORMAL HIGH (ref 78.0–100.0)
Monocytes Absolute: 0.5 10*3/uL (ref 0.1–1.0)
Monocytes Relative: 4 %
NEUTROS ABS: 10.6 10*3/uL — AB (ref 1.7–7.7)
NEUTROS PCT: 91 %
Platelets: 397 10*3/uL (ref 150–400)
RBC: 3.32 MIL/uL — ABNORMAL LOW (ref 3.87–5.11)
RDW: 14.2 % (ref 11.5–15.5)
WBC: 11.7 10*3/uL — ABNORMAL HIGH (ref 4.0–10.5)

## 2017-10-26 LAB — PHOSPHORUS: Phosphorus: 3.9 mg/dL (ref 2.5–4.6)

## 2017-10-26 LAB — MAGNESIUM: Magnesium: 1.9 mg/dL (ref 1.7–2.4)

## 2017-10-26 MED ORDER — SODIUM CHLORIDE 0.9% FLUSH: 10.0000 mL | INTRAVENOUS | Status: DC | PRN

## 2017-10-26 NOTE — Progress Notes (Signed)
PROGRESS NOTE    Diana Schwartz  YWV:[id number] DOB: 12/09/1965 DOA: 10/23/2017 PCP: [name], MD   Brief Narrative:  Diana Schwartz is a 52 y.o. female with medical history significant for but not limited to left invasive lobular carcinoma diagnosed in 2013 status post bilateral mastectomy with reconstruction was seen by oncologist to the ED to admit her for IV steroids on account of recent MRI abnormality indicating brain lesions with echogenic edema and mild midline shift without evidence of impending herniation or hydrocephalus. She reports several weeks history of fatigue with intermittent headaches and recently noted unstable gait for which reason her oncologist ordered an MRI which came back abnormal and recommended she consider ED for admission for IV steroids and relation oncology evaluation on Monday. She does also have a Lung Mass that was supposed to be biopsied this coming week. She was admitted for Brain Metastasis with Vasogenic Edema and Mild Midline Shift and started on IV Dexamethasone. Medical Oncology and Radiation Oncology evaluated. Medical Oncology recommending Tissue Biopsy and Port Placement so Pulmonary and IR consulted. Port was placed yesterday and patient simulate whole brain radiation today as well as chest simulation. EBUS scheduled for tomorrow.   Assessment & Plan:   Active Problems:   Lupus   Autoimmune hepatitis ([place])   Breast cancer ([place])   Depression   Anxiety   Fibromyalgia   Lung mass   Brain edema ([facility])   Malnutrition of moderate degree  Brain Metastasis with vasogenic edema and mild Leftward Midline shift -Unclear Etiology but suspect from Lung Mass; Cancer type unknown at this point but patient getting EBUS on 10/27/17 for Tissue Sampling  -MRI showed Positive for widespread metastatic disease to the brain. Approximately 22 individual metastases are identified ranging from punctate to 16 mm diameter. Multifocal associated vasogenic  edema in the cerebral hemispheres, although there is only trace associated leftward midline shift and there is no ventriculomegaly or impending herniation. Small C4 vertebral body metastasis suspected. No skull metastasis identified. -IV Dexamethasone every 6 hours dose increased from 4 mg to 6 mg; Added PPI for GI Prophylaxis -D/C IV NS at 75 mL/hr -Continue to Monitor CBG's -Oncology evaluated today and recommending Port-A-Cath Placement and Lung Bx while inpatient -IR consulted for Port-A-Cath and was placed 10/25/17 -Radiation Oncology consulted and patient undergoing whole brain and chest Radiation Simulation today  -Oncology consulted [facility] who reviewed case and feel as if if Pulmonary can Biopsy Lung Mass; Dr. Julien Nordmann consulted Dr. Vaughan Browner from [facility] and PCCM planning on doing EBUS 10/27/17 -Symptoms of Headache have improved; Continue to Monitor -Continue to Follow Oncology Recc's; Oncology recommending tapering Dose of Decadron gradually after discharge and starting radiotherapy  -Biposy for tomorrow and if Final Pathology is consistent with non-small cell lung cancer, adenocarcinoma, the tissue block will be sent for molecular studies and PDL 1 Expression -Oncology arranging follow up at [facility] for a more detailed discussion of treatment options after tissue biopsy   Unsteady Gait and Generalized Weakness -Likely 2/2 to Above -PT/OT and recommending no follow up   Hypokalemia, improved -Due to poor oral intake suspected -K+ was 3.3 and improved after supplementation to 4.2 -Continue to Monitor and Replete as Necessary -Repeat CMP in AM   Hypoalbuminemia -Likely due to malignancy; Albumin was 2.7 -Checked Prealbumin and was 11.0 -Nutrition consult as below  Lung Mass with Metastasis  -Unknown what type of Cancer but suspecting non-small cell lung cancer, adenocarcinoma -MRI of the Brain was ordered to  complete staging work up and MRI as Above -Patient was to get CT-Guided  Core Biopsy of Hypermetabolic Lingular Mass as an outpatient but Oncology recommends Inpatient Biopsy and reviewed Case with CT Surgery who recommended that Pulmonary can do it -Pulmonary evaluated and arranging EBUS on 3/6 for tissue Sampling  -As Above  Hx of Breast Cancer (Invasive Lobular Carcinoma) -S/p Bilateral Mastectomy with Reconstruction and Tamoxifen x5 years -Oncology following and appreciate further evaluation and recc's   Depression and Anxiety -C/w Venlafaxine XR 300 mg po Daily  -C/w Alprazolam 0.5 mg po PRN Anxiety   Hx of Lupus, Rheumatoid Arthritis, Sjrogen Syndrome -C/w Azothioprine 50 mg po BID and Hydroxychloroquine 200 mg po BID -C/w Pain control with Tramadol 50 mg po q6hprn; Added Acetaiminophen 650 mg po q6hprn Mild/Moderate Pain   Autoimmune Hepatitis / Abnormal LFT's -? Worsened from Steroids?  -AST going up and went from 45 -> 63 -> 80 -> 245 -ALT went up to 71 -Continue to Monitor CMP -May need RUQ U/S to evaluate for any Hepatocellular Injury   Macrocytic Anemia -Hb/Hct now 11.4/33.8 -Check Anemia Panel in AM -Continue to Monitor for S/Sx of Bleeding -Repeat CBC in AM   Moderate Malnutrition in the Context of Chronic illness -Nutritionist consulted for further evaluation and recommendations -C/w Premier Protein po Daily -C/w Ensure Enlive po Daily  Leukocytosis -Patient's WBC went from 5.3 -> 12.6 -> 11.7 -In the setting of IV Steroid Demargination -Continue to Monitor for S/Sx of Infection -Repeat CBC in AM   Constipation, improved  -Added Miralax 17 grams po BID, Senna-Docusate 1 tab po BID, and Bisacodyl 10 mg RC Daily PRN  DVT prophylaxis: SCDs Code Status: FULL CODE Family Communication: Discussed with family at bedside Disposition Plan: [place] for further evaluation and recommendations  Consultants:   Medical Oncology Dr. Julien Nordmann  Radiation Oncology  IR for [facility]  Pulmonary for Lung Biopsy   Procedures:  None  Antimicrobials:  Anti-infectives (From admission, onward)   Start     Dose/Rate Route Frequency Ordered Stop   10/25/17 1626  ceFAZolin (ANCEF) 2-4 GM/100ML-% IVPB    Comments:  Sherle Poe   : cabinet override      10/25/17 1626 10/25/17 1822   10/25/17 1430  ceFAZolin (ANCEF) IVPB 2g/100 mL premix     2 g 200 mL/hr over 30 Minutes Intravenous On call 10/25/17 1421 10/25/17 1919   10/23/17 2200  hydroxychloroquine (PLAQUENIL) tablet 200 mg     200 mg Oral 2 times daily 10/23/17 1905       Subjective: Seen and examined and had a slight headache still. Had a bowel movement. No CP or SOB. Ready to go down for Brain and Chest Radiotherapy Simulation.   Objective: Vitals:   10/25/17 1715 10/25/17 1744 10/25/17 2115 10/26/17 0552  BP: 136/78 (!) 143/81 118/69 129/69  Pulse: 66 63 63 (!) 48  Resp: 18 18 18 18   Temp:   97.7 F (36.5 C) 98.6 F (37 C)  TempSrc:   Oral Oral  SpO2: 97% 95% 98% 97%  Weight:      Height:        Intake/Output Summary (Last 24 hours) at 10/26/2017 1530 Last data filed at 10/26/2017 3428 Gross per 24 hour  Intake 1565 ml  Output -  Net 1565 ml   Filed Weights   10/23/17 1900  Weight: 54.8 kg (120 lb 14.4 oz)   Examination: Physical Exam:  Constitutional: Thin Caucasian female in NAD appears calm  and comfortable  Eyes: Lids and conjunctivae normal ENMT: External Ears and nose appear normal. MMM Neck: Supple with no JVD Respiratory: CTAB; No appreciable wheezing/rales/rhonchi Cardiovascular: RRR; No m/r/g. No extremity edema Abdomen: Soft, NT, ND. Bowel Sounds Present GU: Deferred Musculoskeletal: No contractures; No cyanosis; Has a Port on Right side now Skin: Warm and Dry; No appreciable rashes or lesions on a limited skin eval Neurologic: CN 2-12 grossly intact. No appreciable deficits Psychiatric: Normal and pleasant mood and affect. Intact judgement and insight  Data Reviewed: I have personally reviewed following labs and imaging  studies  CBC: Recent Labs  Lab 10/22/17 0747 10/23/17 1502 10/24/17 0433 10/25/17 0422 10/26/17 0804  WBC 6.8 5.5 5.3 12.6* 11.7*  NEUTROABS 4.2 3.3 4.7 11.6* 10.6*  HGB  --  12.7 11.2* 11.8* 11.4*  HCT 39.0 37.8 34.5* 35.7* 33.8*  MCV 100.8 101.3* 103.0* 102.3* 101.8*  PLT 357 370 380 397 923   Basic Metabolic Panel: Recent Labs  Lab 10/22/17 0747 10/23/17 1502 10/24/17 0433 10/25/17 0422 10/26/17 0804  NA 135* 138 136 141 138  K 4.3 3.3* 4.4 4.0 4.2  CL 100 101 104 109 103  CO2 25 27 25 24 26   GLUCOSE 79 88 128* 122* 109*  BUN 6* 10 12 7 13   CREATININE 0.68 0.70 0.52 0.46 0.44  CALCIUM 9.5 8.9 8.7* 8.8* 8.5*  MG  --   --   --  2.0 1.9  PHOS  --   --   --  3.5 3.9   GFR: Estimated Creatinine Clearance: 65.1 mL/min (by C-G formula based on SCr of 0.44 mg/dL). Liver Function Tests: Recent Labs  Lab 10/22/17 0747 10/24/17 0433 10/25/17 0422 10/26/17 0804  AST 45* 63* 80* 245*  ALT 26 38 37 71*  ALKPHOS 158* 131* 124 102  BILITOT 0.5 0.2* 0.3 0.3  PROT 7.7 6.7 6.7 6.3*  ALBUMIN 3.0* 2.6* 2.8* 2.7*   No results for input(s): LIPASE, AMYLASE in the last 168 hours. No results for input(s): AMMONIA in the last 168 hours. Coagulation Profile: No results for input(s): INR, PROTIME in the last 168 hours. Cardiac Enzymes: No results for input(s): CKTOTAL, CKMB, CKMBINDEX, TROPONINI in the last 168 hours. BNP (last 3 results) No results for input(s): PROBNP in the last 8760 hours. HbA1C: No results for input(s): HGBA1C in the last 72 hours. CBG: Recent Labs  Lab 10/25/17 1205 10/25/17 1738 10/25/17 2112 10/26/17 0739 10/26/17 1216  GLUCAP 132* 104* 130* 97 120*   Lipid Profile: No results for input(s): CHOL, HDL, LDLCALC, TRIG, CHOLHDL, LDLDIRECT in the last 72 hours. Thyroid Function Tests: No results for input(s): TSH, T4TOTAL, FREET4, T3FREE, THYROIDAB in the last 72 hours. Anemia Panel: No results for input(s): VITAMINB12, FOLATE, FERRITIN, TIBC,  IRON, RETICCTPCT in the last 72 hours. Sepsis Labs: No results for input(s): PROCALCITON, LATICACIDVEN in the last 168 hours.  No results found for this or any previous visit (from the past 240 hour(s)).   Radiology Studies: Ir US Guide Vasc Access Right  Result Date: 10/25/2017 INDICATION: History of recurrent metastatic breast cancer. In need of durable intravenous access for chemotherapy administration. EXAM: IMPLANTED PORT A CATH PLACEMENT WITH ULTRASOUND AND FLUOROSCOPIC GUIDANCE COMPARISON:  PET-CT - 10/19/2017 MEDICATIONS: Ancef 2 gm IV; The antibiotic was administered within an appropriate time interval prior to skin puncture. ANESTHESIA/SEDATION: Moderate (conscious) sedation was employed during this procedure. A total of Versed 2 mg and Fentanyl 100 mcg was administered intravenously. Moderate Sedation Time: 24 minutes.  The patient's level of consciousness and vital signs were monitored continuously by radiology nursing throughout the procedure under my direct supervision. CONTRAST:  None FLUOROSCOPY TIME:  18 seconds (3 mGy) COMPLICATIONS: None immediate. PROCEDURE: The procedure, risks, benefits, and alternatives were explained to the patient. Questions regarding the procedure were encouraged and answered. The patient understands and consents to the procedure. The right neck and chest were prepped with chlorhexidine in a sterile fashion, and a sterile drape was applied covering the operative field. Maximum barrier sterile technique with sterile gowns and gloves were used for the procedure. A timeout was performed prior to the initiation of the procedure. Local anesthesia was provided with 1% lidocaine with epinephrine. After creating a small venotomy incision, a micropuncture kit was utilized to access the internal jugular vein. Real-time ultrasound guidance was utilized for vascular access including the acquisition of a permanent ultrasound image documenting patency of the accessed vessel. The  microwire was utilized to measure appropriate catheter length. A subcutaneous port pocket was then created along the upper chest wall utilizing a combination of sharp and blunt dissection. The pocket was irrigated with sterile saline. A single lumen thin power injectable port was chosen for placement. The 8 Fr catheter was tunneled from the port pocket site to the venotomy incision. The port was placed in the pocket. The external catheter was trimmed to appropriate length. At the venotomy, an 8 Fr peel-away sheath was placed over a guidewire under fluoroscopic guidance. The catheter was then placed through the sheath and the sheath was removed. Final catheter positioning was confirmed and documented with a fluoroscopic spot radiograph. The port was accessed with a Huber needle, aspirated and flushed. The venotomy site was closed with an interrupted 4-0 Vicryl suture. The port pocket incision was closed with interrupted 2-0 Vicryl suture and the skin was opposed with a running subcuticular 4-0 Vicryl suture. Dermabond and Steri-strips were applied to both incisions. Port a Catheter was left accessed for inpatient hospitalization. Dressings were placed. The patient tolerated the procedure well without immediate post procedural complication. FINDINGS: After catheter placement, the tip lies within the superior cavoatrial junction. The catheter aspirates and flushes normally and is ready for immediate use. IMPRESSION: Successful placement of a right internal jugular approach power injectable Port-A-Cath. Port a Catheter was left accessed and is ready for immediate use. Electronically Signed   By: Sandi Mariscal M.D.   On: 10/25/2017 17:30   Ir Fluoro Guide Port Insertion Right  Result Date: 10/25/2017 INDICATION: History of recurrent metastatic breast cancer. In need of durable intravenous access for chemotherapy administration. EXAM: IMPLANTED PORT A CATH PLACEMENT WITH ULTRASOUND AND FLUOROSCOPIC GUIDANCE COMPARISON:   PET-CT - 10/19/2017 MEDICATIONS: Ancef 2 gm IV; The antibiotic was administered within an appropriate time interval prior to skin puncture. ANESTHESIA/SEDATION: Moderate (conscious) sedation was employed during this procedure. A total of Versed 2 mg and Fentanyl 100 mcg was administered intravenously. Moderate Sedation Time: 24 minutes. The patient's level of consciousness and vital signs were monitored continuously by radiology nursing throughout the procedure under my direct supervision. CONTRAST:  None FLUOROSCOPY TIME:  18 seconds (3 mGy) COMPLICATIONS: None immediate. PROCEDURE: The procedure, risks, benefits, and alternatives were explained to the patient. Questions regarding the procedure were encouraged and answered. The patient understands and consents to the procedure. The right neck and chest were prepped with chlorhexidine in a sterile fashion, and a sterile drape was applied covering the operative field. Maximum barrier sterile technique with sterile gowns and gloves were  used for the procedure. A timeout was performed prior to the initiation of the procedure. Local anesthesia was provided with 1% lidocaine with epinephrine. After creating a small venotomy incision, a micropuncture kit was utilized to access the internal jugular vein. Real-time ultrasound guidance was utilized for vascular access including the acquisition of a permanent ultrasound image documenting patency of the accessed vessel. The microwire was utilized to measure appropriate catheter length. A subcutaneous port pocket was then created along the upper chest wall utilizing a combination of sharp and blunt dissection. The pocket was irrigated with sterile saline. A single lumen thin power injectable port was chosen for placement. The 8 Fr catheter was tunneled from the port pocket site to the venotomy incision. The port was placed in the pocket. The external catheter was trimmed to appropriate length. At the venotomy, an 8 Fr peel-away  sheath was placed over a guidewire under fluoroscopic guidance. The catheter was then placed through the sheath and the sheath was removed. Final catheter positioning was confirmed and documented with a fluoroscopic spot radiograph. The port was accessed with a Huber needle, aspirated and flushed. The venotomy site was closed with an interrupted 4-0 Vicryl suture. The port pocket incision was closed with interrupted 2-0 Vicryl suture and the skin was opposed with a running subcuticular 4-0 Vicryl suture. Dermabond and Steri-strips were applied to both incisions. Port a Catheter was left accessed for inpatient hospitalization. Dressings were placed. The patient tolerated the procedure well without immediate post procedural complication. FINDINGS: After catheter placement, the tip lies within the superior cavoatrial junction. The catheter aspirates and flushes normally and is ready for immediate use. IMPRESSION: Successful placement of a right internal jugular approach power injectable Port-A-Cath. Port a Catheter was left accessed and is ready for immediate use. Electronically Signed   By: Sandi Mariscal M.D.   On: 10/25/2017 17:30   Scheduled Meds: . azaTHIOprine  50 mg Oral BID  . cholecalciferol  1,000 Units Oral Daily  . dexamethasone  6 mg Intravenous Q6H  . feeding supplement (ENSURE ENLIVE)  237 mL Oral Q24H  . hydroxychloroquine  200 mg Oral BID  . pantoprazole  40 mg Oral BID  . polyethylene glycol  17 g Oral BID  . protein supplement shake  11 oz Oral Q24H  . senna-docusate  1 tablet Oral BID  . venlafaxine XR  300 mg Oral Q breakfast   Continuous Infusions: . sodium chloride 75 mL/hr at 10/26/17 0822    LOS: 3 days   Kerney Elbe, DO [facility] Pager [phone]  If 7PM-7AM, please contact night-coverage [email or web address] Password [email or web address] 10/26/2017, 3:30 PM

## 2017-10-26 NOTE — Progress Notes (Signed)
OT Cancellation Note  Patient Details Name: Diana Schwartz MRN: [id number] DOB: November 13, 1965   Cancelled Treatment:    Reason Eval/Treat Not Completed: Patient at procedure or test/ unavailable  [place], Thereasa Parkin 10/26/2017, 1:47 PM

## 2017-10-27 ENCOUNTER — Inpatient Hospital Stay (HOSPITAL_COMMUNITY): Payer: 59

## 2017-10-27 ENCOUNTER — Encounter (HOSPITAL_COMMUNITY)
Admission: EM | Disposition: A | Discharge status: Home / Self Care | Payer: Self-pay | Source: Home / Self Care | Attending: Internal Medicine

## 2017-10-27 ENCOUNTER — Encounter (HOSPITAL_COMMUNITY): Payer: Self-pay | Admitting: *Deleted

## 2017-10-27 ENCOUNTER — Inpatient Hospital Stay (HOSPITAL_COMMUNITY): Payer: 59 | Admitting: Certified Registered Nurse Anesthetist

## 2017-10-27 DIAGNOSIS — C7931 Secondary malignant neoplasm of brain: Principal | ICD-10-CM

## 2017-10-27 DIAGNOSIS — R748 Abnormal levels of other serum enzymes: Secondary | ICD-10-CM

## 2017-10-27 DIAGNOSIS — Z9889 Other specified postprocedural states: Secondary | ICD-10-CM

## 2017-10-27 HISTORY — PX: ENDOBRONCHIAL ULTRASOUND: SHX5096

## 2017-10-27 LAB — CBC WITH DIFFERENTIAL/PLATELET
BASOS PCT: 0 %
Basophils Absolute: 0 10*3/uL (ref 0.0–0.1)
EOS ABS: 0 10*3/uL (ref 0.0–0.7)
EOS PCT: 0 %
HCT: 34.5 % — ABNORMAL LOW (ref 36.0–46.0)
Hemoglobin: 11.3 g/dL — ABNORMAL LOW (ref 12.0–15.0)
Lymphocytes Relative: 7 %
Lymphs Abs: 0.7 10*3/uL (ref 0.7–4.0)
MCH: 33.2 pg (ref 26.0–34.0)
MCHC: 32.8 g/dL (ref 30.0–36.0)
MCV: 101.5 fL — ABNORMAL HIGH (ref 78.0–100.0)
Monocytes Absolute: 0.7 10*3/uL (ref 0.1–1.0)
Monocytes Relative: 6 %
NEUTROS PCT: 87 %
Neutro Abs: 8.8 10*3/uL — ABNORMAL HIGH (ref 1.7–7.7)
PLATELETS: 382 10*3/uL (ref 150–400)
RBC: 3.4 MIL/uL — AB (ref 3.87–5.11)
RDW: 14 % (ref 11.5–15.5)
WBC: 10.2 10*3/uL (ref 4.0–10.5)

## 2017-10-27 LAB — COMPREHENSIVE METABOLIC PANEL
ALBUMIN: 2.5 g/dL — AB (ref 3.5–5.0)
ALT: 122 U/L — ABNORMAL HIGH (ref 14–54)
ANION GAP: 8 (ref 5–15)
AST: 272 U/L — ABNORMAL HIGH (ref 15–41)
Alkaline Phosphatase: 103 U/L (ref 38–126)
BUN: 15 mg/dL (ref 6–20)
CHLORIDE: 102 mmol/L (ref 101–111)
CO2: 29 mmol/L (ref 22–32)
Calcium: 8.5 mg/dL — ABNORMAL LOW (ref 8.9–10.3)
Creatinine, Ser: 0.52 mg/dL (ref 0.44–1.00)
GFR calc non Af Amer: >60 mL/min (ref >60)
GLUCOSE: 109 mg/dL — AB (ref 65–99)
Potassium: 4.2 mmol/L (ref 3.5–5.1)
SODIUM: 139 mmol/L (ref 135–145)
Total Bilirubin: 0.3 mg/dL (ref 0.3–1.2)
Total Protein: 6.1 g/dL — ABNORMAL LOW (ref 6.5–8.1)

## 2017-10-27 LAB — GLUCOSE, CAPILLARY
GLUCOSE-CAPILLARY: 114 mg/dL — AB (ref 65–99)
GLUCOSE-CAPILLARY: 101 mg/dL — AB (ref 65–99)
GLUCOSE-CAPILLARY: 117 mg/dL — AB (ref 65–99)
GLUCOSE-CAPILLARY: 201 mg/dL — AB (ref 65–99)
Glucose-Capillary: 93 mg/dL (ref 65–99)

## 2017-10-27 LAB — PROTIME-INR
INR: 1.03
PROTHROMBIN TIME: 13.5 s (ref 11.4–15.2)

## 2017-10-27 LAB — MAGNESIUM: Magnesium: 2 mg/dL (ref 1.7–2.4)

## 2017-10-27 LAB — PHOSPHORUS: PHOSPHORUS: 4.1 mg/dL (ref 2.5–4.6)

## 2017-10-27 SURGERY — ENDOBRONCHIAL ULTRASOUND (EBUS)
Anesthesia: General | Laterality: Bilateral

## 2017-10-27 MED ORDER — FENTANYL CITRATE (PF) 100 MCG/2ML IJ SOLN
INTRAMUSCULAR | Status: DC | PRN
  Administered 2017-10-27 (×2): 50 ug via INTRAVENOUS

## 2017-10-27 MED ORDER — PROPOFOL 10 MG/ML IV BOLUS
INTRAVENOUS | Status: DC | PRN
  Administered 2017-10-27: 200 mg via INTRAVENOUS

## 2017-10-27 MED ORDER — DEXAMETHASONE SODIUM PHOSPHATE 10 MG/ML IJ SOLN
INTRAMUSCULAR | Status: DC | PRN
  Administered 2017-10-27: 10 mg via INTRAVENOUS

## 2017-10-27 MED ORDER — LIDOCAINE 2% (20 MG/ML) 5 ML SYRINGE
INTRAMUSCULAR | Status: DC | PRN
  Administered 2017-10-27: 50 mg via INTRAVENOUS

## 2017-10-27 MED ORDER — PHENOL 1.4 % MT LIQD
1.0000 | OROMUCOSAL | Status: DC | PRN
  Filled 2017-10-27: qty 177

## 2017-10-27 MED ORDER — ROCURONIUM BROMIDE 50 MG/5ML IV SOSY
PREFILLED_SYRINGE | INTRAVENOUS | Status: DC | PRN
  Administered 2017-10-27: 10 mg via INTRAVENOUS
  Administered 2017-10-27: 30 mg via INTRAVENOUS

## 2017-10-27 MED ORDER — LIDOCAINE HCL 4 % MT SOLN
OROMUCOSAL | Status: DC | PRN
  Administered 2017-10-27: 4 mL via TOPICAL

## 2017-10-27 MED ORDER — EPHEDRINE SULFATE 50 MG/ML IJ SOLN
INTRAMUSCULAR | Status: DC | PRN
  Administered 2017-10-27: 5 mg via INTRAVENOUS
  Administered 2017-10-27: 10 mg via INTRAVENOUS
  Administered 2017-10-27: 5 mg via INTRAVENOUS

## 2017-10-27 MED ORDER — SUGAMMADEX SODIUM 200 MG/2ML IV SOLN
INTRAVENOUS | Status: DC | PRN
  Administered 2017-10-27: 150 mg via INTRAVENOUS

## 2017-10-27 MED ORDER — FENTANYL CITRATE (PF) 100 MCG/2ML IJ SOLN
INTRAMUSCULAR | Status: AC
  Filled 2017-10-27: qty 2

## 2017-10-27 MED ORDER — PROPOFOL 10 MG/ML IV BOLUS
INTRAVENOUS | Status: AC
  Filled 2017-10-27: qty 20

## 2017-10-27 MED ORDER — SODIUM CHLORIDE 0.9 % IV SOLN
INTRAVENOUS | Status: DC | PRN
  Administered 2017-10-27 (×2): via INTRAVENOUS

## 2017-10-27 MED ORDER — ONDANSETRON HCL 4 MG/2ML IJ SOLN
INTRAMUSCULAR | Status: DC | PRN
  Administered 2017-10-27: 4 mg via INTRAVENOUS

## 2017-10-27 MED ORDER — MIDAZOLAM HCL 5 MG/5ML IJ SOLN
INTRAMUSCULAR | Status: DC | PRN
  Administered 2017-10-27: 2 mg via INTRAVENOUS

## 2017-10-27 NOTE — Anesthesia Preprocedure Evaluation (Addendum)
Anesthesia Evaluation  Patient identified by MRN, date of birth, ID band Patient awake    Reviewed: Allergy & Precautions, NPO status , Patient's Chart, lab work & pertinent test results  Airway Mallampati: II  TM Distance: >3 FB Neck ROM: Full    Dental no notable dental hx.    Pulmonary former smoker,    Pulmonary exam normal breath sounds clear to auscultation       Cardiovascular negative cardio ROS Normal cardiovascular exam Rhythm:Regular Rate:Normal     Neuro/Psych  Headaches, PSYCHIATRIC DISORDERS Anxiety Depression    GI/Hepatic negative GI ROS, (+) Hepatitis -, Autoimmune  Endo/Other  negative endocrine ROS  Renal/GU negative Renal ROS     Musculoskeletal  (+) Arthritis , Osteoarthritis and Rheumatoid disorders,  Fibromyalgia -Lupus (systemic lupus erythematosus) Sjoegren syndrome Raynaud disease   Abdominal   Peds  Hematology negative hematology ROS (+) anemia ,   Anesthesia Other Findings lung mass Port a cath  Reproductive/Obstetrics                            Anesthesia Physical Anesthesia Plan  ASA: II  Anesthesia Plan: General   Post-op Pain Management:    Induction: Intravenous  PONV Risk Score and Plan: 3 and Dexamethasone, Ondansetron, Midazolam and Treatment may vary due to age or medical condition  Airway Management Planned: Oral ETT  Additional Equipment:   Intra-op Plan:   Post-operative Plan: Extubation in OR  Informed Consent: I have reviewed the patients History and Physical, chart, labs and discussed the procedure including the risks, benefits and alternatives for the proposed anesthesia with the patient or authorized representative who has indicated his/her understanding and acceptance.   Dental advisory given  Plan Discussed with: CRNA  Anesthesia Plan Comments:         Anesthesia Quick Evaluation

## 2017-10-27 NOTE — Progress Notes (Signed)
Video bronchoscopy performed along with EBUS procedure Intervention bronchial brushings Intervention bronchial biopsies Pt tolerated well  Kathie Dike RRT

## 2017-10-27 NOTE — Plan of Care (Signed)
  Elimination: Will not experience complications related to bowel motility 10/27/2017 0807 - Progressing by Dorene Sorrow, RN   Nutrition: Adequate nutrition will be maintained 10/27/2017 0807 - Progressing by Dorene Sorrow, RN   Pain Managment: General experience of comfort will improve 10/27/2017 0807 - Progressing by Dorene Sorrow, RN

## 2017-10-27 NOTE — Anesthesia Postprocedure Evaluation (Signed)
Anesthesia Post Note  Patient: Diana Schwartz  Procedure(s) Performed: ENDOBRONCHIAL ULTRASOUND (Bilateral )     Patient location during evaluation: PACU Anesthesia Type: General Level of consciousness: awake and alert Pain management: pain level controlled Vital Signs Assessment: post-procedure vital signs reviewed and stable Respiratory status: spontaneous breathing, nonlabored ventilation, respiratory function stable and patient connected to nasal cannula oxygen Cardiovascular status: blood pressure returned to baseline and stable Postop Assessment: no apparent nausea or vomiting Anesthetic complications: no    Last Vitals:  Vitals:   10/27/17 1250 10/27/17 1321  BP: 136/70 118/75  Pulse: (!) 55 (!) 56  Resp: 14   Temp:  36.5 C  SpO2: 95% 100%    Last Pain:  Vitals:   10/27/17 1321  TempSrc: Oral  PainSc:                  Ryan P Ellender

## 2017-10-27 NOTE — Anesthesia Procedure Notes (Addendum)
Procedure Name: Intubation Date/Time: 10/27/2017 10:49 AM Performed by: West Pugh, CRNA Pre-anesthesia Checklist: Patient identified, Emergency Drugs available, Suction available, Patient being monitored and Timeout performed Patient Re-evaluated:Patient Re-evaluated prior to induction Oxygen Delivery Method: Circle system utilized Preoxygenation: Pre-oxygenation with 100% oxygen Induction Type: IV induction Ventilation: Mask ventilation without difficulty Laryngoscope Size: 3 and Glidescope Grade View: Grade I Tube type: Oral Tube size: 8.5 mm Number of attempts: 1 Airway Equipment and Method: Stylet,  Video-laryngoscopy and LTA kit utilized Placement Confirmation: ETT inserted through vocal cords under direct vision,  positive ETCO2,  CO2 detector and breath sounds checked- equal and bilateral Secured at: 21 cm Tube secured with: Tape Dental Injury: Teeth and Oropharynx as per pre-operative assessment  Comments: Elective glidescope use. Patient has had ACDF. Head, neck and spine maintained in neutral alignment during laryngoscopy.

## 2017-10-27 NOTE — Progress Notes (Signed)
  Radiation Oncology         [phone] ________________________________  Name: LEITA LINDBLOOM MRN: [id number]  Date: 10/26/2017  DOB: 02-02-1966  Inpatient  SIMULATION AND TREATMENT PLANNING NOTE    ICD-10-CM   1. Brain metastases ([place]) C79.31     DIAGNOSIS:  52 yo woman with left upper lung cancer and 22 brain metastases  NARRATIVE:  The patient was brought to the [place].  Identity was confirmed.  All relevant records and images related to the planned course of therapy were reviewed.  The patient freely provided informed written consent to proceed with treatment after reviewing the details related to the planned course of therapy. The consent form was witnessed and verified by the simulation staff.  Then, the patient was set-up in a stable reproducible  supine position for radiation therapy.  CT images were obtained.  Surface markings were placed.  The CT images were loaded into the planning software.  Then the target and avoidance structures were contoured.  Treatment planning then occurred.  The radiation prescription was entered and confirmed.  Then, I designed and supervised the construction of a total of one medically necessary complex treatment device in the form of thermoplastic mask.  I have requested : Isodose Plan.  PLAN:  The patient will receive 30 Gy in 10 fractions to the whole brain and lung for palliation.  ________________________________  Sheral Apley Tammi Klippel, M.D.

## 2017-10-27 NOTE — Transfer of Care (Signed)
Immediate Anesthesia Transfer of Care Note  Patient: Diana Schwartz  Procedure(s) Performed: ENDOBRONCHIAL ULTRASOUND (Bilateral )  Patient Location: PACU  Anesthesia Type:General  Level of Consciousness: awake, alert , oriented and patient cooperative  Airway & Oxygen Therapy: Patient Spontanous Breathing and Patient connected to face mask oxygen  Post-op Assessment: Report given to RN and Post -op Vital signs reviewed and stable  Post vital signs: Reviewed and stable  Last Vitals:  Vitals:   10/27/17 0529 10/27/17 0945  BP: 132/77 (!) 168/82  Pulse: (!) 51 (!) 44  Resp: 16 13  Temp: 36.7 C 36.6 C  SpO2: 100% 98%    Last Pain:  Vitals:   10/27/17 0945  TempSrc: Oral  PainSc:       Patients Stated Pain Goal: 3 (09/32/67 1245)  Complications: No apparent anesthesia complications

## 2017-10-27 NOTE — Consult Note (Signed)
Name: Diana Schwartz MRN: [id number] DOB: [birth year]    ADMISSION DATE:  10/23/2017 CONSULTATION DATE:  10/25/17  REFERRING MD :  Dr. Alfredia Ferguson / [facility]   CHIEF COMPLAINT:  Abnormal CT Chest    HISTORY OF PRESENT ILLNESS:  52 y/o F admitted 3/2 with complaints of headache.   PMH - former smoker (quit 2013), lupus, autoimmune hepatitis, depression, anxiety, fibromyalgia, osteoarthritis, DJD, RA, Sjgren syndrome and Raynauds.   The patient was seen in Dr. Lew Dawes office on 3/2 with new MRI results with concern for metastatic brain disease.  She carries a history of invasive left lobular carcinoma (dx in 2013) s/p bilateral mastectomy with reconstruction.  She completed tamoxifen.  In September 2018 she developed cough with concern for bilateral PNA.  The patient completed multiple rounds of antibiotics without resolution of radiographic or clinical symptoms.  She had follow up XRAY's in 09/2017 which showed persistent infiltrate. A CT of the chest was completed 10/12/17 which demonstrated a left middle lobe heterogeneous, possibly necrotic, soft tissue mass measuring up to 8.4 cm which was concerning for malignancy.  In addition, she had bulky left hilar & mediastinal lymphadenopathy.  PET-CT completed 10/19/17 showed a hypermetabolic lingular mass with cervical / thoracic nodal, left adrenal and bone metastasis concerning for primary bronchogenic carcinoma.  Also noted on the PET scan was a tiny left pleural effusion, postobstructive pneumonitis and/or lymphangitic tumor spread, and hypoattenuation within the right frontal and temporal lobes which was suboptimally evaluated.  Follow up MRI 10/23/17 was positive for widespread metastatic disease to the brain, approximately 22 individual metastases are identified ranging from punctate to 16 mm diameter, multifocal associated vasogenic edema in the cerebral hemispheres, small C4 vertebral body metastasis, no skull metastasis identified.   PCCM consulted for  evaluation for possible lymph node biopsy.    PAST MEDICAL HISTORY :   has a past medical history of Anxiety, Autoimmune hepatitis ([place]), Breast cancer ([place]) (2013), Carpal tunnel syndrome of left wrist, Degenerative disc disease, lumbar, Depression, Fibromyalgia, Lupus (systemic lupus erythematosus) ([place]), Osteoarthritis, Rash, Raynaud disease, Rheumatoid arthritis ([place]), and Sjoegren syndrome ([place]).   has a past surgical history that includes Anterior cervical decomp/discectomy fusion (02/06/2004); Breast reconstruction (06/2013); Carpal tunnel release (Right, 1989); Axillary node dissection (Left, 08/2012); Vaginal hysterectomy (1999); Mastectomy (Bilateral, 08/12/2012); Carpal tunnel release (Left, 10/02/2013); Foot surgery (Right); thoracic back surgery (05/2015); IR US Guide Vasc Access Right (10/25/2017); and IR FLUORO GUIDE PORT INSERTION RIGHT (10/25/2017).  Prior to Admission medications   Medication Sig Start Date End Date Taking? Authorizing Provider  acetaminophen (TYLENOL) 500 MG tablet Take 1,000 mg by mouth every 6 (six) hours as needed.   Yes [provider]  ALPRAZolam Duanne Moron) 0.5 MG tablet Take 0.5 mg by mouth at bedtime as needed for anxiety.   Yes [provider]  azaTHIOprine (IMURAN) 50 MG tablet Take 50 mg by mouth 2 (two) times daily.   Yes [provider]  Cholecalciferol (HM VITAMIN D3) 4000 units CAPS Take by mouth.   Yes [provider]  hydroxychloroquine (PLAQUENIL) 200 MG tablet Take 200 mg by mouth 2 (two) times daily.   Yes [provider]  venlafaxine XR (EFFEXOR-XR) 150 MG 24 hr capsule Take 2 capsules by mouth daily with breakfast.  10/20/17  Yes [provider]    Allergies  Allergen Reactions  . Oxycodone Anaphylaxis, Hives and Swelling    Whelps and swelling in throat  . Doxycycline Nausea And Vomiting  . Prednisone Nausea And  Vomiting and Diarrhea    Can take IV steroids    FAMILY HISTORY:  family history  includes Hypertension in her father and mother; Lung cancer in her father and mother; Rheum arthritis in her father.  SOCIAL HISTORY:  reports that she quit smoking about 5 years ago. she has never used smokeless tobacco. She reports that she drinks alcohol. She reports that she does not use drugs.  REVIEW OF SYSTEMS:  POSITIVES IN BOLD Constitutional: Negative for fever, chills, weight loss, malaise/fatigue and diaphoresis.  HENT: Negative for hearing loss, ear pain, nosebleeds, congestion, sore throat, neck pain, tinnitus and ear discharge.   Eyes: Negative for blurred vision, double vision, photophobia, pain, discharge and redness.  Respiratory: Negative for cough, hemoptysis, sputum production, shortness of breath, wheezing and stridor.   Cardiovascular: Negative for chest pain, palpitations, orthopnea, claudication, leg swelling and PND.  Gastrointestinal: Negative for heartburn, nausea, vomiting, abdominal pain, diarrhea, constipation, blood in stool and melena.  Genitourinary: Negative for dysuria, urgency, frequency, hematuria and flank pain.  Musculoskeletal: Negative for myalgias, back pain, joint pain and falls.  Skin: Negative for itching and rash.  Neurological: Negative for dizziness, tingling, tremors, sensory change, speech change, focal weakness, seizures, loss of consciousness, weakness and headaches.  Endo/Heme/Allergies: Negative for environmental allergies and polydipsia. Does not bruise/bleed easily.   SUBJECTIVE:  Feels well.  No complains of cough, dyspnea.  VITAL SIGNS: Temp:  [97.9 F (36.6 C)-98.4 F (36.9 C)] 97.9 F (36.6 C) (03/06 0945) Pulse Rate:  [44-53] 44 (03/06 0945) Resp:  [13-18] 13 (03/06 0945) BP: (132-168)/(77-82) 168/82 (03/06 0945) SpO2:  [97 %-100 %] 98 % (03/06 0945) Weight:  [120 lb 14.4 oz (54.8 kg)] 120 lb 14.4 oz (54.8 kg) (03/06 0945)  PHYSICAL EXAMINATION: Gen:      No acute distress HEENT:  EOMI, sclera anicteric Neck:     No  masses; no thyromegaly Lungs:    Clear to auscultation bilaterally; normal respiratory effort CV:         Regular rate and rhythm; no murmurs Abd:      + bowel sounds; soft, non-tender; no palpable masses, no distension Ext:    No edema; adequate peripheral perfusion Skin:      Warm and dry; no rash Neuro: alert and oriented x 3 Psych: normal mood and affect  Recent Labs  Lab 10/25/17 0422 10/26/17 0804 10/27/17 0515  NA 141 138 139  K 4.0 4.2 4.2  CL 109 103 102  CO2 24 26 29   BUN 7 13 15   CREATININE 0.46 0.44 0.52  GLUCOSE 122* 109* 109*    Recent Labs  Lab 10/25/17 0422 10/26/17 0804 10/27/17 0515  HGB 11.8* 11.4* 11.3*  HCT 35.7* 33.8* 34.5*  WBC 12.6* 11.7* 10.2  PLT 397 397 382    SIGNIFICANT EVENTS  3/02  Admit with headache   STUDIES: CT scan 10/12/17-left lingula soft tissue mass with mediastinal adenopathy, 1.5 cm thyroid mass. PET scan 10/19/17-hypermetabolic lingular mass, mediastinal lymphadenopathy, adrenal lesion. I reviewed the images personally.  CULTURES   ANTIBIOTICS   ASSESSMENT / PLAN: Lung mass with mediastinal, brain lesions concerning for metastatic lung cancer Examined patient in preop area Consent obtained Stable to proceed with bronchoscope, EBUS biopsy  Marshell Garfinkel MD [facility] Pager [phone] If no answer or after 3pm call: [phone] 10/27/2017, 10:29 AM

## 2017-10-27 NOTE — Op Note (Signed)
[facility] Cardiopulmonary Patient Name: Diana Schwartz Procedure Date: 10/27/2017 MRN: [id number] Attending MD: Marshell Garfinkel , MD Date of Birth: 1966-02-04 CSN: [id number] Age: 52 Admit Type: Inpatient Ethnicity: Not Hispanic or Latino Procedure:            Bronchoscopy Indications:          Lung mass, Hilar lymphadenopathy of the left side,                        Mediastinal adenopathy, Paratracheal adenopathy Providers:            Marshell Garfinkel, MD, Burtis Junes, RN, Ashley Mariner RRT,RCP Referring MD:          Medicines:            General Anesthesia Complications:        No immediate complications Estimated Blood Loss: Estimated blood loss: none. Procedure:      Pre-Anesthesia Assessment:      - A History and Physical has been performed. Patient meds and allergies       have been reviewed. The risks and benefits of the procedure and the       sedation options and risks were discussed with the patient. All       questions were answered and informed consent was obtained. Patient       identification and proposed procedure were verified prior to the       procedure by the physician in the pre-procedure area. Mental Status       Examination: alert and oriented. Airway Examination: normal       oropharyngeal airway. Respiratory Examination: clear to auscultation. CV       Examination: normal. ASA Grade Assessment: II - A patient with mild       systemic disease. After reviewing the risks and benefits, the patient       was deemed in satisfactory condition to undergo the procedure. The       anesthesia plan was to use general anesthesia. Immediately prior to       administration of medications, the patient was re-assessed for adequacy       to receive sedatives. The heart rate, respiratory rate, oxygen       saturations, blood pressure, adequacy of pulmonary ventilation, and       response to care were monitored throughout the procedure. The physical       status  of the patient was re-assessed after the procedure.      After obtaining informed consent, the bronchoscope was passed under       direct vision. Throughout the procedure, the patient's blood pressure,       pulse, and oxygen saturations were monitored continuously. the LT-[id number]       ( [id number]) scope was introduced through the nose, via the endotracheal       tube (the patient was intubated for the procedure) and advanced to the       tracheobronchial tree of both lungs. Findings:      The inferior segment of the left lingla was partially occluded with       muscosal irregularity (see picture)      Endobronchial ultrasound was used assist with guiding the biopsy needle       in the right paratracheal area, in the subcarinal area and in the left       hilum. Nodes sampled were 2R, 4R, 7 and 11L  Transbronchial brushings of a mass were obtained in the inferior lingula       segment of the left upper lobe with a cytology brush. Four samples were       obtained.      Transbronchial biopsies of a mass were performed in the inferior lingula       segment of the left upper lobe using alligator forceps and sent for       histopathology examination. Biopsy of lung tissue was obtained. Five       biopsy passes were performed. Five biopsy samples were obtained. Impression:      - Hilar lymphadenopathy of the left side      - Mediastinal adenopathy      - Paratracheal adenopathy      - Endobronchial ultrasound and biopsy was performed on 2R, 4R, 7 and 11L      - Transbronchial brushings were obtained.      - Transbronchial lung biopsies were performed.      - Lung mass suspicious for cancer Moderate Sedation:      General anesthesia given Recommendation:      - Await biopsy, brushing and cytology results. Procedure Code(s):      --- Professional ---      [phone], Bronchoscopy, rigid or flexible, including fluoroscopic guidance,       when performed; with transbronchial lung biopsy(s),  single lobe      95284, Bronchoscopy, rigid or flexible, including fluoroscopic guidance,       when performed; with brushing or protected brushings      31654, Bronchoscopy, rigid or flexible, including fluoroscopic guidance,       when performed; with transendoscopic endobronchial ultrasound (EBUS)       during bronchoscopic diagnostic or therapeutic intervention(s) for       peripheral lesion(s) (List separately in addition to code for primary       procedure[s]) Diagnosis Code(s):      --- Professional ---      R91.8, Other nonspecific abnormal finding of lung field      R59.9, Enlarged lymph nodes, unspecified CPT copyright 2016 [employer]. All rights reserved. The codes documented in this report are preliminary and upon coder review may  be revised to meet current compliance requirements. Marshell Garfinkel, MD 10/27/2017 12:23:39 PM Number of Addenda: 0 Scope In: Scope Out:

## 2017-10-27 NOTE — Progress Notes (Addendum)
PROGRESS NOTE    TRU LEOPARD  OZH:[id number] DOB: 12/16/1965 DOA: 10/23/2017 PCP: [name], MD   Brief Narrative:  Patient is a 25 female with past medical history of left invasive lobular carcinoma of the breast diagnosed in 2013, status post bilateral mastectomy with reconstruction.  She was sent by her oncologist to the emergency department to admit her for IV steroids after she was found to have abnormal MRI indicating brain lesions with vasogenic edema,  Mild midline shift without evidence of impending herniation and hydrocephalus. Patient had reported several weeks history of fatigue with intermittent headaches and recently was also noted to have unstable gait for which her oncologist ordered an MRI. Patient was admitted for IV steroids.  Radiation oncology was consulted.  Patient also has lung mass.  Pulmonary consulted and she underwent biopsy by bronchoscopy on 10/27/17. Port-A-Cath has also been placed and patient also underwent whole brain/chest radiation simulation.  Assessment & Plan:   Active Problems:   Lupus   Autoimmune hepatitis ([place])   Breast cancer ([place])   Depression   Anxiety   Fibromyalgia   Lung mass   Brain edema ([facility])   Malnutrition of moderate degree   Brain metastasis with vasogenic edema, mild left midline shift: Most likely secondary to metastasis from the lung mass. Cancer type unknown at this point.  Underwent bronchoscopic biopsy on 10/27/17 for tissue sampling. MRI had shown widespread metastatic disease to the brain. Report:Approximately 22 individual metastases are identified ranging from punctate to 16 mm diameter. Multifocal associated vasogenic edema in the cerebral hemispheres, although there is only trace associated leftward midline shift and there is no ventriculomegaly or impending herniation. Small C4 vertebral body metastasis suspected. No skull metastasis identified. Continue dexamethasone IV.  Continue GI  prophylaxis Port-A-Cath was placed on 10/25/17.  Patient underwent whole brain and chest radiation simulation. Symptoms of headache has improved. We will follow-up the biopsy report. She will follow-up with oncology as an outpatient.  Lung mass: Unknown type.  We will follow-up the biopsy report.  Unsteady gait/generalized weakness: Much improved.  PT/OT eval to the patient and recommended no follow-up.  History of breast cancer: History of invasive lobar carcinoma.  Status post bilateral mastectomy with reconstruction and tamoxifen for 5 years .She will follow-up with oncology on discharge.  Depression/anxiety: We will continue her home meds, venlafaxine and alprazolam  History of lupus/rheumatoid arthritis/Sjogren's syndrome: On Azothioprine 50 mg po BID and Hydroxychloroquine 200 mg po BID  Autoimmune hepatitis/abnormal LFTs: Worsened today.  We will continue to monitor.  Ultrasound of the right upper quadrant did not show any abnormalities.  We will follow-up hepatitis panel.  Patient denies any abdominal pain  Microcytic anemia: Currently H&H is stable.    Moderate malnutrition: Most likely associated with malignancy.  Nutrition following.  Constipation: Continue current regimen.     DVT prophylaxis: SCD Code Status: Full Family Communication: Husband present on the bedside Disposition Plan: Home likely tomorrow   Consultants: Oncology, radiation oncology, IR, P CCM  Procedures: Port-A-Cath placement, bronchoscopy  Antimicrobials: None  Subjective:  Patient seen and examined the bedside this morning.  Remains comfortable.  Denies any complaints.  Denies any headache/dizziness.  Awaiting bronchoscopy today Objective: Vitals:   10/27/17 1220 10/27/17 1247 10/27/17 1250 10/27/17 1321  BP: 129/68 125/76 136/70 118/75  Pulse: 63 61 (!) 55 (!) 56  Resp: _0 Temp:    97.7 F (36.5 C)  TempSrc:    Oral  SpO2: 100%  95% 95% 100%  Weight:      Height:         Intake/Output Summary (Last 24 hours) at 10/27/2017 1629 Last data filed at 10/27/2017 1248 Gross per 24 hour  Intake 1752.5 ml  Output -  Net 1752.5 ml   Filed Weights   10/23/17 1900 10/27/17 0945  Weight: 54.8 kg (120 lb 14.4 oz) 54.8 kg (120 lb 14.4 oz)    Examination:  General exam: Appears calm and comfortable ,Not in distress,average built HEENT:PERRL,Oral mucosa moist, Ear/Nose normal on gross exam Respiratory system: Bilateral equal air entry, normal vesicular breath sounds, no wheezes or crackles  Cardiovascular system: S1 & S2 heard, RRR. No JVD, murmurs, rubs, gallops or clicks. No pedal edema. Gastrointestinal system: Abdomen is nondistended, soft and nontender. No organomegaly or masses felt. Normal bowel sounds heard. Central nervous system: Alert and oriented. No focal neurological deficits. Extremities: No edema, no clubbing ,no cyanosis, distal peripheral pulses palpable. Skin: No rashes, lesions or ulcers,no icterus ,no pallor MSK: Normal muscle bulk,tone ,power Psychiatry: Judgement and insight appear normal. Mood & affect appropriate.     Data Reviewed: I have personally reviewed following labs and imaging studies  CBC: Recent Labs  Lab 10/23/17 1502 10/24/17 0433 10/25/17 0422 10/26/17 0804 10/27/17 0515  WBC 5.5 5.3 12.6* 11.7* 10.2  NEUTROABS 3.3 4.7 11.6* 10.6* 8.8*  HGB 12.7 11.2* 11.8* 11.4* 11.3*  HCT 37.8 34.5* 35.7* 33.8* 34.5*  MCV 101.3* 103.0* 102.3* 101.8* 101.5*  PLT 370 380 397 397 732   Basic Metabolic Panel: Recent Labs  Lab 10/23/17 1502 10/24/17 0433 10/25/17 0422 10/26/17 0804 10/27/17 0515  NA 138 136 141 138 139  K 3.3* 4.4 4.0 4.2 4.2  CL 101 104 109 103 102  CO2 _0 GLUCOSE 88 128* 122* 109* 109*  BUN _1 CREATININE 0.70 0.52 0.46 0.44 0.52  CALCIUM 8.9 8.7* 8.8* 8.5* 8.5*  MG  --   --  2.0 1.9 2.0  PHOS  --   --  3.5 3.9 4.1   GFR: Estimated Creatinine Clearance: 65.1 mL/min (by C-G  formula based on SCr of 0.52 mg/dL). Liver Function Tests: Recent Labs  Lab 10/22/17 0747 10/24/17 0433 10/25/17 0422 10/26/17 0804 10/27/17 0515  AST 45* 63* 80* 245* 272*  ALT 26 38 37 71* 122*  ALKPHOS 158* 131* 124 102 103  BILITOT 0.5 0.2* 0.3 0.3 0.3  PROT 7.7 6.7 6.7 6.3* 6.1*  ALBUMIN 3.0* 2.6* 2.8* 2.7* 2.5*   No results for input(s): LIPASE, AMYLASE in the last 168 hours. No results for input(s): AMMONIA in the last 168 hours. Coagulation Profile: Recent Labs  Lab 10/27/17 0515  INR 1.03   Cardiac Enzymes: No results for input(s): CKTOTAL, CKMB, CKMBINDEX, TROPONINI in the last 168 hours. BNP (last 3 results) No results for input(s): PROBNP in the last 8760 hours. HbA1C: No results for input(s): HGBA1C in the last 72 hours. CBG: Recent Labs  Lab 10/26/17 1216 10/26/17 1656 10/26/17 2042 10/27/17 0725 10/27/17 1316  GLUCAP 120* 131* 114* 101* 117*   Lipid Profile: No results for input(s): CHOL, HDL, LDLCALC, TRIG, CHOLHDL, LDLDIRECT in the last 72 hours. Thyroid Function Tests: No results for input(s): TSH, T4TOTAL, FREET4, T3FREE, THYROIDAB in the last 72 hours. Anemia Panel: No results for input(s): VITAMINB12, FOLATE, FERRITIN, TIBC, IRON, RETICCTPCT in the last 72 hours. Sepsis Labs: No results for input(s): PROCALCITON, LATICACIDVEN in the last 168 hours.  No results found for this or any previous visit (from the past 240 hour(s)).       Radiology Studies: Ir US Guide Vasc Access Right  Result Date: 10/25/2017 INDICATION: History of recurrent metastatic breast cancer. In need of durable intravenous access for chemotherapy administration. EXAM: IMPLANTED PORT A CATH PLACEMENT WITH ULTRASOUND AND FLUOROSCOPIC GUIDANCE COMPARISON:  PET-CT - 10/19/2017 MEDICATIONS: Ancef 2 gm IV; The antibiotic was administered within an appropriate time interval prior to skin puncture. ANESTHESIA/SEDATION: Moderate (conscious) sedation was employed during this  procedure. A total of Versed 2 mg and Fentanyl 100 mcg was administered intravenously. Moderate Sedation Time: 24 minutes. The patient's level of consciousness and vital signs were monitored continuously by radiology nursing throughout the procedure under my direct supervision. CONTRAST:  None FLUOROSCOPY TIME:  18 seconds (3 mGy) COMPLICATIONS: None immediate. PROCEDURE: The procedure, risks, benefits, and alternatives were explained to the patient. Questions regarding the procedure were encouraged and answered. The patient understands and consents to the procedure. The right neck and chest were prepped with chlorhexidine in a sterile fashion, and a sterile drape was applied covering the operative field. Maximum barrier sterile technique with sterile gowns and gloves were used for the procedure. A timeout was performed prior to the initiation of the procedure. Local anesthesia was provided with 1% lidocaine with epinephrine. After creating a small venotomy incision, a micropuncture kit was utilized to access the internal jugular vein. Real-time ultrasound guidance was utilized for vascular access including the acquisition of a permanent ultrasound image documenting patency of the accessed vessel. The microwire was utilized to measure appropriate catheter length. A subcutaneous port pocket was then created along the upper chest wall utilizing a combination of sharp and blunt dissection. The pocket was irrigated with sterile saline. A single lumen thin power injectable port was chosen for placement. The 8 Fr catheter was tunneled from the port pocket site to the venotomy incision. The port was placed in the pocket. The external catheter was trimmed to appropriate length. At the venotomy, an 8 Fr peel-away sheath was placed over a guidewire under fluoroscopic guidance. The catheter was then placed through the sheath and the sheath was removed. Final catheter positioning was confirmed and documented with a fluoroscopic  spot radiograph. The port was accessed with a Huber needle, aspirated and flushed. The venotomy site was closed with an interrupted 4-0 Vicryl suture. The port pocket incision was closed with interrupted 2-0 Vicryl suture and the skin was opposed with a running subcuticular 4-0 Vicryl suture. Dermabond and Steri-strips were applied to both incisions. Port a Catheter was left accessed for inpatient hospitalization. Dressings were placed. The patient tolerated the procedure well without immediate post procedural complication. FINDINGS: After catheter placement, the tip lies within the superior cavoatrial junction. The catheter aspirates and flushes normally and is ready for immediate use. IMPRESSION: Successful placement of a right internal jugular approach power injectable Port-A-Cath. Port a Catheter was left accessed and is ready for immediate use. Electronically Signed   By: Sandi Mariscal M.D.   On: 10/25/2017 17:30   Dg Chest Port 1 View  Result Date: 10/27/2017 CLINICAL DATA:  Status post bronchoscopy EXAM: PORTABLE CHEST 1 VIEW COMPARISON:  10/06/2017 FINDINGS: Persistent lingular airspace opacity. Interval biopsy. No pleural effusion or pneumothorax. Right lung is clear. Stable cardiomediastinal silhouette. Right-sided Port-A-Cath in satisfactory position. No acute osseous abnormality. Surgical clips in the left axilla again noted. IMPRESSION: 1. Persistent lingular airspace opacity with interval biopsy. No pneumothorax. Electronically Signed  By: Kathreen Devoid   On: 10/27/2017 12:43   Ir Cyndy Freeze Guide Port Insertion Right  Result Date: 10/25/2017 INDICATION: History of recurrent metastatic breast cancer. In need of durable intravenous access for chemotherapy administration. EXAM: IMPLANTED PORT A CATH PLACEMENT WITH ULTRASOUND AND FLUOROSCOPIC GUIDANCE COMPARISON:  PET-CT - 10/19/2017 MEDICATIONS: Ancef 2 gm IV; The antibiotic was administered within an appropriate time interval prior to skin puncture.  ANESTHESIA/SEDATION: Moderate (conscious) sedation was employed during this procedure. A total of Versed 2 mg and Fentanyl 100 mcg was administered intravenously. Moderate Sedation Time: 24 minutes. The patient's level of consciousness and vital signs were monitored continuously by radiology nursing throughout the procedure under my direct supervision. CONTRAST:  None FLUOROSCOPY TIME:  18 seconds (3 mGy) COMPLICATIONS: None immediate. PROCEDURE: The procedure, risks, benefits, and alternatives were explained to the patient. Questions regarding the procedure were encouraged and answered. The patient understands and consents to the procedure. The right neck and chest were prepped with chlorhexidine in a sterile fashion, and a sterile drape was applied covering the operative field. Maximum barrier sterile technique with sterile gowns and gloves were used for the procedure. A timeout was performed prior to the initiation of the procedure. Local anesthesia was provided with 1% lidocaine with epinephrine. After creating a small venotomy incision, a micropuncture kit was utilized to access the internal jugular vein. Real-time ultrasound guidance was utilized for vascular access including the acquisition of a permanent ultrasound image documenting patency of the accessed vessel. The microwire was utilized to measure appropriate catheter length. A subcutaneous port pocket was then created along the upper chest wall utilizing a combination of sharp and blunt dissection. The pocket was irrigated with sterile saline. A single lumen thin power injectable port was chosen for placement. The 8 Fr catheter was tunneled from the port pocket site to the venotomy incision. The port was placed in the pocket. The external catheter was trimmed to appropriate length. At the venotomy, an 8 Fr peel-away sheath was placed over a guidewire under fluoroscopic guidance. The catheter was then placed through the sheath and the sheath was removed.  Final catheter positioning was confirmed and documented with a fluoroscopic spot radiograph. The port was accessed with a Huber needle, aspirated and flushed. The venotomy site was closed with an interrupted 4-0 Vicryl suture. The port pocket incision was closed with interrupted 2-0 Vicryl suture and the skin was opposed with a running subcuticular 4-0 Vicryl suture. Dermabond and Steri-strips were applied to both incisions. Port a Catheter was left accessed for inpatient hospitalization. Dressings were placed. The patient tolerated the procedure well without immediate post procedural complication. FINDINGS: After catheter placement, the tip lies within the superior cavoatrial junction. The catheter aspirates and flushes normally and is ready for immediate use. IMPRESSION: Successful placement of a right internal jugular approach power injectable Port-A-Cath. Port a Catheter was left accessed and is ready for immediate use. Electronically Signed   By: Sandi Mariscal M.D.   On: 10/25/2017 17:30   US Abdomen Limited Ruq  Result Date: 10/27/2017 CLINICAL DATA:  Elevated liver function tests. Breast cancer patient. EXAM: ULTRASOUND ABDOMEN LIMITED RIGHT UPPER QUADRANT COMPARISON:  PET scan 10/19/2017 FINDINGS: Gallbladder: No gallstones or wall thickening visualized. No sonographic Murphy sign noted by sonographer. Common bile duct: Diameter: 2.7 mm common normal Liver: No focal lesion identified. Within normal limits in parenchymal echogenicity. Portal vein is patent on color Doppler imaging with normal direction of blood flow towards the liver. IMPRESSION: Normal right  upper quadrant ultrasound. No abnormality seen to explain abnormal lab results. Electronically Signed   By: Nelson Chimes M.D.   On: 10/27/2017 10:02        Scheduled Meds: . azaTHIOprine  50 mg Oral BID  . cholecalciferol  1,000 Units Oral Daily  . dexamethasone  6 mg Intravenous Q6H  . feeding supplement (ENSURE ENLIVE)  237 mL Oral Q24H  .  hydroxychloroquine  200 mg Oral BID  . pantoprazole  40 mg Oral BID  . polyethylene glycol  17 g Oral BID  . protein supplement shake  11 oz Oral Q24H  . senna-docusate  1 tablet Oral BID  . venlafaxine XR  300 mg Oral Q breakfast   Continuous Infusions:   LOS: 4 days    Time spent: 25 mins.More than 50% of that time was spent in counseling and/or coordination of care.      Marene Lenz, MD [facility] Pager [phone]  If 7PM-7AM, please contact night-coverage [email or web address] Password [facility] 10/27/2017, 4:29 PM

## 2017-10-27 NOTE — Progress Notes (Signed)
Pt having procedure.Will check back later in day or next day. [place], [place]

## 2017-10-28 ENCOUNTER — Ambulatory Visit
Admit: 2017-10-28 | Discharge: 2017-10-28 | Disposition: A | Discharge status: Home / Self Care | Payer: 59 | Attending: Radiation Oncology | Admitting: Radiation Oncology

## 2017-10-28 ENCOUNTER — Ambulatory Visit
Admission: RE | Admit: 2017-10-28 | Discharge: 2017-10-28 | Disposition: A | Discharge status: Home / Self Care | Payer: 59 | Source: Ambulatory Visit | Attending: Radiation Oncology | Admitting: Radiation Oncology

## 2017-10-28 ENCOUNTER — Ambulatory Visit: Payer: 59

## 2017-10-28 ENCOUNTER — Encounter (HOSPITAL_COMMUNITY): Payer: Self-pay | Admitting: Pulmonary Disease

## 2017-10-28 LAB — BASIC METABOLIC PANEL
Anion gap: 8 (ref 5–15)
BUN: 26 mg/dL — AB (ref 6–20)
CALCIUM: 9.6 mg/dL (ref 8.9–10.3)
CO2: 29 mmol/L (ref 22–32)
Chloride: 98 mmol/L — ABNORMAL LOW (ref 101–111)
Creatinine, Ser: 0.59 mg/dL (ref 0.44–1.00)
GFR calc Af Amer: >60 mL/min (ref >60)
Glucose, Bld: 102 mg/dL — ABNORMAL HIGH (ref 65–99)
Potassium: 5.1 mmol/L (ref 3.5–5.1)
Sodium: 135 mmol/L (ref 135–145)

## 2017-10-28 LAB — HEPATITIS PANEL, ACUTE
HEP A IGM: NEGATIVE
HEP B C IGM: NEGATIVE
HEP B S AG: NEGATIVE

## 2017-10-28 LAB — MAGNESIUM: MAGNESIUM: 2.2 mg/dL (ref 1.7–2.4)

## 2017-10-28 LAB — COMPREHENSIVE METABOLIC PANEL
ALK PHOS: 51 U/L (ref 38–126)
ALT: 80 U/L — AB (ref 14–54)
ANION GAP: 3 — AB (ref 5–15)
AST: 105 U/L — ABNORMAL HIGH (ref 15–41)
Albumin: 1.4 g/dL — ABNORMAL LOW (ref 3.5–5.0)
BILIRUBIN TOTAL: 0.3 mg/dL (ref 0.3–1.2)
BUN: 9 mg/dL (ref 6–20)
CALCIUM: 4.5 mg/dL — AB (ref 8.9–10.3)
CO2: 18 mmol/L — ABNORMAL LOW (ref 22–32)
Chloride: 121 mmol/L — ABNORMAL HIGH (ref 101–111)
Glucose, Bld: 69 mg/dL (ref 65–99)
Potassium: 2.2 mmol/L — CL (ref 3.5–5.1)
Sodium: 142 mmol/L (ref 135–145)
TOTAL PROTEIN: 3.1 g/dL — AB (ref 6.5–8.1)

## 2017-10-28 LAB — GLUCOSE, CAPILLARY
GLUCOSE-CAPILLARY: 92 mg/dL (ref 65–99)
Glucose-Capillary: 107 mg/dL — ABNORMAL HIGH (ref 65–99)

## 2017-10-28 MED ORDER — POTASSIUM CHLORIDE CRYS ER 20 MEQ PO TBCR
40.0000 meq | EXTENDED_RELEASE_TABLET | Freq: Once | ORAL | Status: AC
  Administered 2017-10-28: 40 meq via ORAL
  Filled 2017-10-28: qty 2

## 2017-10-28 MED ORDER — SODIUM CHLORIDE 0.9 % IV SOLN
2.0000 g | Freq: Once | INTRAVENOUS | Status: AC
  Administered 2017-10-28: 2 g via INTRAVENOUS
  Filled 2017-10-28: qty 20

## 2017-10-28 MED ORDER — DEXAMETHASONE 4 MG PO TABS
4.0000 mg | ORAL_TABLET | Freq: Four times a day (QID) | ORAL | Status: DC
  Filled 2017-10-28: qty 1

## 2017-10-28 MED ORDER — DEXAMETHASONE 4 MG PO TABS: 4.0000 mg | ORAL_TABLET | Freq: Four times a day (QID) | ORAL | 0 refills | Status: AC

## 2017-10-28 MED ORDER — PANTOPRAZOLE SODIUM 40 MG PO TBEC: 40.0000 mg | DELAYED_RELEASE_TABLET | Freq: Every day | ORAL | 0 refills | Status: DC

## 2017-10-28 MED ORDER — POTASSIUM CHLORIDE 10 MEQ/100ML IV SOLN
10.0000 meq | INTRAVENOUS | Status: AC
  Administered 2017-10-28 (×5): 10 meq via INTRAVENOUS
  Filled 2017-10-28 (×4): qty 100

## 2017-10-28 MED ORDER — HEPARIN SOD (PORK) LOCK FLUSH 100 UNIT/ML IV SOLN
500.0000 [IU] | INTRAVENOUS | Status: AC | PRN
  Administered 2017-10-28: 500 [IU]

## 2017-10-28 NOTE — Progress Notes (Addendum)
Name: Diana Schwartz MRN: [id number] DOB: 01-01-1966    ADMISSION DATE:  10/23/2017 CONSULTATION DATE:  10/25/17  REFERRING MD :  Dr. Alfredia Ferguson / [facility]   CHIEF COMPLAINT:  Abnormal CT Chest    HISTORY OF PRESENT ILLNESS:  52 y/o F admitted 3/2 with complaints of headache.   PMH - former smoker (quit 2013), lupus, autoimmune hepatitis, depression, anxiety, fibromyalgia, osteoarthritis, DJD, RA, Sjgren syndrome and Raynauds.   The patient was seen in Dr. Lew Dawes office on 3/2 with new MRI results with concern for metastatic brain disease.  She carries a history of invasive left lobular carcinoma (dx in 2013) s/p bilateral mastectomy with reconstruction.  She completed tamoxifen.  In September 2018 she developed cough with concern for bilateral PNA.  The patient completed multiple rounds of antibiotics without resolution of radiographic or clinical symptoms.  She had follow up XRAY's in 09/2017 which showed persistent infiltrate. A CT of the chest was completed 10/12/17 which demonstrated a left middle lobe heterogeneous, possibly necrotic, soft tissue mass measuring up to 8.4 cm which was concerning for malignancy.  In addition, she had bulky left hilar & mediastinal lymphadenopathy.  PET-CT completed 10/19/17 showed a hypermetabolic lingular mass with cervical / thoracic nodal, left adrenal and bone metastasis concerning for primary bronchogenic carcinoma.  Also noted on the PET scan was a tiny left pleural effusion, postobstructive pneumonitis and/or lymphangitic tumor spread, and hypoattenuation within the right frontal and temporal lobes which was suboptimally evaluated.  Follow up MRI 10/23/17 was positive for widespread metastatic disease to the brain, approximately 22 individual metastases are identified ranging from punctate to 16 mm diameter, multifocal associated vasogenic edema in the cerebral hemispheres, small C4 vertebral body metastasis, no skull metastasis identified.   PCCM consulted for  evaluation for possible lymph node biopsy.    PAST MEDICAL HISTORY :   has a past medical history of Anxiety, Autoimmune hepatitis ([place]), Breast cancer ([place]) (2013), Carpal tunnel syndrome of left wrist, Degenerative disc disease, lumbar, Depression, Fibromyalgia, Lupus (systemic lupus erythematosus) ([place]), Osteoarthritis, Rash, Raynaud disease, Rheumatoid arthritis ([place]), and Sjoegren syndrome ([place]).   has a past surgical history that includes Anterior cervical decomp/discectomy fusion (02/06/2004); Breast reconstruction (06/2013); Carpal tunnel release (Right, 1989); Axillary node dissection (Left, 08/2012); Vaginal hysterectomy (1999); Mastectomy (Bilateral, 08/12/2012); Carpal tunnel release (Left, 10/02/2013); Foot surgery (Right); thoracic back surgery (05/2015); IR US Guide Vasc Access Right (10/25/2017); and IR FLUORO GUIDE PORT INSERTION RIGHT (10/25/2017).  Prior to Admission medications   Medication Sig Start Date End Date Taking? Authorizing Provider  acetaminophen (TYLENOL) 500 MG tablet Take 1,000 mg by mouth every 6 (six) hours as needed.   Yes [provider]  ALPRAZolam Duanne Moron) 0.5 MG tablet Take 0.5 mg by mouth at bedtime as needed for anxiety.   Yes [provider]  azaTHIOprine (IMURAN) 50 MG tablet Take 50 mg by mouth 2 (two) times daily.   Yes [provider]  Cholecalciferol (HM VITAMIN D3) 4000 units CAPS Take by mouth.   Yes [provider]  hydroxychloroquine (PLAQUENIL) 200 MG tablet Take 200 mg by mouth 2 (two) times daily.   Yes [provider]  venlafaxine XR (EFFEXOR-XR) 150 MG 24 hr capsule Take 2 capsules by mouth daily with breakfast.  10/20/17  Yes [provider]    Allergies  Allergen Reactions  . Oxycodone Anaphylaxis, Hives and Swelling    Whelps and swelling in throat  . Doxycycline Nausea And Vomiting  . Prednisone Nausea And  Vomiting and Diarrhea    Can take IV steroids    FAMILY HISTORY:  family history  includes Hypertension in her father and mother; Lung cancer in her father and mother; Rheum arthritis in her father.  SOCIAL HISTORY:  reports that she quit smoking about 5 years ago. she has never used smokeless tobacco. She reports that she drinks alcohol. She reports that she does not use drugs.  REVIEW OF SYSTEMS:  POSITIVES IN BOLD Constitutional: Negative for fever, chills, weight loss, malaise/fatigue and diaphoresis.  HENT: Negative for hearing loss, ear pain, nosebleeds, congestion, sore throat, neck pain, tinnitus and ear discharge.   Eyes: Negative for blurred vision, double vision, photophobia, pain, discharge and redness.  Respiratory: Negative for cough, hemoptysis, sputum production, shortness of breath, wheezing and stridor.   Cardiovascular: Negative for chest pain, palpitations, orthopnea, claudication, leg swelling and PND.  Gastrointestinal: Negative for heartburn, nausea, vomiting, abdominal pain, diarrhea, constipation, blood in stool and melena.  Genitourinary: Negative for dysuria, urgency, frequency, hematuria and flank pain.  Musculoskeletal: Negative for myalgias, back pain, joint pain and falls.  Skin: Negative for itching and rash.  Neurological: Negative for dizziness, tingling, tremors, sensory change, speech change, focal weakness, seizures, loss of consciousness, weakness and headaches.  Endo/Heme/Allergies: Negative for environmental allergies and polydipsia. Does not bruise/bleed easily.   SUBJECTIVE:  Stable post bronchoscopy.  Denies any cough, sputum production, dyspnea, hemoptysis Sitting up in bed and eating today. She had dose of radiation to the brain and lung.  VITAL SIGNS: Temp:  [98.2 F (36.8 C)] 98.2 F (36.8 C) (03/07 0423) Pulse Rate:  [56-59] 59 (03/07 0423) Resp:  [16-18] 18 (03/07 0423) BP: (108-153)/(80-97) 108/80 (03/07 0423) SpO2:  [96 %-99 %] 99 % (03/07 0423)  PHYSICAL EXAMINATION: Blood pressure 108/80, pulse (!) 59,  temperature 98.2 F (36.8 C), temperature source Oral, resp. rate 18, height 5\' 2"  (1.575 m), weight 120 lb 14.4 oz (54.8 kg), SpO2 99 %. Gen:      No acute distress HEENT:  EOMI, sclera anicteric Neck:     No masses; no thyromegaly Lungs:    Clear to auscultation bilaterally; normal respiratory effort CV:         Regular rate and rhythm; no murmurs Abd:      + bowel sounds; soft, non-tender; no palpable masses, no distension Ext:    No edema; adequate peripheral perfusion Skin:      Warm and dry; no rash Neuro: alert and oriented x 3 Psych: normal mood and affect  Recent Labs  Lab 10/26/17 0804 10/27/17 0515 10/28/17 0423  NA 138 139 142  K 4.2 4.2 2.2*  CL 103 102 121*  CO2 26 29 18*  BUN 13 15 9   CREATININE 0.44 0.52 <0.30*  GLUCOSE 109* 109* 69    Recent Labs  Lab 10/25/17 0422 10/26/17 0804 10/27/17 0515  HGB 11.8* 11.4* 11.3*  HCT 35.7* 33.8* 34.5*  WBC 12.6* 11.7* 10.2  PLT 397 397 382    SIGNIFICANT EVENTS  3/02  Admit with headache   STUDIES: CT scan 10/12/17-left lingula soft tissue mass with mediastinal adenopathy, 1.5 cm thyroid mass. PET scan 10/19/17-hypermetabolic lingular mass, mediastinal lymphadenopathy, adrenal lesion. Chest x-ray 10/27/17- persistent lingular opacity.  No pneumothorax post bronchoscopy. I reviewed the images personally.  CULTURES   ANTIBIOTICS   ASSESSMENT / PLAN: Lung mass with mediastinal, brain lesions concerning for metastatic lung cancer Underwent bronchoscope, EBUS biopsy yesterday She tolerated it well with no complaints She is due  to be discharged today I will review the final pathology and give her a call when available Follow up with oncology  Marshell Garfinkel MD [facility] Pager [phone] If no answer or after 3pm call: [phone] 10/28/2017, 3:47 PM

## 2017-10-28 NOTE — Progress Notes (Signed)
Occupational Therapy Treatment/ [facility] Patient Details Name: Diana Schwartz MRN: [id number] DOB: December 04, 1965 Today's Date: 10/28/2017    History of present illness 52 y.o. female with medical history significant for but not limited to left invasive lobular carcinoma diagnosed in 2013 status post bilateral mastectomy with reconstruction was seen by oncologist to the ED to admit her for IV steroids on account of recent MRI abnormality indicating brain lesions with echogenic edema and mild midline shift    OT comments    Follow Up Recommendations  No OT follow up    Equipment Recommendations  None recommended by OT    Recommendations for Other Services      Precautions / Restrictions Precautions Precautions: None Restrictions Weight Bearing Restrictions: No       Mobility Bed Mobility Overal bed mobility: Independent                Transfers Overall transfer level: Independent                    Balance Overall balance assessment: Modified Independent                                         ADL either performed or assessed with clinical judgement   ADL Overall ADL's : Independent                                       General ADL Comments: Pt feels vision issues have resolved. Pt did state she is still havin headaches but does not feel of balance.  Educated in energy conservation strategies      Vision Baseline Vision/History: No visual deficits     Perception     Praxis      Cognition Arousal/Alertness: Awake/alert Behavior During Therapy: WFL for tasks assessed/performed Overall Cognitive Status: Within Functional Limits for tasks assessed                                                     Pertinent Vitals/ Pain       Pain Assessment: No/denies pain     Prior Functioning/Environment              Frequency  Min 2X/week        Progress Toward Goals  OT Goals(current  goals can now be found in the care plan section)  Progress towards OT goals: Goals met/education completed, patient discharged from [facility] All goals met and education completed, patient discharged from OT services    Co-evaluation                 AM-PAC PT "6 Clicks" Daily Activity     Outcome Measure   Help from another person eating meals?: None Help from another person taking care of personal grooming?: None Help from another person toileting, which includes using toliet, bedpan, or urinal?: None Help from another person bathing (including washing, rinsing, drying)?: None Help from another person to put on and taking off regular upper body clothing?: None Help from another person to put on and taking off regular lower body clothing?: None 6 Click Score: 24  End of Session    OT Visit Diagnosis: Muscle weakness (generalized) (M62.81)   Activity Tolerance Patient tolerated treatment well   Patient Left with call bell/phone within reach;with family/visitor present;in bed   Nurse Communication Mobility status        Time: 1026-1036 OT Time Calculation (min): 10 min  Charges: OT General Charges $OT Visit: 1 Visit OT Treatments $Self Care/Home Management : 8-22 mins  Lori , OT [phone]   ,  D 10/28/2017, 11:37 AM    

## 2017-10-28 NOTE — Progress Notes (Signed)
CRITICAL VALUE ALERT  Critical Value: Potassium 2.2 and Calcium 4.5  Date & Time Notied:  10/28/17  0535  Provider Notified: Lamar Blinks  Orders Received/Actions taken: New med orders given and acknowledged.

## 2017-10-28 NOTE — Progress Notes (Signed)
Physician Discharge Summary  Diana Schwartz HGD:[id number] DOB: Apr 05, 1966 DOA: 10/23/2017  PCP: [name], MD  Admit date: 10/23/2017 Discharge date: 10/28/2017  Admitted From: Home Disposition: Home  Discharge Condition: Stable CODE STATUS:Full Diet recommendation: Regular  Brief/Interim Summary:  Patient is a 52 female with past medical history of left invasive lobular carcinoma of the breast diagnosed in 2013, status post bilateral mastectomy with reconstruction.  She was sent by her oncologist to the emergency department to admit her for IV steroids after she was found to have abnormal MRI indicating brain lesions with vasogenic edema,  mild midline shift without evidence of impending herniation and hydrocephalus. Patient had reported several weeks history of fatigue with intermittent headaches and recently was also noted to have unstable gait for which her oncologist ordered an MRI. Patient was admitted for IV steroids.  Radiation oncology was consulted.  Patient also has lung mass.  Pulmonary consulted and she underwent biopsy by bronchoscopy on 10/27/17. Port-A-Cath has also been placed and patient also underwent whole brain/chest radiation simulation. Patient's overall condition has remained stable.  She does not complain of any gait problems at present .  She was seen by the physical therapy and recommended she does not need any follow-up. She is stable to be discharged home today.  She will follow-up with radiation oncology and oncology as an outpatient.  Following problems were addressed during her hospitalization:   Brain metastasis with vasogenic edema, mild left midline shift: Most likely secondary to metastasis from the lung mass. Cancer type unknown at this point.  Underwent bronchoscopic biopsy on 10/27/17 for tissue sampling. MRI had shown widespread metastatic disease to the brain. Report:Approximately 22 individual metastases are identified ranging  from punctate to 16 mm diameter. Multifocal associated vasogenic edema in the cerebral hemispheres, although there is only trace associated leftward midline shift and there is no ventriculomegaly or impending herniation. Small C4 vertebral body metastasis suspected. No skull metastasis identified.  Continue dexamethasone PO on discharge.  Continue GI prophylaxis Port-A-Cath was placed on 10/25/17. Patient underwent whole brain and chest radiation simulation.She has also been started on radiation therapy Symptoms of headache has improved. Biopsy report will be followed. She will follow-up with radiation -oncology/oncology as an outpatient.  Lung mass: Unknown type.  Biopsy sent.  Unsteady gait/generalized weakness: Much improved.  PT/OT eval to the patient and recommended no follow-up.  History of breast cancer: History of invasive lobar carcinoma.  Status post bilateral mastectomy with reconstruction and tamoxifen for 5 years .She will follow-up with oncology on discharge.  Depression/anxiety: We will continue her home meds, venlafaxine and alprazolam  History of lupus/rheumatoid arthritis/Sjogren's syndrome: On Azothioprine 50 mg po BID and Hydroxychloroquine 200 mg po BID  Autoimmune hepatitis/abnormal LFTs: Improved  today.  We will continue to monitor.  Ultrasound of the right upper quadrant did not show any abnormalities. Hepatitis panel negative.  Patient denies any abdominal pain. Follow-up liver function tests in a week  Microcytic anemia: Currently H&H is stable.    Moderate malnutrition: Most likely associated with malignancy.  Nutrition was following.  Hypokalemia/hypocalcemia: Supplemented this morning.  Continue potassium supplementation at home.  Check potassium level in a week as an outpatient  Discharge Diagnoses:  Active Problems:   Lupus   Autoimmune hepatitis ([place])   Breast cancer ([place])   Depression   Anxiety   Fibromyalgia   Lung mass   Brain edema  ([facility])   Malnutrition of moderate degree    Discharge Instructions  Discharge  Instructions    Diet - low sodium heart healthy   Complete by:  As directed    Discharge instructions   Complete by:  As directed    1) Follow up with your PCP as soon as possible. 2) Follow up with your oncologist and radiation oncology as an outpatient in the given appointment dates. 3) Check CBC , BMP,LFT(liver function tests) in a week. 4) Take prescribed medications as instructed.   Increase activity slowly   Complete by:  As directed      Allergies as of 10/28/2017      Reactions   Oxycodone Anaphylaxis, Hives, Swelling   Whelps and swelling in throat   Doxycycline Nausea And Vomiting   Prednisone Nausea And Vomiting, Diarrhea   Can take IV steroids      Medication List    TAKE these medications   acetaminophen 500 MG tablet Commonly known as:  TYLENOL Take 1,000 mg by mouth every 6 (six) hours as needed.   ALPRAZolam 0.5 MG tablet Commonly known as:  XANAX Take 0.5 mg by mouth at bedtime as needed for anxiety.   azaTHIOprine 50 MG tablet Commonly known as:  IMURAN Take 50 mg by mouth 2 (two) times daily.   dexamethasone 4 MG tablet Commonly known as:  DECADRON Take 1 tablet (4 mg total) by mouth every 6 (six) hours for 15 days.   HM VITAMIN D3 4000 units Caps Generic drug:  Cholecalciferol Take by mouth.   hydroxychloroquine 200 MG tablet Commonly known as:  PLAQUENIL Take 200 mg by mouth 2 (two) times daily.   pantoprazole 40 MG tablet Commonly known as:  PROTONIX Take 1 tablet (40 mg total) by mouth daily.   venlafaxine XR 150 MG 24 hr capsule Commonly known as:  EFFEXOR-XR Take 2 capsules by mouth daily with breakfast.      Follow-up Information    [name], MD. Schedule an appointment as soon as possible for a visit in 1 week(s).   Specialty:  Family Medicine Contact information: [address] [place] [address] [phone]           Allergies  Allergen Reactions  . Oxycodone Anaphylaxis, Hives and Swelling    Whelps and swelling in throat  . Doxycycline Nausea And Vomiting  . Prednisone Nausea And Vomiting and Diarrhea    Can take IV steroids    Consultations:  PCCM, radiation oncology   Procedures/Studies: Ct Chest Wo Contrast  Addendum Date: 10/12/2017   ADDENDUM REPORT: 10/12/2017 21:51 ADDENDUM: These results will be called to the ordering clinician or representative by the Radiologist Assistant, and communication documented in the PACS or zVision Dashboard. Electronically Signed   By: Fidela Salisbury M.D.   On: 10/12/2017 21:51   Result Date: 10/12/2017 CLINICAL DATA:  Evaluate pneumonia post multiple course of antibiotics. EXAM: CT CHEST WITHOUT CONTRAST TECHNIQUE: Multidetector CT imaging of the chest was performed following the standard protocol without IV contrast. COMPARISON:  Chest x-ray 10/06/2017 history of left breast cancer. FINDINGS: Cardiovascular: No significant vascular findings. Normal heart size. No pericardial effusion. Mediastinum/Nodes: Mediastinal lymphadenopathy with lymph nodes measuring up to 19 mm in short axis. Bulky left hilar lymphadenopathy. Normal trachea and esophagus. 1.5 cm hypoattenuated mass within the right lobe of the thyroid gland. Lungs/Pleura: Mild upper lobe predominant emphysematous changes. Bilobed low-attenuation soft tissue mass in the lingula measures 8.4 by 5.6 by 5.5 cm and abuts the pericardium and anterior mediastinum medially and the pleural surface laterally. There is  probable extension along the pleural surface. Surrounding interstitial thickening suspicious for lymphangitic spread of tumor. Small left pleural effusion. Several less than 5 mm pulmonary nodules in the right upper lobe. Upper Abdomen: No acute abnormality. Musculoskeletal: Lower thoracic and lower cervical fusion with intact hardware. No evidence of suspicious lesions. Post bilateral mastectomy  and implant reconstruction. Post left axillary lymph node dissection. IMPRESSION: Left middle lobe heterogeneous possibly necrotic soft tissue mass, measuring up to 8.4 cm, highly suspicious for malignancy. The mass abuts the mediastinal border/pericardium medially and the pleural surface laterally. Suspected pleural extension of tumor, as well as lymphangitic spread of malignancy in the rest of the lung parenchyma of the lingula and portions of the left upper lobe. Tissue diagnosis and PET-CT may be considered. Bulky left hilar lymphadenopathy and mediastinal lymphadenopathy. Few less than 5 mm solid and ground-glass nodules in the right upper lobe with indeterminate significance. 1.5 cm right thyroid mass. Post bilateral mastectomy with implant reconstruction. Post left axillary lymph node dissection. Determined left axillary lymph nodes, which could be further evaluated with focused axillary ultrasound. Emphysema (ICD10-J43.9). Electronically Signed: By: Fidela Salisbury M.D. On: 10/12/2017 20:20   Mr Jeri Cos XK Contrast  Addendum Date: 10/23/2017   ADDENDUM REPORT: 10/23/2017 12:32 ADDENDUM: Study discussed by telephone with Dr. Irene Limbo, who is on-call for provider Mikey Bussing, on 10/23/2017 at 1227 hours. Electronically Signed   By: Genevie Ann M.D.   On: 10/23/2017 12:32   Result Date: 10/23/2017 CLINICAL DATA:  52 year old female with metastatic bronchogenic carcinoma diagnosed recently on PET-CT. Abnormal CT appearance of the right hemisphere on the recent PET images. Headaches. Staging. EXAM: MRI HEAD WITHOUT AND WITH CONTRAST TECHNIQUE: Multiplanar, multiecho pulse sequences of the brain and surrounding structures were obtained without and with intravenous contrast. CONTRAST:  66m MULTIHANCE GADOBENATE DIMEGLUMINE 529 MG/ML IV SOLN COMPARISON:  PET-CT 10/19/2017.  Cervical spine MRI 12/09/2003. FINDINGS: Brain: Numerous enhancing brain metastases scattered in both hemispheres and the right cerebellum.  Approximately 21 individual metastases are identified, ranging from punctate to 16 millimeters diameter. All lesions are annotated on series 11. The largest lesions are surrounded by vasogenic edema, and/or in the anterior superior right frontal lobe, posterior left frontal and parietal lobe, anterior inferior right frontal lobe, and several adjacent lesions in the right temporal lobe (including the largest on series 11, image 22. There is trace leftward midline shift. Basilar cisterns remain widely patent. There is mild mass effect on the lateral ventricles with no ventriculomegaly. None of the metastasis appear to be hemorrhagic. Many demonstrate restricted diffusion compatible with hyper cellularity. No associated leptomeningeal or other dural thickening. Scattered superimposed small nonenhancing nonspecific cerebral white matter T2 and FLAIR hyperintense foci. Mild patchy nonspecific T2 hyperintensity in the pons. No extra-axial collection or acute intracranial hemorrhage. Cervicomedullary junction and pituitary are within normal limits. Vascular: Major intracranial vascular flow voids are preserved. The major dural venous sinuses are enhancing and appear patent. Skull and upper cervical spine: Cervical spine degeneration but superimposed suspicious marrow signal in the anterior C4 vertebral body, which seems to correspond to abnormal PET activity on 10/19/2017 (series 3, image 12 today). Negative visible cervical spinal cord. No suspicious calvarium marrow signal. Sinuses/Orbits: Orbit soft tissues are within normal limits sinus mucosal thickening. There is moderate widespread paranasal. Other: Visible internal auditory structures appear normal. Mastoid air cells are clear. Trace retained secretions in the nasopharynx. Scalp and face soft tissues appear negative. IMPRESSION: 1. Positive for widespread metastatic disease to the brain. Approximately  22 individual metastases are identified ranging from punctate to  16 mm diameter. 2. Multifocal associated vasogenic edema in the cerebral hemispheres, although there is only trace associated leftward midline shift and there is no ventriculomegaly or impending herniation. 3. Small C4 vertebral body metastasis suspected. No skull metastasis identified. Electronically Signed: By: Genevie Ann M.D. On: 10/23/2017 11:34   Nm Pet Image Initial (pi) Skull Base To Thigh  Result Date: 10/20/2017 CLINICAL DATA:  Initial treatment strategy for history of breast cancer. Left upper lobe/lingular mass. EXAM: NUCLEAR MEDICINE PET SKULL BASE TO THIGH TECHNIQUE: 5.9 mCi F-18 FDG was injected intravenously. Full-ring PET imaging was performed from the skull base to thigh after the radiotracer. CT data was obtained and used for attenuation correction and anatomic localization. FASTING BLOOD GLUCOSE:  Value: 100 mg/dl COMPARISON:  Chest CT of 10/12/2017.  Abdominal CT of 08/13/2017. FINDINGS: Mediastinal blood pool activity: SUV max equal 2.2 NECK: Low left jugular/supraclavicular tiny node corresponds to hypermetabolism. This measures 4 mm and a S.U.V. max of 3.4 on image 39/4. Incidental CT findings: No cervical adenopathy. Right frontal and temporal lobe hypoattenuation, including on image 1/4. Suboptimally evaluated. Nonspecific right thyroid hypoattenuating nodule of 10 mm. CHEST: Centrally necrotic lingular primary measures on the order of 5.8 x 5.7 cm and a S.U.V. max of 13.9 on image 35/8. Surrounding ground-glass and airspace disease is favored to represent postobstructive pneumonitis versus lymphangitic tumor spread. Tiny left pleural effusion demonstrates nonspecific low-level hypermetabolism. Bilateral mediastinal nodal metastasis, including a right paratracheal node which measures 1.9 cm and a S.U.V. max of 18.1 on image 62/4. Incidental CT findings: Bilateral breast implants. Left axillary node dissection. Nonspecific bilateral pulmonary nodules, below PET resolution. ABDOMEN/PELVIS:  Hypermetabolic left adrenal nodule measures 2.4 cm and a S.U.V. max of 11.5 on image 107/4. No other abnormal soft tissue hypermetabolism identified. Incidental CT findings: Abdominal aortic atherosclerosis. Hysterectomy. SKELETON: Left humeral head lesion measures 8 mm and a S.U.V. max of 7.1 on image 38/4. A left acetabular lesion is CT occult and measures a S.U.V. max of 5.9. Incidental CT findings: Lumbar spine fixation. Cervical spine fixation. IMPRESSION: 1. Hypermetabolic lingular mass with cervical/thoracic nodal, left adrenal, and bone metastasis. Most consistent with primary bronchogenic carcinoma. 2. Tiny left pleural effusion with nonspecific low-level hypermetabolism. 3. Postobstructive pneumonitis and/or lymphangitic tumor spread more peripheral within the lingula. 4. Hypoattenuation within the right frontal and temporal lobes is suboptimally evaluated. 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 If not previously evaluated, consider further evaluation with pre and post-contrast brain MR. 5.  Aortic Atherosclerosis (ICD10-I70.0). Electronically Signed   By: Abigail Miyamoto M.D.   On: 10/20/2017 08:43   Ir US Guide Vasc Access Right  Result Date: 10/25/2017 INDICATION: History of recurrent metastatic breast cancer. In need of durable intravenous access for chemotherapy administration. EXAM: IMPLANTED PORT A CATH PLACEMENT WITH ULTRASOUND AND FLUOROSCOPIC GUIDANCE COMPARISON:  PET-CT - 10/19/2017 MEDICATIONS: Ancef 2 gm IV; The antibiotic was administered within an appropriate time interval prior to skin puncture. ANESTHESIA/SEDATION: Moderate (conscious) sedation was employed during this procedure. A total of Versed 2 mg and Fentanyl 100 mcg was administered intravenously. Moderate Sedation Time: 24 minutes. The patient's level of consciousness and vital signs were monitored continuously by radiology nursing throughout the procedure under my direct supervision. CONTRAST:  None FLUOROSCOPY TIME:  18  seconds (3 mGy) COMPLICATIONS: None immediate. PROCEDURE: The procedure, risks, benefits, and alternatives were explained to the patient. Questions regarding the procedure were encouraged and answered. The  patient understands and consents to the procedure. The right neck and chest were prepped with chlorhexidine in a sterile fashion, and a sterile drape was applied covering the operative field. Maximum barrier sterile technique with sterile gowns and gloves were used for the procedure. A timeout was performed prior to the initiation of the procedure. Local anesthesia was provided with 1% lidocaine with epinephrine. After creating a small venotomy incision, a micropuncture kit was utilized to access the internal jugular vein. Real-time ultrasound guidance was utilized for vascular access including the acquisition of a permanent ultrasound image documenting patency of the accessed vessel. The microwire was utilized to measure appropriate catheter length. A subcutaneous port pocket was then created along the upper chest wall utilizing a combination of sharp and blunt dissection. The pocket was irrigated with sterile saline. A single lumen thin power injectable port was chosen for placement. The 8 Fr catheter was tunneled from the port pocket site to the venotomy incision. The port was placed in the pocket. The external catheter was trimmed to appropriate length. At the venotomy, an 8 Fr peel-away sheath was placed over a guidewire under fluoroscopic guidance. The catheter was then placed through the sheath and the sheath was removed. Final catheter positioning was confirmed and documented with a fluoroscopic spot radiograph. The port was accessed with a Huber needle, aspirated and flushed. The venotomy site was closed with an interrupted 4-0 Vicryl suture. The port pocket incision was closed with interrupted 2-0 Vicryl suture and the skin was opposed with a running subcuticular 4-0 Vicryl suture. Dermabond and  Steri-strips were applied to both incisions. Port a Catheter was left accessed for inpatient hospitalization. Dressings were placed. The patient tolerated the procedure well without immediate post procedural complication. FINDINGS: After catheter placement, the tip lies within the superior cavoatrial junction. The catheter aspirates and flushes normally and is ready for immediate use. IMPRESSION: Successful placement of a right internal jugular approach power injectable Port-A-Cath. Port a Catheter was left accessed and is ready for immediate use. Electronically Signed   By: Sandi Mariscal M.D.   On: 10/25/2017 17:30   Dg Chest Port 1 View  Result Date: 10/27/2017 CLINICAL DATA:  Status post bronchoscopy EXAM: PORTABLE CHEST 1 VIEW COMPARISON:  10/06/2017 FINDINGS: Persistent lingular airspace opacity. Interval biopsy. No pleural effusion or pneumothorax. Right lung is clear. Stable cardiomediastinal silhouette. Right-sided Port-A-Cath in satisfactory position. No acute osseous abnormality. Surgical clips in the left axilla again noted. IMPRESSION: 1. Persistent lingular airspace opacity with interval biopsy. No pneumothorax. Electronically Signed   By: Kathreen Devoid   On: 10/27/2017 12:43   Ir Cyndy Freeze Guide Port Insertion Right  Result Date: 10/25/2017 INDICATION: History of recurrent metastatic breast cancer. In need of durable intravenous access for chemotherapy administration. EXAM: IMPLANTED PORT A CATH PLACEMENT WITH ULTRASOUND AND FLUOROSCOPIC GUIDANCE COMPARISON:  PET-CT - 10/19/2017 MEDICATIONS: Ancef 2 gm IV; The antibiotic was administered within an appropriate time interval prior to skin puncture. ANESTHESIA/SEDATION: Moderate (conscious) sedation was employed during this procedure. A total of Versed 2 mg and Fentanyl 100 mcg was administered intravenously. Moderate Sedation Time: 24 minutes. The patient's level of consciousness and vital signs were monitored continuously by radiology nursing throughout  the procedure under my direct supervision. CONTRAST:  None FLUOROSCOPY TIME:  18 seconds (3 mGy) COMPLICATIONS: None immediate. PROCEDURE: The procedure, risks, benefits, and alternatives were explained to the patient. Questions regarding the procedure were encouraged and answered. The patient understands and consents to the procedure. The right neck  and chest were prepped with chlorhexidine in a sterile fashion, and a sterile drape was applied covering the operative field. Maximum barrier sterile technique with sterile gowns and gloves were used for the procedure. A timeout was performed prior to the initiation of the procedure. Local anesthesia was provided with 1% lidocaine with epinephrine. After creating a small venotomy incision, a micropuncture kit was utilized to access the internal jugular vein. Real-time ultrasound guidance was utilized for vascular access including the acquisition of a permanent ultrasound image documenting patency of the accessed vessel. The microwire was utilized to measure appropriate catheter length. A subcutaneous port pocket was then created along the upper chest wall utilizing a combination of sharp and blunt dissection. The pocket was irrigated with sterile saline. A single lumen thin power injectable port was chosen for placement. The 8 Fr catheter was tunneled from the port pocket site to the venotomy incision. The port was placed in the pocket. The external catheter was trimmed to appropriate length. At the venotomy, an 8 Fr peel-away sheath was placed over a guidewire under fluoroscopic guidance. The catheter was then placed through the sheath and the sheath was removed. Final catheter positioning was confirmed and documented with a fluoroscopic spot radiograph. The port was accessed with a Huber needle, aspirated and flushed. The venotomy site was closed with an interrupted 4-0 Vicryl suture. The port pocket incision was closed with interrupted 2-0 Vicryl suture and the skin  was opposed with a running subcuticular 4-0 Vicryl suture. Dermabond and Steri-strips were applied to both incisions. Port a Catheter was left accessed for inpatient hospitalization. Dressings were placed. The patient tolerated the procedure well without immediate post procedural complication. FINDINGS: After catheter placement, the tip lies within the superior cavoatrial junction. The catheter aspirates and flushes normally and is ready for immediate use. IMPRESSION: Successful placement of a right internal jugular approach power injectable Port-A-Cath. Port a Catheter was left accessed and is ready for immediate use. Electronically Signed   By: Sandi Mariscal M.D.   On: 10/25/2017 17:30   US Abdomen Limited Ruq  Result Date: 10/27/2017 CLINICAL DATA:  Elevated liver function tests. Breast cancer patient. EXAM: ULTRASOUND ABDOMEN LIMITED RIGHT UPPER QUADRANT COMPARISON:  PET scan 10/19/2017 FINDINGS: Gallbladder: No gallstones or wall thickening visualized. No sonographic Murphy sign noted by sonographer. Common bile duct: Diameter: 2.7 mm common normal Liver: No focal lesion identified. Within normal limits in parenchymal echogenicity. Portal vein is patent on color Doppler imaging with normal direction of blood flow towards the liver. IMPRESSION: Normal right upper quadrant ultrasound. No abnormality seen to explain abnormal lab results. Electronically Signed   By: Nelson Chimes M.D.   On: 10/27/2017 10:02   (Echo, Carotid, EGD, Colonoscopy, ERCP)    Subjective:  Patient seen and examined the bedside this morning.  Remains comfortable.  No new issues/events.  She has  underwent addition therapy today. Stable for discharge to home today.  Discharge Exam: Vitals:   10/27/17 2103 10/28/17 0423  BP: (!) 153/97 108/80  Pulse: (!) 56 (!) 59  Resp: 16 18  Temp: 98.2 F (36.8 C) 98.2 F (36.8 C)  SpO2: 96% 99%   Vitals:   10/27/17 1250 10/27/17 1321 10/27/17 2103 10/28/17 0423  BP: 136/70 118/75 (!)  153/97 108/80  Pulse: (!) 55 (!) 56 (!) 56 (!) 59  Resp: 14  16 18   Temp:  97.7 F (36.5 C) 98.2 F (36.8 C) 98.2 F (36.8 C)  TempSrc:  Oral Oral Oral  SpO2: 95% 100% 96% 99%  Weight:      Height:        General: Pt is alert, awake, not in acute distress Cardiovascular: RRR, S1/S2 +, no rubs, no gallops Respiratory: CTA bilaterally, no wheezing, no rhonchi Abdominal: Soft, NT, ND, bowel sounds + Extremities: no edema, no cyanosis    The results of significant diagnostics from this hospitalization (including imaging, microbiology, ancillary and laboratory) are listed below for reference.     Microbiology: No results found for this or any previous visit (from the past 240 hour(s)).   Labs: BNP (last 3 results) No results for input(s): BNP in the last 8760 hours. Basic Metabolic Panel: Recent Labs  Lab 10/25/17 0422 10/26/17 0804 10/27/17 0515 10/28/17 0423 10/28/17 0838 10/28/17 1628  NA 141 138 139 142  --  135  K 4.0 4.2 4.2 2.2*  --  5.1  CL 109 103 102 121*  --  98*  CO2 24 26 29  18*  --  29  GLUCOSE 122* 109* 109* 69  --  102*  BUN 7 13 15 9   --  26*  CREATININE 0.46 0.44 0.52 <0.30*  --  0.59  CALCIUM 8.8* 8.5* 8.5* 4.5*  --  9.6  MG 2.0 1.9 2.0  --  2.2  --   PHOS 3.5 3.9 4.1  --   --   --    Liver Function Tests: Recent Labs  Lab 10/24/17 0433 10/25/17 0422 10/26/17 0804 10/27/17 0515 10/28/17 0423  AST 63* 80* 245* 272* 105*  ALT 38 37 71* 122* 80*  ALKPHOS 131* 124 102 103 51  BILITOT 0.2* 0.3 0.3 0.3 0.3  PROT 6.7 6.7 6.3* 6.1* 3.1*  ALBUMIN 2.6* 2.8* 2.7* 2.5* 1.4*   No results for input(s): LIPASE, AMYLASE in the last 168 hours. No results for input(s): AMMONIA in the last 168 hours. CBC: Recent Labs  Lab 10/23/17 1502 10/24/17 0433 10/25/17 0422 10/26/17 0804 10/27/17 0515  WBC 5.5 5.3 12.6* 11.7* 10.2  NEUTROABS 3.3 4.7 11.6* 10.6* 8.8*  HGB 12.7 11.2* 11.8* 11.4* 11.3*  HCT 37.8 34.5* 35.7* 33.8* 34.5*  MCV 101.3* 103.0*  102.3* 101.8* 101.5*  PLT 370 380 397 397 382   Cardiac Enzymes: No results for input(s): CKTOTAL, CKMB, CKMBINDEX, TROPONINI in the last 168 hours. BNP: Invalid input(s): POCBNP CBG: Recent Labs  Lab 10/27/17 1316 10/27/17 1651 10/27/17 2108 10/28/17 0730 10/28/17 1135  GLUCAP 117* 201* 93 107* 92   D-Dimer No results for input(s): DDIMER in the last 72 hours. Hgb A1c No results for input(s): HGBA1C in the last 72 hours. Lipid Profile No results for input(s): CHOL, HDL, LDLCALC, TRIG, CHOLHDL, LDLDIRECT in the last 72 hours. Thyroid function studies No results for input(s): TSH, T4TOTAL, T3FREE, THYROIDAB in the last 72 hours.  Invalid input(s): FREET3 Anemia work up No results for input(s): VITAMINB12, FOLATE, FERRITIN, TIBC, IRON, RETICCTPCT in the last 72 hours. Urinalysis    Component Value Date/Time   COLORURINE YELLOW 09/30/2014 1540   APPEARANCEUR CLOUDY (A) 09/30/2014 1540   LABSPEC 1.015 09/30/2014 1540   PHURINE 6.0 09/30/2014 1540   GLUCOSEU NEGATIVE 09/30/2014 1540   HGBUR NEGATIVE 09/30/2014 1540   BILIRUBINUR NEGATIVE 09/30/2014 1540   KETONESUR NEGATIVE 09/30/2014 1540   PROTEINUR NEGATIVE 09/30/2014 1540   UROBILINOGEN 0.2 09/30/2014 1540   NITRITE NEGATIVE 09/30/2014 1540   LEUKOCYTESUR NEGATIVE 09/30/2014 1540   Sepsis Labs Invalid input(s): PROCALCITONIN,  WBC,  LACTICIDVEN Microbiology No results found for  this or any previous visit (from the past 240 hour(s)).   Time coordinating discharge: Over 30 minutes  SIGNED:   Marene Lenz, MD  [facility] 10/28/2017, 5:36 PM Pager [phone]  If 7PM-7AM, please contact night-coverage [email or web address] Password [email or web address]

## 2017-10-28 NOTE — Progress Notes (Signed)
PT discharged from the unit via wheelchair. Discharge instructions were reviewed with the patient. Port de-accessed before d/c. No questions or concerns at this time.

## 2017-10-28 NOTE — Progress Notes (Signed)
[facility] Therapy Treatment Record Phone [phone]   Radiation Therapy was administered to Diana Schwartz on: 10/28/2017  12:37 PM and was treatment # 1out of a planned course of 10 treatments.  Radiation Treatment  1). Beam photons with 6-10 energy  2). Brachytherapy None  3). Stereotactic Radiosurgery None  4). Other Radiation None     Hershell Brandl, RT (T)

## 2017-10-29 ENCOUNTER — Encounter: Payer: Self-pay | Admitting: *Deleted

## 2017-10-29 ENCOUNTER — Ambulatory Visit
Admission: RE | Admit: 2017-10-29 | Discharge: 2017-10-29 | Disposition: A | Discharge status: Home / Self Care | Payer: 59 | Source: Ambulatory Visit | Attending: Radiation Oncology | Admitting: Radiation Oncology

## 2017-10-29 DIAGNOSIS — C3412 Malignant neoplasm of upper lobe, left bronchus or lung: Secondary | ICD-10-CM | POA: Diagnosis not present

## 2017-10-29 DIAGNOSIS — C7931 Secondary malignant neoplasm of brain: Secondary | ICD-10-CM

## 2017-10-29 DIAGNOSIS — Z510 Encounter for antineoplastic radiation therapy: Secondary | ICD-10-CM | POA: Diagnosis not present

## 2017-10-29 MED ORDER — SONAFINE EX EMUL
1.0000 "application " | Freq: Two times a day (BID) | CUTANEOUS | Status: DC
  Administered 2017-10-29: 1 via TOPICAL

## 2017-10-29 NOTE — Progress Notes (Signed)
Oncology Nurse Navigator Documentation  Oncology Nurse Navigator Flowsheets 10/29/2017  Navigator Location [facility]  Navigator Encounter Type Other/I followed up on Diana Schwartz's pathology. It is completed. I contacted Dr. Julien Nordmann with an update and when to schedule her back to see him.   Treatment Phase Treatment  Barriers/Navigation Needs Coordination of Care  Interventions Coordination of Care  Coordination of Care Other  Acuity Level 1  Time Spent with Patient 15

## 2017-10-29 NOTE — Progress Notes (Signed)
Pt here for patient teaching.  Pt given Radiation and You booklet, skin care instructions and Sonafine.  Reviewed areas of pertinence such as fatigue, hair loss, nausea and vomiting, skin changes, throat changes, headache, blurry vision, cough, shortness of breath and earaches . Pt able to give teach back of to pat skin and use unscented/gentle soap,apply Sonafine bid, avoid applying anything to skin within 4 hours of treatment and avoid wearing an under wire bra. Pt demonstrated understanding, needs reinforcement, no evidence of learning, refused teaching and  of information given and will contact nursing with any questions or concerns.     [email or web address]

## 2017-10-29 NOTE — Progress Notes (Signed)
Oncology Nurse Navigator Documentation  Oncology Nurse Navigator Flowsheets 10/29/2017  Navigator Location [facility]  Navigator Encounter Type Other/per Dr. Julien Nordmann, I requested foundation one and PDL 1 testing.   Treatment Phase Treatment  Barriers/Navigation Needs Coordination of Care  Interventions Coordination of Care  Coordination of Care Other  Acuity Level 2  Time Spent with Patient 15

## 2017-11-01 ENCOUNTER — Other Ambulatory Visit: Payer: Self-pay | Admitting: Oncology

## 2017-11-01 ENCOUNTER — Ambulatory Visit
Admission: RE | Admit: 2017-11-01 | Discharge: 2017-11-01 | Disposition: A | Discharge status: Home / Self Care | Payer: 59 | Source: Ambulatory Visit | Attending: Radiation Oncology | Admitting: Radiation Oncology

## 2017-11-01 DIAGNOSIS — Z510 Encounter for antineoplastic radiation therapy: Secondary | ICD-10-CM | POA: Diagnosis not present

## 2017-11-01 DIAGNOSIS — C3490 Malignant neoplasm of unspecified part of unspecified bronchus or lung: Secondary | ICD-10-CM

## 2017-11-02 ENCOUNTER — Ambulatory Visit
Admission: RE | Admit: 2017-11-02 | Discharge: 2017-11-02 | Disposition: A | Discharge status: Home / Self Care | Payer: 59 | Source: Ambulatory Visit | Attending: Radiation Oncology | Admitting: Radiation Oncology

## 2017-11-02 ENCOUNTER — Encounter: Payer: Self-pay | Admitting: *Deleted

## 2017-11-02 ENCOUNTER — Encounter: Payer: Self-pay | Admitting: Urology

## 2017-11-02 ENCOUNTER — Other Ambulatory Visit: Payer: Self-pay | Admitting: Urology

## 2017-11-02 DIAGNOSIS — R569 Unspecified convulsions: Secondary | ICD-10-CM

## 2017-11-02 DIAGNOSIS — Z510 Encounter for antineoplastic radiation therapy: Secondary | ICD-10-CM | POA: Diagnosis not present

## 2017-11-02 DIAGNOSIS — C7931 Secondary malignant neoplasm of brain: Secondary | ICD-10-CM

## 2017-11-02 MED ORDER — LEVETIRACETAM 500 MG PO TABS: 500.0000 mg | ORAL_TABLET | Freq: Two times a day (BID) | ORAL | 5 refills | Status: DC

## 2017-11-02 NOTE — Progress Notes (Signed)
I saw this patient today in clinic following her regularly scheduled whole brain radiation treatment.  This was treatment #4 of 10 planned treatments.  She requests to be seen today regarding a recent fall at her home last evening.  She reports that she was walking outdoors to sit by the fire pit and could "feel it coming on" and then passed out, falling to the ground.  She thinks she lost consciousness for only a brief period and when she awoke she was crying.  At first, she was unable to respond verbally although she could hear what was going on around her.  Within only 2-3 minutes, she was able to verbally respond and has been able to communicate without difficulty since that time.  She has not had any further similar episodes and had not had any similar episodes prior to this one.  She also reports having uncontrolled shaking in the right and left upper extremities for a brief period of time earlier in the day yesterday.  This resolved spontaneously and has not recurred since.  Fortunately, she did not sustain any significant injuries from her fall only a small scrape to the right knee.  She denies any other neurologic complaints, specifically headaches, double or blurred vision, difficulty word finding or jumbled speech.  She denies tinnitus or decreased auditory acuity.  Otherwise, she continues to feel well in general and is tolerating her radiation treatments well.  She continues taking Decadron 4mg  po QID. I called and spoke with Dr. Mickeal Skinner who was kind enough to provide me with some direction.  We will go ahead and start Keppra 500 mg p.o. twice daily and he will see her in the clinic either Wednesday or Thursday this week for further evaluation.  I greatly appreciate his guidance.  The patient and her husband are comfortable with and or in agreement with this plan.    Diana Johns, PA-C

## 2017-11-02 NOTE — Progress Notes (Signed)
Oncology Nurse Navigator Documentation  Oncology Nurse Navigator Flowsheets 11/02/2017  Navigator Location [facility]  Navigator Encounter Type Other/per Dr. Julien Nordmann, I notified scheduling to call and schedule lab appt for blood test EGFR  Abnormal Finding Date 10/12/2017  Confirmed Diagnosis Date 10/27/2017  Treatment Initiated Date 10/28/2017  Treatment Phase Treatment  Barriers/Navigation Needs Coordination of Care  Interventions Coordination of Care  Coordination of Care Other  Acuity Level 2  Time Spent with Patient 30

## 2017-11-03 ENCOUNTER — Inpatient Hospital Stay: Payer: 59

## 2017-11-03 ENCOUNTER — Ambulatory Visit
Admission: RE | Admit: 2017-11-03 | Discharge: 2017-11-03 | Disposition: A | Discharge status: Home / Self Care | Payer: 59 | Source: Ambulatory Visit | Attending: Radiation Oncology | Admitting: Radiation Oncology

## 2017-11-03 ENCOUNTER — Encounter: Payer: Self-pay | Admitting: Internal Medicine

## 2017-11-03 ENCOUNTER — Inpatient Hospital Stay (HOSPITAL_BASED_OUTPATIENT_CLINIC_OR_DEPARTMENT_OTHER): Payer: 59 | Admitting: Internal Medicine

## 2017-11-03 VITALS — BP 87/77 | HR 75 | Temp 98.8°F | Resp 18 | Ht 62.0 in | Wt 121.7 lb

## 2017-11-03 DIAGNOSIS — C342 Malignant neoplasm of middle lobe, bronchus or lung: Secondary | ICD-10-CM | POA: Diagnosis not present

## 2017-11-03 DIAGNOSIS — Z9223 Personal history of estrogen therapy: Secondary | ICD-10-CM | POA: Diagnosis not present

## 2017-11-03 DIAGNOSIS — Z853 Personal history of malignant neoplasm of breast: Secondary | ICD-10-CM | POA: Diagnosis not present

## 2017-11-03 DIAGNOSIS — Z170 Estrogen receptor positive status [ER+]: Secondary | ICD-10-CM | POA: Diagnosis not present

## 2017-11-03 DIAGNOSIS — R110 Nausea: Secondary | ICD-10-CM | POA: Diagnosis not present

## 2017-11-03 DIAGNOSIS — R918 Other nonspecific abnormal finding of lung field: Secondary | ICD-10-CM | POA: Diagnosis not present

## 2017-11-03 DIAGNOSIS — Z510 Encounter for antineoplastic radiation therapy: Secondary | ICD-10-CM | POA: Diagnosis not present

## 2017-11-03 DIAGNOSIS — M069 Rheumatoid arthritis, unspecified: Secondary | ICD-10-CM | POA: Diagnosis not present

## 2017-11-03 DIAGNOSIS — Z9013 Acquired absence of bilateral breasts and nipples: Secondary | ICD-10-CM | POA: Diagnosis not present

## 2017-11-03 DIAGNOSIS — Z79899 Other long term (current) drug therapy: Secondary | ICD-10-CM | POA: Diagnosis not present

## 2017-11-03 DIAGNOSIS — C7931 Secondary malignant neoplasm of brain: Secondary | ICD-10-CM

## 2017-11-03 DIAGNOSIS — C771 Secondary and unspecified malignant neoplasm of intrathoracic lymph nodes: Secondary | ICD-10-CM | POA: Diagnosis not present

## 2017-11-03 DIAGNOSIS — K754 Autoimmune hepatitis: Secondary | ICD-10-CM | POA: Diagnosis not present

## 2017-11-03 DIAGNOSIS — C7951 Secondary malignant neoplasm of bone: Secondary | ICD-10-CM | POA: Diagnosis not present

## 2017-11-03 DIAGNOSIS — R569 Unspecified convulsions: Secondary | ICD-10-CM

## 2017-11-03 DIAGNOSIS — Z923 Personal history of irradiation: Secondary | ICD-10-CM | POA: Diagnosis not present

## 2017-11-03 NOTE — Progress Notes (Signed)
[facility] [place], [place] [address] [phone]   New Patient Evaluation  Date of Service: 11/03/17 Patient Name: Diana Schwartz Patient MRN: [id number] Patient DOB: 1965-11-20 Provider: Ventura Sellers, MD  Identifying Statement:  Diana Schwartz is a 52 y.o. female with Brain metastases (Diana Schwartz) [C79.31] who presents for initial consultation and evaluation regarding cancer associated neurologic deficits.    Referring Provider: [name], MD [place], 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  Diana Schwartz presents today for evaluation given reports of seizures this week.  She was recently diagnosed with lung cancer (tumor genetics pending) and innumerable brain metastases, and was started on WBRT this past week.  On Monday, she and her family describe a witnessed episode of "sudden loss of consciousness leading to fall", followed by "2-3 minutes of black out" and "30 minutes of inability to speak and confusion after waking up".  This was followed over the next 48 hours with 3 episodes of "either left or right arm shaking and head turning, along with some impairment of awareness".  Prior to all this, over ~2 weeks she does acknowledge various episodes of "smelling something burning" in the absence of any real cause for such a smell.  She has been taking 4mg  QID decadron since 3/3 and describes poor sleep pattern over that time.  She was started yesterday on Keppra 500mg  BID.  She does not yet have a treatment plan for her lung cancer.   Medications: Current Outpatient Medications on File Prior to Visit  Medication Sig Dispense Refill  . acetaminophen (TYLENOL) 500 MG tablet Take 1,000 mg by mouth every 6 (six) hours as needed.    .  ALPRAZolam (XANAX) 0.5 MG tablet Take 0.5 mg by mouth at bedtime as needed for anxiety.    Marland Kitchen azaTHIOprine (IMURAN) 50 MG tablet Take 50 mg by mouth 2 (two) times daily.    . Cholecalciferol (HM VITAMIN D3) 4000 units CAPS Take by mouth.    . dexamethasone (DECADRON) 4 MG tablet Take 1 tablet (4 mg total) by mouth every 6 (six) hours for 15 days. 60 tablet 0  . hydroxychloroquine (PLAQUENIL) 200 MG tablet Take 200 mg by mouth 2 (two) times daily.    Marland Kitchen levETIRAcetam (KEPPRA) 500 MG tablet Take 1 tablet (500 mg total) by mouth 2 (two) times daily. 60 tablet 5  . pantoprazole (PROTONIX) 40 MG tablet Take 1 tablet (40 mg total) by mouth daily. 30 tablet 0  . venlafaxine XR (EFFEXOR-XR) 150 MG 24 hr capsule Take 2 capsules by mouth daily with breakfast.      No current facility-administered medications on file prior to visit.     Allergies:  Allergies  Allergen Reactions  . Oxycodone Anaphylaxis, Hives and Swelling    Whelps and swelling in throat  . Doxycycline Nausea And Vomiting  . Prednisone Nausea And Vomiting and Diarrhea    Can take IV steroids   Past Medical History:  Past Medical History:  Diagnosis Date  . Anxiety   . Autoimmune hepatitis ([place])    no current med.  . Breast cancer [facility]) 2013   Left breast cancer. Treated at [facility]. Per notes, nvasive lobular carcinoma. S/P bilateral mastectomy with recontruction and Tamoxifen X5 years.   Marland Kitchen  Carpal tunnel syndrome of left wrist   . Degenerative disc disease, lumbar   . Depression   . Fibromyalgia   . Lupus (systemic lupus erythematosus) ([place])   . Osteoarthritis   . Rash    arms, chest - due to lupus  . Raynaud disease   . Rheumatoid arthritis ([place])   . Sjoegren syndrome [facility])    Past Surgical History:  Past Surgical History:  Procedure Laterality Date  . ANTERIOR CERVICAL DECOMP/DISCECTOMY FUSION  02/06/2004   C5-6, C6-7  . AXILLARY NODE DISSECTION Left 08/2012  . BREAST RECONSTRUCTION  06/2013  . CARPAL TUNNEL RELEASE  Right 1989  . CARPAL TUNNEL RELEASE Left 10/02/2013   Procedure: LEFT CARPAL TUNNEL RELEASE;  Surgeon: Linna Hoff, MD;  Location[facility];  Service: Orthopedics;  Laterality: Left;  Local Anesthesia with IV Sedation  . ENDOBRONCHIAL ULTRASOUND Bilateral 10/27/2017   Procedure: ENDOBRONCHIAL ULTRASOUND;  Surgeon: Marshell Garfinkel, MD;  Location: [facility] ENDOSCOPY;  Service: Cardiopulmonary;  Laterality: Bilateral;  . FOOT SURGERY Right    bunion and broke toe and then realign toe  . IR FLUORO GUIDE PORT INSERTION RIGHT  10/25/2017  . IR US GUIDE VASC ACCESS RIGHT  10/25/2017  . MASTECTOMY Bilateral 08/12/2012   LEFT BREAST CANCER  . thoracic back surgery  05/2015  . VAGINAL HYSTERECTOMY  1999   Social History:  Social History   Socioeconomic History  . Marital status: Married    Spouse name: Diana Schwartz  . Number of children: 1  . Years of education: Not on file  . Highest education level: Not on file  Social Needs  . Financial resource strain: Not on file  . Food insecurity - worry: Not on file  . Food insecurity - inability: Not on file  . Transportation needs - medical: Not on file  . Transportation needs - non-medical: Not on file  Occupational History  . Occupation: disability  Tobacco Use  . Smoking status: Former Smoker    Last attempt to quit: 05/24/2012    Years since quitting: 5.4  . Smokeless tobacco: Never Used  Substance and Sexual Activity  . Alcohol use: Yes    Alcohol/week: 0.0 oz    Comment: socially  . Drug use: No  . Sexual activity: Not on file  Other Topics Concern  . Not on file  Social History Narrative  . Not on file   Family History:  Family History  Problem Relation Age of Onset  . Lung cancer Mother   . Hypertension Mother   . Lung cancer Father   . Rheum arthritis Father   . Hypertension Father   . Colon cancer Neg Hx     Review of Systems: Constitutional: Denies fevers, chills or abnormal weight loss Eyes: Denies blurriness of  vision Ears, nose, mouth, throat, and face: Denies mucositis or sore throat Respiratory: Denies cough, dyspnea or wheezes Cardiovascular: Denies palpitation, chest discomfort or lower extremity swelling Gastrointestinal:  Denies nausea, constipation, diarrhea GU: Denies dysuria or incontinence Skin: Denies abnormal skin rashes Neurological: Per HPI Musculoskeletal: Denies joint pain, back or neck discomfort. No decrease in ROM Behavioral/Psych: Denies anxiety, disturbance in thought content, and mood instability   Physical Exam: Vitals:   11/03/17 1145  BP: (!) 87/77  Pulse: 75  Resp: 18  Temp: 98.8 F (37.1 C)  SpO2: 100%   KPS: 70. General: Alert, cooperative, pleasant, in no acute distress Head: Craniotomy scar noted, dry and intact. EENT: No conjunctival injection or scleral icterus.  Oral mucosa moist Lungs: Resp effort normal Cardiac: Regular rate and rhythm Abdomen: Soft, non-distended abdomen Skin: No rashes cyanosis or petechiae. Extremities: No clubbing or edema  Neurologic Exam: Mental Status: Awake, alert, attentive to examiner. Oriented to self and environment. Language is fluent with intact comprehension.  Cranial Nerves: Visual acuity is grossly normal. Visual fields are full. Extra-ocular movements intact with subtle nystagmus. No ptosis. Face is symmetric, tongue midline. Motor: Tone and bulk are normal. Power is full in both arms and legs. Reflexes are symmetric, no pathologic reflexes present. Intact finger to nose bilaterally Sensory: Intact to light touch and temperature Gait: Independent but slow and purposeful  Labs: I have reviewed the data as listed    Component Value Date/Time   NA 135 10/28/2017 1628   K 5.1 10/28/2017 1628   CL 98 (L) 10/28/2017 1628   CO2 29 10/28/2017 1628   GLUCOSE 102 (H) 10/28/2017 1628   BUN 26 (H) 10/28/2017 1628   CREATININE 0.59 10/28/2017 1628   CREATININE 0.68 10/22/2017 0747   CALCIUM 9.6 10/28/2017 1628    PROT 3.1 (L) 10/28/2017 0423   ALBUMIN 1.4 (L) 10/28/2017 0423   AST 105 (H) 10/28/2017 0423   AST 45 (H) 10/22/2017 0747   ALT 80 (H) 10/28/2017 0423   ALT 26 10/22/2017 0747   ALKPHOS 51 10/28/2017 0423   BILITOT 0.3 10/28/2017 0423   BILITOT 0.5 10/22/2017 0747   GFRNONAA >60 10/28/2017 1628   GFRNONAA >60 10/22/2017 0747   GFRAA >60 10/28/2017 1628   GFRAA >60 10/22/2017 0747   Lab Results  Component Value Date   WBC 10.2 10/27/2017   NEUTROABS 8.8 (H) 10/27/2017   HGB 11.3 (L) 10/27/2017   HCT 34.5 (L) 10/27/2017   MCV 101.5 (H) 10/27/2017   PLT 382 10/27/2017    Imaging:  CLINICAL DATA:  52 year old female with metastatic bronchogenic carcinoma diagnosed recently on PET-CT. Abnormal CT appearance of the right hemisphere on the recent PET images. Headaches. Staging.  EXAM: MRI HEAD WITHOUT AND WITH CONTRAST  TECHNIQUE: Multiplanar, multiecho pulse sequences of the brain and surrounding structures were obtained without and with intravenous contrast.  CONTRAST:  65mL MULTIHANCE GADOBENATE DIMEGLUMINE 529 MG/ML IV SOLN  COMPARISON:  PET-CT 10/19/2017.  Cervical spine MRI 12/09/2003.  FINDINGS: Brain: Numerous enhancing brain metastases scattered in both hemispheres and the right cerebellum. Approximately 21 individual metastases are identified, ranging from punctate to 16 millimeters diameter. All lesions are annotated on series 11.  The largest lesions are surrounded by vasogenic edema, and/or in the anterior superior right frontal lobe, posterior left frontal and parietal lobe, anterior inferior right frontal lobe, and several adjacent lesions in the right temporal lobe (including the largest on series 11, image 22.  There is trace leftward midline shift. Basilar cisterns remain widely patent. There is mild mass effect on the lateral ventricles with no ventriculomegaly. None of the metastasis appear to be hemorrhagic. Many demonstrate restricted  diffusion compatible with hyper cellularity.  No associated leptomeningeal or other dural thickening. Scattered superimposed small nonenhancing nonspecific cerebral white matter T2 and FLAIR hyperintense foci. Mild patchy nonspecific T2 hyperintensity in the pons. No extra-axial collection or acute intracranial hemorrhage. Cervicomedullary junction and pituitary are within normal limits.  Vascular: Major intracranial vascular flow voids are preserved. The major dural venous sinuses are enhancing and appear patent.  Skull and upper cervical spine: Cervical spine degeneration but superimposed suspicious marrow signal in the anterior C4 vertebral body, which seems to correspond to abnormal  PET activity on 10/19/2017 (series 3, image 12 today). Negative visible cervical spinal cord. No suspicious calvarium marrow signal.  Sinuses/Orbits: Orbit soft tissues are within normal limits sinus mucosal thickening. There is moderate widespread paranasal.  Other: Visible internal auditory structures appear normal. Mastoid air cells are clear. Trace retained secretions in the nasopharynx. Scalp and face soft tissues appear negative.  IMPRESSION: 1. Positive for widespread metastatic disease to the brain. Approximately 22 individual metastases are identified ranging from punctate to 16 mm diameter. 2. Multifocal associated vasogenic edema in the cerebral hemispheres, although there is only trace associated leftward midline shift and there is no ventriculomegaly or impending herniation. 3. Small C4 vertebral body metastasis suspected. No skull metastasis identified.  Electronically Signed: By: Genevie Ann M.D. On: 10/23/2017 11:34   Assessment/Plan Brain metastases ([place])  Focal seizure ([place])  Diana Schwartz has been experiencing focal seizures, including one with secondary generalization, which may be in some part provoked by ongoing radiation and/or recent sleep disturbance from high  dose steroids.    We recommended increasing Keppra to 1000mg  BID for more adequate protection during WBRT.    Her dose of decadron is causing functional impairment and is not sustainable long term- we recommended dosing 4mg  BID starting this evening.  She should call us if there is any difficulty with this dose adjustment, including if additional seizures occur.  We counseled her on seizure safety, including driving restrictions.    We spent twenty additional minutes teaching regarding the natural history, biology, and historical experience in the treatment of neurologic complications of cancer. We also provided teaching sheets for the patient to take home as an additional resource.  We appreciate the opportunity to participate in the care of Diana Schwartz.   She will return to clinic in 3 months following her post RT MRI brain.  All questions were answered. The patient knows to call the clinic with any problems, questions or concerns. No barriers to learning were detected.  The total time spent in the encounter was 60 minutes and more than 50% was on counseling and review of test results   Diana Sellers, MD Medical Director of Neuro-Oncology [facility] at [place] 11/03/17 2:49 PM

## 2017-11-04 ENCOUNTER — Telehealth: Payer: Self-pay | Admitting: Internal Medicine

## 2017-11-04 ENCOUNTER — Ambulatory Visit
Admission: RE | Admit: 2017-11-04 | Discharge: 2017-11-04 | Disposition: A | Discharge status: Home / Self Care | Payer: 59 | Source: Ambulatory Visit | Attending: Radiation Oncology | Admitting: Radiation Oncology

## 2017-11-04 ENCOUNTER — Other Ambulatory Visit: Payer: Self-pay | Admitting: Radiation Oncology

## 2017-11-04 ENCOUNTER — Other Ambulatory Visit: Payer: Self-pay | Admitting: *Deleted

## 2017-11-04 DIAGNOSIS — Z510 Encounter for antineoplastic radiation therapy: Secondary | ICD-10-CM | POA: Diagnosis not present

## 2017-11-04 NOTE — Telephone Encounter (Signed)
FAXED [place] [phone]

## 2017-11-05 ENCOUNTER — Ambulatory Visit
Admission: RE | Admit: 2017-11-05 | Discharge: 2017-11-05 | Disposition: A | Discharge status: Home / Self Care | Payer: 59 | Source: Ambulatory Visit | Attending: Radiation Oncology | Admitting: Radiation Oncology

## 2017-11-05 DIAGNOSIS — Z510 Encounter for antineoplastic radiation therapy: Secondary | ICD-10-CM | POA: Diagnosis not present

## 2017-11-08 ENCOUNTER — Ambulatory Visit
Admission: RE | Admit: 2017-11-08 | Discharge: 2017-11-08 | Disposition: A | Discharge status: Home / Self Care | Payer: 59 | Source: Ambulatory Visit | Attending: Radiation Oncology | Admitting: Radiation Oncology

## 2017-11-08 DIAGNOSIS — Z510 Encounter for antineoplastic radiation therapy: Secondary | ICD-10-CM | POA: Diagnosis not present

## 2017-11-09 ENCOUNTER — Ambulatory Visit
Admission: RE | Admit: 2017-11-09 | Discharge: 2017-11-09 | Disposition: A | Discharge status: Home / Self Care | Payer: 59 | Source: Ambulatory Visit | Attending: Radiation Oncology | Admitting: Radiation Oncology

## 2017-11-09 ENCOUNTER — Telehealth: Payer: Self-pay | Admitting: *Deleted

## 2017-11-09 ENCOUNTER — Telehealth: Payer: Self-pay | Admitting: Oncology

## 2017-11-09 ENCOUNTER — Other Ambulatory Visit: Payer: Self-pay | Admitting: Oncology

## 2017-11-09 DIAGNOSIS — C3490 Malignant neoplasm of unspecified part of unspecified bronchus or lung: Secondary | ICD-10-CM

## 2017-11-09 DIAGNOSIS — Z510 Encounter for antineoplastic radiation therapy: Secondary | ICD-10-CM | POA: Diagnosis not present

## 2017-11-09 NOTE — Telephone Encounter (Signed)
CALLED PATIENT'S HUSBAND TO GIVE INFO, LVM FOR A RETURN CALL

## 2017-11-09 NOTE — Discharge Summary (Signed)
Physician Discharge Summary    Diana Schwartz FAO:[id number] DOB: [birth year] DOA: 10/23/2017     PCP: [name], MD     Admit date: 10/23/2017  Discharge date: 10/28/2017     Admitted From: Home  Disposition: Home     Discharge Condition: Stable  CODE STATUS:Full  Diet recommendation: Regular     Brief/Interim Summary:     Patient is a 41 female with past medical history of left invasive lobular carcinoma of the breast diagnosed in 2013, status post bilateral mastectomy with reconstruction.  She was sent by her oncologist to the emergency department to admit her for IV steroids after she was found to have abnormal MRI indicating brain lesions with vasogenic edema,  mild midline shift without evidence of impending herniation and hydrocephalus.  Patient had reported several weeks history of fatigue with intermittent headaches and recently was also noted to have unstable gait for which her oncologist ordered an MRI.  Patient was admitted for IV steroids.  Radiation oncology was consulted.  Patient also has lung mass.  Pulmonary consulted and she underwent biopsy by bronchoscopy on 10/27/17.  Port-A-Cath has also been placed and patient also underwent whole brain/chest radiation simulation.  Patient's overall condition has remained stable.  She does not complain of any gait problems at present .  She was seen by the physical therapy and recommended she does not need any follow-up.  She is stable to be discharged home today.  She will follow-up with radiation oncology and oncology as an outpatient.     Following problems were addressed during her hospitalization:         Brain metastasis with vasogenic edema, mild left midline shift:  Most likely secondary to metastasis from the lung mass.  Cancer type unknown at this point.  Underwent bronchoscopic biopsy on 10/27/17 for tissue sampling.  MRI had shown widespread metastatic disease to the  brain.  Report:Approximately 22 individual metastases are identified ranging from  punctate to 16 mm diameter. Multifocal associated vasogenic edema in the cerebral hemispheres, although there is only trace associated leftward  midline shift and there is no ventriculomegaly or impending herniation. Small C4 vertebral body metastasis suspected. No skull metastasis identified.     Continue dexamethasone PO on discharge.  Continue GI prophylaxis  Port-A-Cath was placed on 10/25/17.  Patient underwent whole brain and chest radiation simulation.She has also been started on radiation therapy  Symptoms of headache has improved.  Biopsy report will be followed.  She will follow-up with radiation -oncology/oncology as an outpatient.     Lung mass: Unknown type.  Biopsy sent.     Unsteady gait/generalized weakness: Much improved.  PT/OT eval to the patient and recommended no follow-up.     History of breast cancer: History of invasive lobar carcinoma.  Status post bilateral mastectomy with reconstruction and tamoxifen for 5 years .She will follow-up with oncology on discharge.     Depression/anxiety: We will continue her home meds, venlafaxine and alprazolam     History of lupus/rheumatoid arthritis/Sjogren's syndrome: On Azothioprine 50 mg po BID and Hydroxychloroquine 200 mg po BID     Autoimmune hepatitis/abnormal LFTs: Improved  today.  We will continue to monitor.  Ultrasound of the right upper quadrant did not show any abnormalities. Hepatitis panel negative.  Patient denies any abdominal pain.  Follow-up liver function tests in a week     Microcytic anemia: Currently H&H is stable.       Moderate malnutrition: Most likely associated  with malignancy.  Nutrition was following.     Hypokalemia/hypocalcemia: Supplemented this morning.  Continue potassium supplementation at home.  Check potassium level in a week as an outpatient     Discharge Diagnoses:   Active  Problems:    Lupus    Autoimmune hepatitis ([place])    Breast cancer ([place])    Depression    Anxiety    Fibromyalgia    Lung mass    Brain edema ([facility])    Malnutrition of moderate degree           Discharge Instructions          Discharge Instructions          Diet - low sodium heart healthy     Complete by:  As directed           Discharge instructions     Complete by:  As directed           1) Follow up with your PCP as soon as possible.  2) Follow up with your oncologist and radiation oncology as an outpatient in the given appointment dates.  3) Check CBC , BMP,LFT(liver function tests) in a week.  4) Take prescribed medications as instructed.        Increase activity slowly     Complete by:  As directed                     Allergies as of 10/28/2017                 Reactions         Oxycodone   Anaphylaxis, Hives, Swelling        Whelps and swelling in throat        Doxycycline   Nausea And Vomiting        Prednisone   Nausea And Vomiting, Diarrhea        Can take IV steroids                        Medication List          TAKE these medications      acetaminophen 500 MG tablet  Commonly known as:  TYLENOL  Take 1,000 mg by mouth every 6 (six) hours as needed.       ALPRAZolam 0.5 MG tablet  Commonly known as:  XANAX  Take 0.5 mg by mouth at bedtime as needed for anxiety.       azaTHIOprine 50 MG tablet  Commonly known as:  IMURAN  Take 50 mg by mouth 2 (two) times daily.       dexamethasone 4 MG tablet  Commonly known as:  DECADRON  Take 1 tablet (4 mg total) by mouth every 6 (six) hours for 15 days.       HM VITAMIN D3 4000 units Caps  Generic drug:  Cholecalciferol  Take by mouth.       hydroxychloroquine 200 MG tablet  Commonly known as:  PLAQUENIL  Take 200 mg by mouth 2 (two) times daily.       pantoprazole 40 MG  tablet  Commonly known as:  PROTONIX  Take 1 tablet (40 mg total) by mouth daily.       venlafaxine XR 150 MG 24 hr capsule  Commonly known as:  EFFEXOR-XR  Take 2 capsules by mouth daily with breakfast.  Follow-up Information            [name], MD. Schedule an appointment as soon as possible for a visit in 1 week(s).     Specialty:  Family Medicine  Contact information:  [address]  [place] [address]  [phone]                                 Allergies    Allergen   Reactions    .   Oxycodone   Anaphylaxis, Hives and Swelling            Whelps and swelling in throat    .   Doxycycline   Nausea And Vomiting    .   Prednisone   Nausea And Vomiting and Diarrhea            Can take IV steroids          Consultations:  .PCCM, radiation oncology         Procedures/Studies:  Imaging Results                                                                                                                              .    (Echo, Carotid, EGD, Colonoscopy, ERCP)         Subjective:     Patient seen and examined the bedside this morning.  Remains comfortable.  No new issues/events.  She has  underwent addition therapy today.  Stable for discharge to home today.     Discharge Exam:       Vitals:        10/27/17 2103   10/28/17 0423    BP:   (!) 153/97   108/80    Pulse:   (!) 56   (!) 59    Resp:   16   18    Temp:   98.2 F (36.8 C)   98.2 F (36.8 C)    SpO2:   96%   99%              Vitals:        10/27/17 1250   10/27/17 1321   10/27/17 2103   10/28/17 0423    BP:   136/70   118/75   (!) 153/97   108/80    Pulse:   (!) 55   (!) 56    (!) 56   (!) 59    Resp:   14       16   18     Temp:       97.7 F (36.5 C)   98.2 F (36.8 C)   98.2 F (36.8 C)    TempSrc:       Oral   Oral   Oral    SpO2:   95%   100%   96%   99%  Weight:                    Height:                          General: Pt is alert, awake, not in acute distress  Cardiovascular: RRR, S1/S2 +, no rubs, no gallops  Respiratory: CTA bilaterally, no wheezing, no rhonchi  Abdominal: Soft, NT, ND, bowel sounds +  Extremities: no edema, no cyanosis               The results of significant diagnostics from this hospitalization (including imaging, microbiology, ancillary and laboratory) are listed below for reference.           Microbiology:  No results found for this or any previous visit (from the past 240 hour(s)).      Labs:  BNP (last 3 results)   Recent Labs (within last 365 days)       Basic Metabolic Panel:  Last Labs                                                                                                                                                                                                                                                                                     Liver Function Tests:  Last Labs  Last Labs         Last Labs        CBC:  Last Labs                                                                                                                                                                      Cardiac Enzymes:   Last Labs        BNP:   Last Labs        CBG:  Last Labs                                                                      D-Dimer   Recent Labs (last 2 labs)        Hgb A1c   Recent Labs (last 2 labs)        Lipid Profile   Recent Labs (last 2 labs)        Thyroid function studies    Recent Labs (last 2 labs)             Anemia work up   [employer] (last 2 labs)        Urinalysis  Labs (Brief)                                                                                                                                                                                         Sepsis Labs   Last Labs        Microbiology  No results found for this or any previous visit (from the past 240 hour(s)).        Time coordinating discharge:  Over 30 minutes     SIGNED:        Marene Lenz, MD              [facility]  10/28/2017, 5:36 PM  Pager [phone]     If 7PM-7AM, please contact night-coverage  [email or web address]  Password [email or web address]

## 2017-11-09 NOTE — Telephone Encounter (Signed)
Tried to call regarding voicemail  °

## 2017-11-10 ENCOUNTER — Inpatient Hospital Stay: Payer: 59

## 2017-11-10 ENCOUNTER — Inpatient Hospital Stay (HOSPITAL_BASED_OUTPATIENT_CLINIC_OR_DEPARTMENT_OTHER): Payer: 59 | Admitting: Oncology

## 2017-11-10 ENCOUNTER — Other Ambulatory Visit: Payer: Self-pay | Admitting: Internal Medicine

## 2017-11-10 ENCOUNTER — Encounter: Payer: Self-pay | Admitting: Oncology

## 2017-11-10 ENCOUNTER — Ambulatory Visit
Admission: RE | Admit: 2017-11-10 | Discharge: 2017-11-10 | Disposition: A | Discharge status: Home / Self Care | Payer: 59 | Source: Ambulatory Visit | Attending: Radiation Oncology | Admitting: Radiation Oncology

## 2017-11-10 ENCOUNTER — Encounter (HOSPITAL_COMMUNITY): Payer: Self-pay | Admitting: Internal Medicine

## 2017-11-10 ENCOUNTER — Encounter: Payer: Self-pay | Admitting: Radiation Oncology

## 2017-11-10 ENCOUNTER — Telehealth: Payer: Self-pay | Admitting: Internal Medicine

## 2017-11-10 ENCOUNTER — Other Ambulatory Visit: Payer: 59

## 2017-11-10 VITALS — BP 88/69 | HR 89 | Temp 98.5°F | Resp 20 | Ht 62.0 in | Wt 122.1 lb

## 2017-11-10 DIAGNOSIS — Z9013 Acquired absence of bilateral breasts and nipples: Secondary | ICD-10-CM | POA: Diagnosis not present

## 2017-11-10 DIAGNOSIS — R110 Nausea: Secondary | ICD-10-CM

## 2017-11-10 DIAGNOSIS — Z5111 Encounter for antineoplastic chemotherapy: Secondary | ICD-10-CM

## 2017-11-10 DIAGNOSIS — C771 Secondary and unspecified malignant neoplasm of intrathoracic lymph nodes: Secondary | ICD-10-CM

## 2017-11-10 DIAGNOSIS — Z923 Personal history of irradiation: Secondary | ICD-10-CM | POA: Diagnosis not present

## 2017-11-10 DIAGNOSIS — C7931 Secondary malignant neoplasm of brain: Secondary | ICD-10-CM

## 2017-11-10 DIAGNOSIS — C3490 Malignant neoplasm of unspecified part of unspecified bronchus or lung: Secondary | ICD-10-CM

## 2017-11-10 DIAGNOSIS — Z79899 Other long term (current) drug therapy: Secondary | ICD-10-CM

## 2017-11-10 DIAGNOSIS — Z170 Estrogen receptor positive status [ER+]: Secondary | ICD-10-CM

## 2017-11-10 DIAGNOSIS — Z9223 Personal history of estrogen therapy: Secondary | ICD-10-CM | POA: Diagnosis not present

## 2017-11-10 DIAGNOSIS — Z853 Personal history of malignant neoplasm of breast: Secondary | ICD-10-CM | POA: Diagnosis not present

## 2017-11-10 DIAGNOSIS — C7951 Secondary malignant neoplasm of bone: Secondary | ICD-10-CM

## 2017-11-10 DIAGNOSIS — C342 Malignant neoplasm of middle lobe, bronchus or lung: Secondary | ICD-10-CM | POA: Diagnosis not present

## 2017-11-10 DIAGNOSIS — Z7189 Other specified counseling: Secondary | ICD-10-CM

## 2017-11-10 DIAGNOSIS — Z510 Encounter for antineoplastic radiation therapy: Secondary | ICD-10-CM | POA: Diagnosis not present

## 2017-11-10 LAB — CBC WITH DIFFERENTIAL (CANCER CENTER ONLY)
Basophils Absolute: 0 10*3/uL (ref 0.0–0.1)
Basophils Relative: 0 %
EOS ABS: 0 10*3/uL (ref 0.0–0.5)
EOS PCT: 0 %
HCT: 36.4 % (ref 34.8–46.6)
Hemoglobin: 12.3 g/dL (ref 11.6–15.9)
LYMPHS ABS: 0.5 10*3/uL — AB (ref 0.9–3.3)
Lymphocytes Relative: 6 %
MCH: 34 pg (ref 25.1–34.0)
MCHC: 33.8 g/dL (ref 31.5–36.0)
MCV: 100.6 fL (ref 79.5–101.0)
MONO ABS: 0.3 10*3/uL (ref 0.1–0.9)
Monocytes Relative: 4 %
Neutro Abs: 7.3 10*3/uL — ABNORMAL HIGH (ref 1.5–6.5)
Neutrophils Relative %: 90 %
PLATELETS: 250 10*3/uL (ref 145–400)
RBC: 3.62 MIL/uL — AB (ref 3.70–5.45)
RDW: 15.3 % — AB (ref 11.2–14.5)
WBC: 8.2 10*3/uL (ref 3.9–10.3)

## 2017-11-10 LAB — CMP (CANCER CENTER ONLY)
ALT: 23 U/L (ref 0–55)
AST: 15 U/L (ref 5–34)
Albumin: 2.7 g/dL — ABNORMAL LOW (ref 3.5–5.0)
Alkaline Phosphatase: 85 U/L (ref 40–150)
Anion gap: 9 (ref 3–11)
BUN: 15 mg/dL (ref 7–26)
CHLORIDE: 102 mmol/L (ref 98–109)
CO2: 25 mmol/L (ref 22–29)
CREATININE: 0.58 mg/dL — AB (ref 0.60–1.10)
Calcium: 8.8 mg/dL (ref 8.4–10.4)
Glucose, Bld: 112 mg/dL (ref 70–140)
POTASSIUM: 4.1 mmol/L (ref 3.5–5.1)
SODIUM: 136 mmol/L (ref 136–145)
Total Bilirubin: <0.2 mg/dL — ABNORMAL LOW (ref 0.2–1.2)
Total Protein: 6.5 g/dL (ref 6.4–8.3)

## 2017-11-10 MED ORDER — PROCHLORPERAZINE MALEATE 10 MG PO TABS: 10.0000 mg | ORAL_TABLET | Freq: Four times a day (QID) | ORAL | 1 refills | Status: DC | PRN

## 2017-11-10 MED ORDER — CYANOCOBALAMIN 1000 MCG/ML IJ SOLN
1000.0000 ug | Freq: Once | INTRAMUSCULAR | Status: AC
  Administered 2017-11-10: 1000 ug via INTRAMUSCULAR

## 2017-11-10 MED ORDER — LIDOCAINE-PRILOCAINE 2.5-2.5 % EX CREA: 1.0000 "application " | TOPICAL_CREAM | CUTANEOUS | 0 refills | Status: AC | PRN

## 2017-11-10 MED ORDER — DEXAMETHASONE 4 MG PO TABS: ORAL_TABLET | ORAL | 1 refills | Status: AC

## 2017-11-10 MED ORDER — FOLIC ACID 1 MG PO TABS: 1.0000 mg | ORAL_TABLET | Freq: Every day | ORAL | 2 refills | Status: DC

## 2017-11-10 MED ORDER — CYANOCOBALAMIN 1000 MCG/ML IJ SOLN
INTRAMUSCULAR | Status: AC
  Filled 2017-11-10: qty 1

## 2017-11-10 NOTE — Progress Notes (Signed)
[facility] OFFICE PROGRESS NOTE  [name], MD [place] [address]  DIAGNOSIS: Stage IV non-small cell lung cancer, adenocarcinoma presenting with a left middle lobe mass that abuts the mediastinal border/pericardium and the pleural surface.  The patient was found to have adrenal and bone metastasis and over 20 brain metastases.  PD-L1 50%  PRIOR THERAPY: The patient completed whole brain radiation and palliative radiation to the left lung on 11/10/2017.  CURRENT THERAPY: Systemic chemotherapy with carboplatin for an AUC of 5, Alimta 500 mg meter squared, and Avastin 15 mg/kg every 3 weeks.  First dose expected on 11/23/2017.  INTERVAL HISTORY: Diana Schwartz 52 y.o. female returns for routine follow-up visit accompanied by her husband and daughter.  Patient was hospitalized recently due to new findings of over 20 brain metastases.  She received radiation to the brain.  She also received radiation to her left lung mass.  She completed her radiation earlier today.  She was recently treated for thrush with nystatin which is improving.  She is also been seen by Dr. Mickeal Skinner due to new onset of focal seizures.  She is currently on Keppra 1000 mg twice a day remains on dexamethasone 4 mg twice a day.  The patient states that she still has seizures, but not as bad.  She does have an aura with the smell of something burning and episodes of her left arm shaking.  The patient reports fatigue.  The patient denies fevers and chills.  Denies chest pain, shortness of breath, cough, hemoptysis.  Denies nausea, vomiting, constipation, diarrhea.  She has not had any recent weight loss or night sweats.  The patient is here for evaluation and discussion of her treatment options.  MEDICAL HISTORY: Past Medical History:  Diagnosis Date  . Anxiety   . Autoimmune hepatitis ([place])    no current med.  . Breast cancer [facility]) 2013   Left breast cancer. Treated at [facility]. Per  notes, nvasive lobular carcinoma. S/P bilateral mastectomy with recontruction and Tamoxifen X5 years.   . Carpal tunnel syndrome of left wrist   . Degenerative disc disease, lumbar   . Depression   . Fibromyalgia   . Lupus (systemic lupus erythematosus) ([place])   . Osteoarthritis   . Rash    arms, chest - due to lupus  . Raynaud disease   . Rheumatoid arthritis ([place])   . Sjoegren syndrome ([facility])     ALLERGIES:  is allergic to oxycodone; doxycycline; and prednisone.  MEDICATIONS:  Current Outpatient Medications  Medication Sig Dispense Refill  . acetaminophen (TYLENOL) 500 MG tablet Take 1,000 mg by mouth every 6 (six) hours as needed.    . ALPRAZolam (XANAX) 0.5 MG tablet Take 0.5 mg by mouth at bedtime as needed for anxiety.    Marland Kitchen azaTHIOprine (IMURAN) 50 MG tablet Take 50 mg by mouth 2 (two) times daily.    . Cholecalciferol (HM VITAMIN D3) 4000 units CAPS Take by mouth.    . dexamethasone (DECADRON) 4 MG tablet Take 1 tablet (4 mg total) by mouth every 6 (six) hours for 15 days. 60 tablet 0  . folic acid (FOLVITE) 1 MG tablet Take 1 tablet (1 mg total) by mouth daily. 30 tablet 2  . HYDROcodone-acetaminophen (NORCO/VICODIN) 5-325 MG tablet     . hydroxychloroquine (PLAQUENIL) 200 MG tablet Take 200 mg by mouth 2 (two) times daily.    Marland Kitchen levETIRAcetam (KEPPRA) 500 MG tablet Take 1 tablet (500 mg total)  by mouth 2 (two) times daily. 60 tablet 5  . lidocaine-prilocaine (EMLA) cream Apply 1 application topically as needed. 30 g 0  . nystatin (MYCOSTATIN) 100000 UNIT/ML suspension     . omeprazole (PRILOSEC) 40 MG capsule Take by mouth daily.  3  . pantoprazole (PROTONIX) 40 MG tablet Take 1 tablet (40 mg total) by mouth daily. 30 tablet 0  . prochlorperazine (COMPAZINE) 10 MG tablet Take 1 tablet (10 mg total) by mouth every 6 (six) hours as needed for nausea or vomiting. 30 tablet 1  . venlafaxine XR (EFFEXOR-XR) 150 MG 24 hr capsule Take 2 capsules by mouth daily with breakfast.     .  dexamethasone (DECADRON) 4 MG tablet Take 1 tablet twice a day the day before, day of, and day after each cycle of chemotherapy. 30 tablet 1   No current facility-administered medications for this visit.     SURGICAL HISTORY:  Past Surgical History:  Procedure Laterality Date  . ANTERIOR CERVICAL DECOMP/DISCECTOMY FUSION  02/06/2004   C5-6, C6-7  . AXILLARY NODE DISSECTION Left 08/2012  . BREAST RECONSTRUCTION  06/2013  . CARPAL TUNNEL RELEASE Right 1989  . CARPAL TUNNEL RELEASE Left 10/02/2013   Procedure: LEFT CARPAL TUNNEL RELEASE;  Surgeon: Linna Hoff, MD;  Location: [facility];  Service: Orthopedics;  Laterality: Left;  Local Anesthesia with IV Sedation  . ENDOBRONCHIAL ULTRASOUND Bilateral 10/27/2017   Procedure: ENDOBRONCHIAL ULTRASOUND;  Surgeon: Marshell Garfinkel, MD;  Location: [facility] ENDOS[facility];  Service: Cardiopulmonary;  Laterality: Bilateral;  . FOOT SURGERY Right    bunion and broke toe and then realign toe  . IR FLUORO GUIDE PORT INSERTION RIGHT  10/25/2017  . IR US GUIDE VASC ACCESS RIGHT  10/25/2017  . MASTECTOMY Bilateral 08/12/2012   LEFT BREAST CANCER  . thoracic back surgery  05/2015  . VAGINAL HYSTERECTOMY  1999    REVIEW OF SYSTEMS:   Review of Systems  Constitutional: Negative for appetite change, chills, fever and unexpected weight change. Positive for fatigue. HENT:   Negative for mouth sores, nosebleeds, sore throat and trouble swallowing.   Eyes: Negative for eye problems and icterus.  Respiratory: Negative for cough, hemoptysis, shortness of breath and wheezing.   Cardiovascular: Negative for chest pain and leg swelling.  Gastrointestinal: Negative for abdominal pain, constipation, diarrhea, nausea and vomiting.  Genitourinary: Negative for bladder incontinence, difficulty urinating, dysuria, frequency and hematuria.   Musculoskeletal: Negative for back pain, gait problem, neck pain and neck stiffness.  Skin: Negative for itching and rash.   Neurological: Negative for dizziness, extremity weakness, headaches, light-headedness.  Positive for focal seizures.   Hematological: Negative for adenopathy. Does not bruise/bleed easily.  Psychiatric/Behavioral: Negative for confusion, depression and sleep disturbance. The patient is not nervous/anxious.     PHYSICAL EXAMINATION:  Blood pressure (!) 88/69, pulse 89, temperature 98.5 F (36.9 C), temperature source Oral, resp. rate 20, height 5' 2"  (1.575 m), weight 122 lb 1.6 oz (55.4 kg), SpO2 100 %.  ECOG PERFORMANCE STATUS: 1 - Symptomatic but completely ambulatory  Physical Exam  Constitutional: Oriented to person, place, and time and well-developed, well-nourished, and in no distress. No distress.  HENT:  Head: Normocephalic and atraumatic.  Mouth/Throat: Oropharynx is clear and moist. No oropharyngeal exudate.  Eyes: Conjunctivae are normal. Right eye exhibits no discharge. Left eye exhibits no discharge. No scleral icterus.  Neck: Normal range of motion. Neck supple.  Cardiovascular: Normal rate, regular rhythm, normal heart sounds and intact distal pulses.  Pulmonary/Chest: Effort normal and breath sounds normal. No respiratory distress. No wheezes. No rales.  Abdominal: Soft. Bowel sounds are normal. Exhibits no distension and no mass. There is no tenderness.  Musculoskeletal: Normal range of motion. Exhibits no edema.  Lymphadenopathy:    No cervical adenopathy.  Neurological: Alert and oriented to person, place, and time. Exhibits normal muscle tone. Coordination normal.  Skin: Skin is warm and dry. No rash noted. Not diaphoretic. No erythema. No pallor.  Psychiatric: Mood, memory and judgment normal.  Vitals reviewed.  LABORATORY DATA: Lab Results  Component Value Date   WBC 8.2 11/10/2017   HGB 11.3 (L) 10/27/2017   HCT 36.4 11/10/2017   MCV 100.6 11/10/2017   PLT 250 11/10/2017      Chemistry      Component Value Date/Time   NA 136 11/10/2017 1338   K 4.1  11/10/2017 1338   CL 102 11/10/2017 1338   CO2 25 11/10/2017 1338   BUN 15 11/10/2017 1338   CREATININE 0.58 (L) 11/10/2017 1338      Component Value Date/Time   CALCIUM 8.8 11/10/2017 1338   ALKPHOS 85 11/10/2017 1338   AST 15 11/10/2017 1338   ALT 23 11/10/2017 1338   BILITOT <0.2 (L) 11/10/2017 1338       RADIOGRAPHIC STUDIES:  Ct Chest Wo Contrast  Addendum Date: 10/12/2017   ADDENDUM REPORT: 10/12/2017 21:51 ADDENDUM: These results will be called to the ordering clinician or representative by the Radiologist Assistant, and communication documented in the PACS or zVision Dashboard. Electronically Signed   By: Fidela Salisbury M.D.   On: 10/12/2017 21:51   Result Date: 10/12/2017 CLINICAL DATA:  Evaluate pneumonia post multiple course of antibiotics. EXAM: CT CHEST WITHOUT CONTRAST TECHNIQUE: Multidetector CT imaging of the chest was performed following the standard protocol without IV contrast. COMPARISON:  Chest x-ray 10/06/2017 history of left breast cancer. FINDINGS: Cardiovascular: No significant vascular findings. Normal heart size. No pericardial effusion. Mediastinum/Nodes: Mediastinal lymphadenopathy with lymph nodes measuring up to 19 mm in short axis. Bulky left hilar lymphadenopathy. Normal trachea and esophagus. 1.5 cm hypoattenuated mass within the right lobe of the thyroid gland. Lungs/Pleura: Mild upper lobe predominant emphysematous changes. Bilobed low-attenuation soft tissue mass in the lingula measures 8.4 by 5.6 by 5.5 cm and abuts the pericardium and anterior mediastinum medially and the pleural surface laterally. There is probable extension along the pleural surface. Surrounding interstitial thickening suspicious for lymphangitic spread of tumor. Small left pleural effusion. Several less than 5 mm pulmonary nodules in the right upper lobe. Upper Abdomen: No acute abnormality. Musculoskeletal: Lower thoracic and lower cervical fusion with intact hardware. No evidence  of suspicious lesions. Post bilateral mastectomy and implant reconstruction. Post left axillary lymph node dissection. IMPRESSION: Left middle lobe heterogeneous possibly necrotic soft tissue mass, measuring up to 8.4 cm, highly suspicious for malignancy. The mass abuts the mediastinal border/pericardium medially and the pleural surface laterally. Suspected pleural extension of tumor, as well as lymphangitic spread of malignancy in the rest of the lung parenchyma of the lingula and portions of the left upper lobe. Tissue diagnosis and PET-CT may be considered. Bulky left hilar lymphadenopathy and mediastinal lymphadenopathy. Few less than 5 mm solid and ground-glass nodules in the right upper lobe with indeterminate significance. 1.5 cm right thyroid mass. Post bilateral mastectomy with implant reconstruction. Post left axillary lymph node dissection. Determined left axillary lymph nodes, which could be further evaluated with focused axillary ultrasound. Emphysema (ICD10-J43.9). Electronically Signed: By: Thomas Hoff  Dimitrova M.D. On: 10/12/2017 20:20   Mr Jeri Cos TG Contrast  Addendum Date: 10/23/2017   ADDENDUM REPORT: 10/23/2017 12:32 ADDENDUM: Study discussed by telephone with Dr. Irene Limbo, who is on-call for provider Diana Schwartz, on 10/23/2017 at 1227 hours. Electronically Signed   By: Genevie Ann M.D.   On: 10/23/2017 12:32   Result Date: 10/23/2017 CLINICAL DATA:  52 year old female with metastatic bronchogenic carcinoma diagnosed recently on PET-CT. Abnormal CT appearance of the right hemisphere on the recent PET images. Headaches. Staging. EXAM: MRI HEAD WITHOUT AND WITH CONTRAST TECHNIQUE: Multiplanar, multiecho pulse sequences of the brain and surrounding structures were obtained without and with intravenous contrast. CONTRAST:  26m MULTIHANCE GADOBENATE DIMEGLUMINE 529 MG/ML IV SOLN COMPARISON:  PET-CT 10/19/2017.  Cervical spine MRI 12/09/2003. FINDINGS: Brain: Numerous enhancing brain metastases scattered  in both hemispheres and the right cerebellum. Approximately 21 individual metastases are identified, ranging from punctate to 16 millimeters diameter. All lesions are annotated on series 11. The largest lesions are surrounded by vasogenic edema, and/or in the anterior superior right frontal lobe, posterior left frontal and parietal lobe, anterior inferior right frontal lobe, and several adjacent lesions in the right temporal lobe (including the largest on series 11, image 22. There is trace leftward midline shift. Basilar cisterns remain widely patent. There is mild mass effect on the lateral ventricles with no ventriculomegaly. None of the metastasis appear to be hemorrhagic. Many demonstrate restricted diffusion compatible with hyper cellularity. No associated leptomeningeal or other dural thickening. Scattered superimposed small nonenhancing nonspecific cerebral white matter T2 and FLAIR hyperintense foci. Mild patchy nonspecific T2 hyperintensity in the pons. No extra-axial collection or acute intracranial hemorrhage. Cervicomedullary junction and pituitary are within normal limits. Vascular: Major intracranial vascular flow voids are preserved. The major dural venous sinuses are enhancing and appear patent. Skull and upper cervical spine: Cervical spine degeneration but superimposed suspicious marrow signal in the anterior C4 vertebral body, which seems to correspond to abnormal PET activity on 10/19/2017 (series 3, image 12 today). Negative visible cervical spinal cord. No suspicious calvarium marrow signal. Sinuses/Orbits: Orbit soft tissues are within normal limits sinus mucosal thickening. There is moderate widespread paranasal. Other: Visible internal auditory structures appear normal. Mastoid air cells are clear. Trace retained secretions in the nasopharynx. Scalp and face soft tissues appear negative. IMPRESSION: 1. Positive for widespread metastatic disease to the brain. Approximately 22 individual  metastases are identified ranging from punctate to 16 mm diameter. 2. Multifocal associated vasogenic edema in the cerebral hemispheres, although there is only trace associated leftward midline shift and there is no ventriculomegaly or impending herniation. 3. Small C4 vertebral body metastasis suspected. No skull metastasis identified. Electronically Signed: By: HGenevie AnnM.D. On: 10/23/2017 11:34   Nm Pet Image Initial (pi) Skull Base To Thigh  Result Date: 10/20/2017 CLINICAL DATA:  Initial treatment strategy for history of breast cancer. Left upper lobe/lingular mass. EXAM: NUCLEAR MEDICINE PET SKULL BASE TO THIGH TECHNIQUE: 5.9 mCi F-18 FDG was injected intravenously. Full-ring PET imaging was performed from the skull base to thigh after the radiotracer. CT data was obtained and used for attenuation correction and anatomic localization. FASTING BLOOD GLUCOSE:  Value: 100 mg/dl COMPARISON:  Chest CT of 10/12/2017.  Abdominal CT of 08/13/2017. FINDINGS: Mediastinal blood pool activity: SUV max equal 2.2 NECK: Low left jugular/supraclavicular tiny node corresponds to hypermetabolism. This measures 4 mm and a S.U.V. max of 3.4 on image 39/4. Incidental CT findings: No cervical adenopathy. Right frontal and temporal lobe hypoattenuation,  including on image 1/4. Suboptimally evaluated. Nonspecific right thyroid hypoattenuating nodule of 10 mm. CHEST: Centrally necrotic lingular primary measures on the order of 5.8 x 5.7 cm and a S.U.V. max of 13.9 on image 35/8. Surrounding ground-glass and airspace disease is favored to represent postobstructive pneumonitis versus lymphangitic tumor spread. Tiny left pleural effusion demonstrates nonspecific low-level hypermetabolism. Bilateral mediastinal nodal metastasis, including a right paratracheal node which measures 1.9 cm and a S.U.V. max of 18.1 on image 62/4. Incidental CT findings: Bilateral breast implants. Left axillary node dissection. Nonspecific bilateral  pulmonary nodules, below PET resolution. ABDOMEN/PELVIS: Hypermetabolic left adrenal nodule measures 2.4 cm and a S.U.V. max of 11.5 on image 107/4. No other abnormal soft tissue hypermetabolism identified. Incidental CT findings: Abdominal aortic atherosclerosis. Hysterectomy. SKELETON: Left humeral head lesion measures 8 mm and a S.U.V. max of 7.1 on image 38/4. A left acetabular lesion is CT occult and measures a S.U.V. max of 5.9. Incidental CT findings: Lumbar spine fixation. Cervical spine fixation. IMPRESSION: 1. Hypermetabolic lingular mass with cervical/thoracic nodal, left adrenal, and bone metastasis. Most consistent with primary bronchogenic carcinoma. 2. Tiny left pleural effusion with nonspecific low-level hypermetabolism. 3. Postobstructive pneumonitis and/or lymphangitic tumor spread more peripheral within the lingula. 4. Hypoattenuation within the right frontal and temporal lobes is suboptimally evaluated. 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 If not previously evaluated, consider further evaluation with pre and post-contrast brain MR. 5.  Aortic Atherosclerosis (ICD10-I70.0). Electronically Signed   By: Abigail Miyamoto M.D.   On: 10/20/2017 08:43   Ir US Guide Vasc Access Right  Result Date: 10/25/2017 INDICATION: History of recurrent metastatic breast cancer. In need of durable intravenous access for chemotherapy administration. EXAM: IMPLANTED PORT A CATH PLACEMENT WITH ULTRASOUND AND FLUOROSCOPIC GUIDANCE COMPARISON:  PET-CT - 10/19/2017 MEDICATIONS: Ancef 2 gm IV; The antibiotic was administered within an appropriate time interval prior to skin puncture. ANESTHESIA/SEDATION: Moderate (conscious) sedation was employed during this procedure. A total of Versed 2 mg and Fentanyl 100 mcg was administered intravenously. Moderate Sedation Time: 24 minutes. The patient's level of consciousness and vital signs were monitored continuously by radiology nursing throughout the procedure under my  direct supervision. CONTRAST:  None FLUOROSCOPY TIME:  18 seconds (3 mGy) COMPLICATIONS: None immediate. PROCEDURE: The procedure, risks, benefits, and alternatives were explained to the patient. Questions regarding the procedure were encouraged and answered. The patient understands and consents to the procedure. The right neck and chest were prepped with chlorhexidine in a sterile fashion, and a sterile drape was applied covering the operative field. Maximum barrier sterile technique with sterile gowns and gloves were used for the procedure. A timeout was performed prior to the initiation of the procedure. Local anesthesia was provided with 1% lidocaine with epinephrine. After creating a small venotomy incision, a micropuncture kit was utilized to access the internal jugular vein. Real-time ultrasound guidance was utilized for vascular access including the acquisition of a permanent ultrasound image documenting patency of the accessed vessel. The microwire was utilized to measure appropriate catheter length. A subcutaneous port pocket was then created along the upper chest wall utilizing a combination of sharp and blunt dissection. The pocket was irrigated with sterile saline. A single lumen thin power injectable port was chosen for placement. The 8 Fr catheter was tunneled from the port pocket site to the venotomy incision. The port was placed in the pocket. The external catheter was trimmed to appropriate length. At the venotomy, an 8 Fr peel-away sheath was placed over a guidewire  under fluoroscopic guidance. The catheter was then placed through the sheath and the sheath was removed. Final catheter positioning was confirmed and documented with a fluoroscopic spot radiograph. The port was accessed with a Huber needle, aspirated and flushed. The venotomy site was closed with an interrupted 4-0 Vicryl suture. The port pocket incision was closed with interrupted 2-0 Vicryl suture and the skin was opposed with a  running subcuticular 4-0 Vicryl suture. Dermabond and Steri-strips were applied to both incisions. Port a Catheter was left accessed for inpatient hospitalization. Dressings were placed. The patient tolerated the procedure well without immediate post procedural complication. FINDINGS: After catheter placement, the tip lies within the superior cavoatrial junction. The catheter aspirates and flushes normally and is ready for immediate use. IMPRESSION: Successful placement of a right internal jugular approach power injectable Port-A-Cath. Port a Catheter was left accessed and is ready for immediate use. Electronically Signed   By: Sandi Mariscal M.D.   On: 10/25/2017 17:30   Dg Chest Port 1 View  Result Date: 10/27/2017 CLINICAL DATA:  Status post bronchoscopy EXAM: PORTABLE CHEST 1 VIEW COMPARISON:  10/06/2017 FINDINGS: Persistent lingular airspace opacity. Interval biopsy. No pleural effusion or pneumothorax. Right lung is clear. Stable cardiomediastinal silhouette. Right-sided Port-A-Cath in satisfactory position. No acute osseous abnormality. Surgical clips in the left axilla again noted. IMPRESSION: 1. Persistent lingular airspace opacity with interval biopsy. No pneumothorax. Electronically Signed   By: Kathreen Devoid   On: 10/27/2017 12:43   Ir Cyndy Freeze Guide Port Insertion Right  Result Date: 10/25/2017 INDICATION: History of recurrent metastatic breast cancer. In need of durable intravenous access for chemotherapy administration. EXAM: IMPLANTED PORT A CATH PLACEMENT WITH ULTRASOUND AND FLUOROSCOPIC GUIDANCE COMPARISON:  PET-CT - 10/19/2017 MEDICATIONS: Ancef 2 gm IV; The antibiotic was administered within an appropriate time interval prior to skin puncture. ANESTHESIA/SEDATION: Moderate (conscious) sedation was employed during this procedure. A total of Versed 2 mg and Fentanyl 100 mcg was administered intravenously. Moderate Sedation Time: 24 minutes. The patient's level of consciousness and vital signs were  monitored continuously by radiology nursing throughout the procedure under my direct supervision. CONTRAST:  None FLUOROSCOPY TIME:  18 seconds (3 mGy) COMPLICATIONS: None immediate. PROCEDURE: The procedure, risks, benefits, and alternatives were explained to the patient. Questions regarding the procedure were encouraged and answered. The patient understands and consents to the procedure. The right neck and chest were prepped with chlorhexidine in a sterile fashion, and a sterile drape was applied covering the operative field. Maximum barrier sterile technique with sterile gowns and gloves were used for the procedure. A timeout was performed prior to the initiation of the procedure. Local anesthesia was provided with 1% lidocaine with epinephrine. After creating a small venotomy incision, a micropuncture kit was utilized to access the internal jugular vein. Real-time ultrasound guidance was utilized for vascular access including the acquisition of a permanent ultrasound image documenting patency of the accessed vessel. The microwire was utilized to measure appropriate catheter length. A subcutaneous port pocket was then created along the upper chest wall utilizing a combination of sharp and blunt dissection. The pocket was irrigated with sterile saline. A single lumen thin power injectable port was chosen for placement. The 8 Fr catheter was tunneled from the port pocket site to the venotomy incision. The port was placed in the pocket. The external catheter was trimmed to appropriate length. At the venotomy, an 8 Fr peel-away sheath was placed over a guidewire under fluoroscopic guidance. The catheter was then placed through the  sheath and the sheath was removed. Final catheter positioning was confirmed and documented with a fluoroscopic spot radiograph. The port was accessed with a Huber needle, aspirated and flushed. The venotomy site was closed with an interrupted 4-0 Vicryl suture. The port pocket incision was  closed with interrupted 2-0 Vicryl suture and the skin was opposed with a running subcuticular 4-0 Vicryl suture. Dermabond and Steri-strips were applied to both incisions. Port a Catheter was left accessed for inpatient hospitalization. Dressings were placed. The patient tolerated the procedure well without immediate post procedural complication. FINDINGS: After catheter placement, the tip lies within the superior cavoatrial junction. The catheter aspirates and flushes normally and is ready for immediate use. IMPRESSION: Successful placement of a right internal jugular approach power injectable Port-A-Cath. Port a Catheter was left accessed and is ready for immediate use. Electronically Signed   By: Sandi Mariscal M.D.   On: 10/25/2017 17:30   US Abdomen Limited Ruq  Result Date: 10/27/2017 CLINICAL DATA:  Elevated liver function tests. Breast cancer patient. EXAM: ULTRASOUND ABDOMEN LIMITED RIGHT UPPER QUADRANT COMPARISON:  PET scan 10/19/2017 FINDINGS: Gallbladder: No gallstones or wall thickening visualized. No sonographic Murphy sign noted by sonographer. Common bile duct: Diameter: 2.7 mm common normal Liver: No focal lesion identified. Within normal limits in parenchymal echogenicity. Portal vein is patent on color Doppler imaging with normal direction of blood flow towards the liver. IMPRESSION: Normal right upper quadrant ultrasound. No abnormality seen to explain abnormal lab results. Electronically Signed   By: Nelson Chimes M.D.   On: 10/27/2017 10:02     ASSESSMENT/PLAN:  Adenocarcinoma of lung, stage 4 (HCC) This is a very pleasant 52 year old white female with stage IV non-small cell lung cancer, adenocarcinoma presenting with a left middle lobe mass that abuts the mediastinal border/pericardium and the pleural surface.  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 The patient completed radiation to her brain and left lung mass earlier today. She is here for  evaluation and to discuss her treatment options.  The patient was seen with Dr. Julien Nordmann.  He had a lengthy discussion with the patient and her family regarding her diagnosis, prognosis, and treatment options.  The patient was advised that her condition is not curable.  We discussed that her PDL 1 is 50%, but that molecular testing is pending.  Additionally, we have sent her blood work to test for EGFR.  Treatment options were discussed including palliative care/hospice versus treatment with a targeted therapy if she has an  actionable mutation versus systemic chemotherapy.  If the patient is found to have an actionable mutation, we will plan to start the patient on targeted therapy.  However, if there is no actionable mutation, recommend treatment with systemic chemotherapy.  Given the patient's history of lupus, rheumatoid arthritis, and autoimmune hepatitis, immunotherapy will not be utilized.   Systemic chemotherapy would consist of carboplatin for an AUC of 5, Alimta 500 mg meter squared, and Avastin 15 mg/kg every 3 weeks. Discussed with the patient adverse effects of this treatment including but not limited to alopecia, myelosuppression, nausea and vomiting, peripheral neuropathy, liver or renal dysfunction in addition to the adverse effect of Avastin including but not limited to pulmonary hemorrhage, GI perforation, wound healing delay as well as hypertension and proteinuria. We will arrange for the patient to receive vitamin B 12 injection today. The patient will also receive prescription for Compazine 10 mg by mouth every 6 hours as needed for nausea, Decadron  4 mg by mouth twice a day, the day before, day of and day after the chemotherapy in addition to folic acid 1 mg by mouth daily. A prescription for EMLA cream was sent to her pharmacy. The patient have a chemotherapy education class. Anticipate the first dose of chemotherapy on 11/23/2017.  She will be seen back for follow-up on that date for  evaluation and repeat lab work and to discuss her EGFR results and molecular testing. The patient will have weekly labs while on chemotherapy.  Referral has been made to social work per the patient request due to concerns over finances.  The patient was advised to call immediately if she has any concerning symptoms in the interval.  The patient voices understanding of her current disease status and treatment options and is in agreement with the current care plan.  All questions were answered.  The patient knows to call the clinic with any problems, questions, or concerns.  We can certainly see the patient much sooner if necessary.    Orders Placed This Encounter  Procedures  . CBC with Differential (Cancer Center Only)    Standing Status:   Standing    Number of Occurrences:   20    Standing Expiration Date:   11/11/2018  . CMP ([facility] only)    Standing Status:   Standing    Number of Occurrences:   20    Standing Expiration Date:   11/11/2018  . Total Protein, Urine dipstick    Standing Status:   Future    Standing Expiration Date:   11/11/2018  . Ambulatory referral to Social Work    Referral Priority:   Routine    Referral Type:   Consultation    Referral Reason:   Specialty Services Required    Number of Visits Requested:   1   Diana Schwartz, [place], AGPCNP-BC, AOCNP 11/10/17   ADDENDUM: Hematology/Oncology Attending: I had a face-to-face encounter with the patient.  I recommended her care plan.  This is a very pleasant 52 years old white female recently diagnosed with a stage IV non-small cell lung cancer, adenocarcinoma, presented with large left lung mass in addition to mediastinal lymphadenopathy as well as metastatic disease to the bone and brain.  The patient recently underwent a course of palliative radiotherapy to the brain as well as the lung mass.  She completed the course of palliative radiotherapy today. She is feeling much better.  She is still on a taper dose  of Decadron. I had a lengthy discussion with the patient and her family about her current disease of stage, prognosis and treatment options. I recommended for the patient to send the tissue block for molecular studies and PDL 1 expression.  The results are expected to come back in 1 more week. If the patient has no actionable mutation, I would consider her for systemic chemotherapy with carboplatin for AUC of 5, Alimta 500 mg/M2 and Avastin 15 mg/KG every 3 weeks.  The patient will not be a good candidate for front line treatment with immunotherapy because of the significant history of lupus.  Her PDL 1 expression is 50%.  We also discussed with the patient the option of palliative care and hospice referral but she is interested in treatment. She is expected to start the first cycle of this treatment in less than 2 weeks if there is no actionable mutation reported on her final report of the molecular studies. I discussed with the patient the adverse effect of this  treatment including but not limited to alopecia, myelosuppression, nausea and vomiting, peripheral neuropathy, liver or renal dysfunction as well as bleeding issues from the Avastin. The patient will receive vitamin B12 injection today. We will also start the patient on folic acid 1 mg p.o. daily in addition to Decadron 4 mg p.o. twice daily the day before, day of and day after the chemotherapy.  She will also have prescription for Compazine on as-needed basis for nausea. We will arrange for the patient to have a chemotherapy education class. The patient will come back for follow-up visit in 2 weeks for evaluation with the start of the first cycle of her systemic chemotherapy. She was advised to call immediately if she has any concerning symptoms in the interval. I spent 30 minutes counseling the patient face to face. The total time spent in the appointment was 40 minutes.

## 2017-11-10 NOTE — Patient Instructions (Addendum)
PDL1 = 50%  For Alimta: Take Dexamethasone 4 mg twice a day the day before, day of, and day after each cycle of chemo.   Carboplatin injection What is this medicine? CARBOPLATIN (KAR boe pla tin) is a chemotherapy drug. It targets fast dividing cells, like cancer cells, and causes these cells to die. This medicine is used to treat ovarian cancer and many other cancers. This medicine may be used for other purposes; ask your health care provider or pharmacist if you have questions. COMMON BRAND NAME(S): Paraplatin What should I tell my health care provider before I take this medicine? They need to know if you have any of these conditions: -blood disorders -hearing problems -kidney disease -recent or ongoing radiation therapy -an unusual or allergic reaction to carboplatin, cisplatin, other chemotherapy, other medicines, foods, dyes, or preservatives -pregnant or trying to get pregnant -breast-feeding How should I use this medicine? This drug is usually given as an infusion into a vein. It is administered in a hospital or clinic by a specially trained health care professional. Talk to your pediatrician regarding the use of this medicine in children. Special care may be needed. Overdosage: If you think you have taken too much of this medicine contact a poison control center or emergency room at once. NOTE: This medicine is only for you. Do not share this medicine with others. What if I miss a dose? It is important not to miss a dose. Call your doctor or health care professional if you are unable to keep an appointment. What may interact with this medicine? -medicines for seizures -medicines to increase blood counts like filgrastim, pegfilgrastim, sargramostim -some antibiotics like amikacin, gentamicin, neomycin, streptomycin, tobramycin -vaccines Talk to your doctor or health care professional before taking any of these  medicines: -acetaminophen -aspirin -ibuprofen -ketoprofen -naproxen This list may not describe all possible interactions. Give your health care provider a list of all the medicines, herbs, non-prescription drugs, or dietary supplements you use. Also tell them if you smoke, drink alcohol, or use illegal drugs. Some items may interact with your medicine. What should I watch for while using this medicine? Your condition will be monitored carefully while you are receiving this medicine. You will need important blood work done while you are taking this medicine. This drug may make you feel generally unwell. This is not uncommon, as chemotherapy can affect healthy cells as well as cancer cells. Report any side effects. Continue your course of treatment even though you feel ill unless your doctor tells you to stop. In some cases, you may be given additional medicines to help with side effects. Follow all directions for their use. Call your doctor or health care professional for advice if you get a fever, chills or sore throat, or other symptoms of a cold or flu. Do not treat yourself. This drug decreases your body's ability to fight infections. Try to avoid being around people who are sick. This medicine may increase your risk to bruise or bleed. Call your doctor or health care professional if you notice any unusual bleeding. Be careful brushing and flossing your teeth or using a toothpick because you may get an infection or bleed more easily. If you have any dental work done, tell your dentist you are receiving this medicine. Avoid taking products that contain aspirin, acetaminophen, ibuprofen, naproxen, or ketoprofen unless instructed by your doctor. These medicines may hide a fever. Do not become pregnant while taking this medicine. Women should inform their doctor if they wish to  become pregnant or think they might be pregnant. There is a potential for serious side effects to an unborn child. Talk to  your health care professional or pharmacist for more information. Do not breast-feed an infant while taking this medicine. What side effects may I notice from receiving this medicine? Side effects that you should report to your doctor or health care professional as soon as possible: -allergic reactions like skin rash, itching or hives, swelling of the face, lips, or tongue -signs of infection - fever or chills, cough, sore throat, pain or difficulty passing urine -signs of decreased platelets or bleeding - bruising, pinpoint red spots on the skin, black, tarry stools, nosebleeds -signs of decreased red blood cells - unusually weak or tired, fainting spells, lightheadedness -breathing problems -changes in hearing -changes in vision -chest pain -high blood pressure -low blood counts - This drug may decrease the number of white blood cells, red blood cells and platelets. You may be at increased risk for infections and bleeding. -nausea and vomiting -pain, swelling, redness or irritation at the injection site -pain, tingling, numbness in the hands or feet -problems with balance, talking, walking -trouble passing urine or change in the amount of urine Side effects that usually do not require medical attention (report to your doctor or health care professional if they continue or are bothersome): -hair loss -loss of appetite -metallic taste in the mouth or changes in taste This list may not describe all possible side effects. Call your doctor for medical advice about side effects. You may report side effects to FDA at 1-800-FDA-1088. Where should I keep my medicine? This drug is given in a hospital or clinic and will not be stored at home. NOTE: This sheet is a summary. It may not cover all possible information. If you have questions about this medicine, talk to your doctor, pharmacist, or health care provider.  2018 Elsevier/Gold Standard (2007-11-15 14:38:05)  Pemetrexed injection What is  this medicine? PEMETREXED (PEM e TREX ed) is a chemotherapy drug used to treat lung cancers like non-small cell lung cancer and mesothelioma. It may also be used to treat other cancers. This medicine may be used for other purposes; ask your health care provider or pharmacist if you have questions. COMMON BRAND NAME(S): Alimta What should I tell my health care provider before I take this medicine? They need to know if you have any of these conditions: -infection (especially a virus infection such as chickenpox, cold sores, or herpes) -kidney disease -low blood counts, like low white cell, platelet, or red cell counts -lung or breathing disease, like asthma -radiation therapy -an unusual or allergic reaction to pemetrexed, other medicines, foods, dyes, or preservative -pregnant or trying to get pregnant -breast-feeding How should I use this medicine? This drug is given as an infusion into a vein. It is administered in a hospital or clinic by a specially trained health care professional. Talk to your pediatrician regarding the use of this medicine in children. Special care may be needed. Overdosage: If you think you have taken too much of this medicine contact a poison control center or emergency room at once. NOTE: This medicine is only for you. Do not share this medicine with others. What if I miss a dose? It is important not to miss your dose. Call your doctor or health care professional if you are unable to keep an appointment. What may interact with this medicine? This medicine may interact with the following medications: -Ibuprofen This list may not describe  all possible interactions. Give your health care provider a list of all the medicines, herbs, non-prescription drugs, or dietary supplements you use. Also tell them if you smoke, drink alcohol, or use illegal drugs. Some items may interact with your medicine. What should I watch for while using this medicine? Visit your doctor for  checks on your progress. This drug may make you feel generally unwell. This is not uncommon, as chemotherapy can affect healthy cells as well as cancer cells. Report any side effects. Continue your course of treatment even though you feel ill unless your doctor tells you to stop. In some cases, you may be given additional medicines to help with side effects. Follow all directions for their use. Call your doctor or health care professional for advice if you get a fever, chills or sore throat, or other symptoms of a cold or flu. Do not treat yourself. This drug decreases your body's ability to fight infections. Try to avoid being around people who are sick. This medicine may increase your risk to bruise or bleed. Call your doctor or health care professional if you notice any unusual bleeding. Be careful brushing and flossing your teeth or using a toothpick because you may get an infection or bleed more easily. If you have any dental work done, tell your dentist you are receiving this medicine. Avoid taking products that contain aspirin, acetaminophen, ibuprofen, naproxen, or ketoprofen unless instructed by your doctor. These medicines may hide a fever. Call your doctor or health care professional if you get diarrhea or mouth sores. Do not treat yourself. To protect your kidneys, drink water or other fluids as directed while you are taking this medicine. Do not become pregnant while taking this medicine or for 6 months after stopping it. Women should inform their doctor if they wish to become pregnant or think they might be pregnant. Men should not father a child while taking this medicine and for 3 months after stopping it. This may interfere with the ability to father a child. You should talk to your doctor or health care professional if you are concerned about your fertility. There is a potential for serious side effects to an unborn child. Talk to your health care professional or pharmacist for more  information. Do not breast-feed an infant while taking this medicine or for 1 week after stopping it. What side effects may I notice from receiving this medicine? Side effects that you should report to your doctor or health care professional as soon as possible: -allergic reactions like skin rash, itching or hives, swelling of the face, lips, or tongue -breathing problems -redness, blistering, peeling or loosening of the skin, including inside the mouth -signs and symptoms of bleeding such as bloody or black, tarry stools; red or dark-brown urine; spitting up blood or brown material that looks like coffee grounds; red spots on the skin; unusual bruising or bleeding from the eye, gums, or nose -signs and symptoms of infection like fever or chills; cough; sore throat; pain or trouble passing urine -signs and symptoms of kidney injury like trouble passing urine or change in the amount of urine -signs and symptoms of liver injury like dark yellow or brown urine; general ill feeling or flu-like symptoms; light-colored stools; loss of appetite; nausea; right upper belly pain; unusually weak or tired; yellowing of the eyes or skin Side effects that usually do not require medical attention (report to your doctor or health care professional if they continue or are bothersome): -constipation -dizziness -mouth sores -  nausea, vomiting -pain, tingling, numbness in the hands or feet -unusually weak or tired This list may not describe all possible side effects. Call your doctor for medical advice about side effects. You may report side effects to FDA at 1-800-FDA-1088. Where should I keep my medicine? This drug is given in a hospital or clinic and will not be stored at home. NOTE: This sheet is a summary. It may not cover all possible information. If you have questions about this medicine, talk to your doctor, pharmacist, or health care provider.  2018 Elsevier/Gold Standard (2016-06-09  18:51:46)   Bevacizumab injection What is this medicine? BEVACIZUMAB (be va SIZ yoo mab) is a monoclonal antibody. It is used to treat many types of cancer. This medicine may be used for other purposes; ask your health care provider or pharmacist if you have questions. COMMON BRAND NAME(S): Avastin What should I tell my health care provider before I take this medicine? They need to know if you have any of these conditions: -diabetes -heart disease -high blood pressure -history of coughing up blood -prior anthracycline chemotherapy (e.g., doxorubicin, daunorubicin, epirubicin) -recent or ongoing radiation therapy -recent or planning to have surgery -stroke -an unusual or allergic reaction to bevacizumab, hamster proteins, mouse proteins, other medicines, foods, dyes, or preservatives -pregnant or trying to get pregnant -breast-feeding How should I use this medicine? This medicine is for infusion into a vein. It is given by a health care professional in a hospital or clinic setting. Talk to your pediatrician regarding the use of this medicine in children. Special care may be needed. Overdosage: If you think you have taken too much of this medicine contact a poison control center or emergency room at once. NOTE: This medicine is only for you. Do not share this medicine with others. What if I miss a dose? It is important not to miss your dose. Call your doctor or health care professional if you are unable to keep an appointment. What may interact with this medicine? Interactions are not expected. This list may not describe all possible interactions. Give your health care provider a list of all the medicines, herbs, non-prescription drugs, or dietary supplements you use. Also tell them if you smoke, drink alcohol, or use illegal drugs. Some items may interact with your medicine. What should I watch for while using this medicine? Your condition will be monitored carefully while you are  receiving this medicine. You will need important blood work and urine testing done while you are taking this medicine. This medicine may increase your risk to bruise or bleed. Call your doctor or health care professional if you notice any unusual bleeding. This medicine should be started at least 28 days following major surgery and the site of the surgery should be totally healed. Check with your doctor before scheduling dental work or surgery while you are receiving this treatment. Talk to your doctor if you have recently had surgery or if you have a wound that has not healed. Do not become pregnant while taking this medicine or for 6 months after stopping it. Women should inform their doctor if they wish to become pregnant or think they might be pregnant. There is a potential for serious side effects to an unborn child. Talk to your health care professional or pharmacist for more information. Do not breast-feed an infant while taking this medicine and for 6 months after the last dose. This medicine has caused ovarian failure in some women. This medicine may interfere with the ability  to have a child. You should talk to your doctor or health care professional if you are concerned about your fertility. What side effects may I notice from receiving this medicine? Side effects that you should report to your doctor or health care professional as soon as possible: -allergic reactions like skin rash, itching or hives, swelling of the face, lips, or tongue -chest pain or chest tightness -chills -coughing up blood -high fever -seizures -severe constipation -signs and symptoms of bleeding such as bloody or black, tarry stools; red or dark-brown urine; spitting up blood or brown material that looks like coffee grounds; red spots on the skin; unusual bruising or bleeding from the eye, gums, or nose -signs and symptoms of a blood clot such as breathing problems; chest pain; severe, sudden headache; pain,  swelling, warmth in the leg -signs and symptoms of a stroke like changes in vision; confusion; trouble speaking or understanding; severe headaches; sudden numbness or weakness of the face, arm or leg; trouble walking; dizziness; loss of balance or coordination -stomach pain -sweating -swelling of legs or ankles -vomiting -weight gain Side effects that usually do not require medical attention (report to your doctor or health care professional if they continue or are bothersome): -back pain -changes in taste -decreased appetite -dry skin -nausea -tiredness This list may not describe all possible side effects. Call your doctor for medical advice about side effects. You may report side effects to FDA at 1-800-FDA-1088. Where should I keep my medicine? This drug is given in a hospital or clinic and will not be stored at home. NOTE: This sheet is a summary. It may not cover all possible information. If you have questions about this medicine, talk to your doctor, pharmacist, or health care provider.  2018 Elsevier/Gold Standard (2016-08-07 14:33:29)

## 2017-11-10 NOTE — Telephone Encounter (Signed)
Gave patient avs and calendar with appts per 3/20 los.

## 2017-11-10 NOTE — Assessment & Plan Note (Signed)
This is a very pleasant 52 year old white female with stage IV non-small cell lung cancer, adenocarcinoma presenting with a left middle lobe mass that abuts the mediastinal border/pericardium and the pleural surface.  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 The patient completed radiation to her brain and left lung mass earlier today. She is here for evaluation and to discuss her treatment options.  The patient was seen with Dr. Julien Nordmann.  He had a lengthy discussion with the patient and her family regarding her diagnosis, prognosis, and treatment options.  The patient was advised that her condition is not curable.  We discussed that her PDL 1 is 50%, but that molecular testing is pending.  Additionally, we have sent her blood work to test for EGFR.  Treatment options were discussed including palliative care/hospice versus treatment with a targeted therapy if she has an  actionable mutation versus systemic chemotherapy.  If the patient is found to have an actionable mutation, we will plan to start the patient on targeted therapy.  However, if there is no actionable mutation, recommend treatment with systemic chemotherapy.  Given the patient's history of lupus, rheumatoid arthritis, and autoimmune hepatitis, immunotherapy will not be utilized.   Systemic chemotherapy would consist of carboplatin for an AUC of 5, Alimta 500 mg meter squared, and Avastin 15 mg/kg every 3 weeks. Discussed with the patient adverse effects of this treatment including but not limited to alopecia, myelosuppression, nausea and vomiting, peripheral neuropathy, liver or renal dysfunction in addition to the adverse effect of Avastin including but not limited to pulmonary hemorrhage, GI perforation, wound healing delay as well as hypertension and proteinuria. We will arrange for the patient to receive vitamin B 12 injection today. The patient will also receive prescription for Compazine 10 mg by  mouth every 6 hours as needed for nausea, Decadron 4 mg by mouth twice a day, the day before, day of and day after the chemotherapy in addition to folic acid 1 mg by mouth daily. A prescription for EMLA cream was sent to her pharmacy. The patient have a chemotherapy education class. Anticipate the first dose of chemotherapy on 11/23/2017.  She will be seen back for follow-up on that date for evaluation and repeat lab work and to discuss her EGFR results and molecular testing. The patient will have weekly labs while on chemotherapy.  Referral has been made to social work per the patient request due to concerns over finances.  The patient was advised to call immediately if she has any concerning symptoms in the interval.  The patient voices understanding of her current disease status and treatment options and is in agreement with the current care plan.  All questions were answered.  The patient knows to call the clinic with any problems, questions, or concerns.  We can certainly see the patient much sooner if necessary.

## 2017-11-10 NOTE — Progress Notes (Signed)
START ON PATHWAY REGIMEN - Non-Small Cell Lung     A cycle is every 21 days:     Carboplatin      Pemetrexed      Bevacizumab   **Always confirm dose/schedule in your pharmacy ordering system**    Patient Characteristics: Stage IV Metastatic, Nonsquamous, Initial Chemotherapy/Immunotherapy, PS = 0, 1, PD-L1 Expression Positive 1-49% (TPS) / Negative / Not Tested / Awaiting Test Results AJCC T Category: T3 Current Disease Status: Distant Metastases AJCC N Category: N3 AJCC M Category: M1c AJCC 8 Stage Grouping: IVB Histology: Nonsquamous Cell ROS1 Rearrangement Status: Awaiting Test Results T790M Mutation Status: Not Applicable - EGFR Mutation Negative/Unknown Other Mutations/Biomarkers: No Other Actionable Mutations PD-L1 Expression Status: Awaiting Test Results Chemotherapy/Immunotherapy LOT: Initial Chemotherapy/Immunotherapy Molecular Targeted Therapy: Not Appropriate ALK Translocation Status: Awaiting Test Results Would you be surprised if this patient died  in the next year<= I would NOT be surprised if this patient died in the next year EGFR Mutation Status: Awaiting Test Results BRAF [id number] Mutation Status: Awaiting Test Results Performance Status: PS = 0, 1 Intent of Therapy: Non-Curative / Palliative Intent, Discussed with Patient

## 2017-11-15 NOTE — Progress Notes (Signed)
  Radiation Oncology         [phone] ________________________________  Name: Diana Schwartz MRN: [id number]  Date: 11/10/2017  DOB: 07-23-1966  End of Treatment Note  Diagnosis:   53 y.o. female with left upper lung cancer and 22 brain metastases     Indication for treatment:  Palliative       Radiation treatment dates:   10/28/2017 - 11/10/2017  Site/dose:   1. Left Lung / 30 Gy in 10 fractions 2. Whole Brain / 30 Gy in 10 fractions  Beams/energy:   1. 3D / 15X Photon 2. Isodose Plan / 6X Photon  Narrative: The patient tolerated radiation treatment relatively well.  She experienced moderate fatigue and had some alopecia noted in the treatment field. She reported headaches, nausea, and vomiting occasionally before treatment. She denied any vision or auditory changes, although she felt that her cognitive and motor functions were slower. She reported some short-term memory loss, slurred speech, unsteady gait, and difficulty with holding objects. She did experience several seizures during the course of treatment and was started on Keppra on 11/02/17 and her Decadron dose was tapered from 4 mg QID to 4 mg BID which she continued throughout her course of radiation. She reported a cough with occasional blood as well as dysphasia and odynophagia. She otherwise reported a good appetite and was able to maintain her normal weight. Skin changes included erythema to her chest without desquamation.  Decadron was tapered to 4mg  qd x 2 weeks, beginning 11/16/17, and then 1/2 tablet (2mg ) qd to continue until follow up 12/21/17.  Plan: The patient has completed radiation treatment. The patient will return to radiation oncology clinic for routine followup in one month. I advised her to call or return sooner if she has any questions or concerns related to her recovery or treatment. ________________________________  Sheral Apley. Tammi Klippel, M.D.  This document serves as a record of services personally  performed by Tyler Pita, MD. It was created on his behalf by Rae Lips, a trained medical scribe. The creation of this record is based on the scribe's personal observations and the provider's statements to them. This document has been checked and approved by the attending provider.

## 2017-11-16 ENCOUNTER — Telehealth: Payer: Self-pay | Admitting: Radiation Oncology

## 2017-11-16 NOTE — Telephone Encounter (Signed)
Received voicemail message from patient's husband, Ronalee Belts, concerned his wife isn't sleeping. Phoned Ronalee Belts back to inquire further. He reports his wife will stay awake unable to sleep for 2-3 days then sleep for 3 hours. He states, "the xanax doesn't even help her sleep." He confirms she is taking decadron 4 mg bid and Keppra as directed. He denies any new or worsening neurologic symptoms. He reports her attention span is low but, she is managing to de-clutter their home and give things away. He confirms her gait is steady. He reports she is able to watch TV now where her visual changes wouldn't allow her to before. Consulted with Maudie Mercury, RN and Dr. Mickeal Skinner. Phoned Ronalee Belts back. Explained that per Dr. Mickeal Skinner the decadron 4 mg twice per day should be decreased to once per day for two weeks then 2 mg (1/2 tablet) once per day until follow up. Stressed if neurologic symptoms change or worsen to contact Dr. Renda Rolls nurse, Maudie Mercury, at [phone] asap. Ronalee Belts read back directions for taper and Kim's number correctly and expressed appreciation for the assistance.

## 2017-11-18 ENCOUNTER — Encounter (HOSPITAL_COMMUNITY): Payer: Self-pay | Admitting: Internal Medicine

## 2017-11-18 LAB — EPIDERMAL GROWTH FACTOR RECEPTOR (EGFR) MUTATION ANALYSIS

## 2017-11-19 ENCOUNTER — Telehealth: Payer: Self-pay | Admitting: *Deleted

## 2017-11-19 ENCOUNTER — Inpatient Hospital Stay: Payer: 59

## 2017-11-19 ENCOUNTER — Other Ambulatory Visit: Payer: Self-pay | Admitting: Oncology

## 2017-11-19 MED ORDER — TEMAZEPAM 30 MG PO CAPS: 30.0000 mg | ORAL_CAPSULE | Freq: Every evening | ORAL | 0 refills | Status: DC | PRN

## 2017-11-19 MED ORDER — TEMAZEPAM 15 MG PO CAPS: ORAL_CAPSULE | ORAL | 0 refills | Status: DC

## 2017-11-19 NOTE — Telephone Encounter (Signed)
Received call from pt's husband stating that Dr Mickeal Skinner has decreased steroid by half & she is taking one decadron 4 mg daily since Wed & she is still not sleeping.  She has taken Xanax & 2 Tylenol PM with no help.  She is busy cleaning & declutering the house & he states she can't go on like this.  He is very concerned & thinks she needs something for sleep.  They are coming this am for chemo ed class.  He also mentions some bruising & her skin being dry.  Discussed with Dr Mickeal Skinner & Pt to cont decadron 4 mg daily.  Discussed with Altamese Dilling NP & she will add restoril for sleep & states to remind pt not to take xanax with it.  Will also have pt avoid Tyl PM in case Benadryl may make pt more hyper.  Discussed additional dex day b4 , day of & day after with Alimta with Dr Mickeal Skinner & Dr Mohamed/Kristen.  Pharmacist Evelena Peat thought pt should only take 1 tab daily instead of bid due to sleeping issues.  She verified this with Dr Julien Nordmann who is in aggrement.  Informed Wendy/[facility] who will discuss with pt/husband.

## 2017-11-19 NOTE — Telephone Encounter (Signed)
"  Amanada Rph calling about order received today forTemazepam 30 mg.  May order be changed to 15 mg to use two pills? "  Verbal order received and read back from Mikey Bussing, NP for Temazepam 15 mg capsules, take two by mouth at bedtime as needed for sleep.  Order given to pharmacist at this time.

## 2017-11-22 ENCOUNTER — Inpatient Hospital Stay: Payer: 59

## 2017-11-22 ENCOUNTER — Inpatient Hospital Stay: Payer: 59 | Attending: Oncology | Admitting: Oncology

## 2017-11-22 ENCOUNTER — Telehealth: Payer: Self-pay | Admitting: Oncology

## 2017-11-22 ENCOUNTER — Encounter: Payer: Self-pay | Admitting: Oncology

## 2017-11-22 VITALS — BP 91/55 | HR 107 | Temp 99.0°F | Resp 17 | Wt 122.0 lb

## 2017-11-22 DIAGNOSIS — K754 Autoimmune hepatitis: Secondary | ICD-10-CM | POA: Diagnosis not present

## 2017-11-22 DIAGNOSIS — C3490 Malignant neoplasm of unspecified part of unspecified bronchus or lung: Secondary | ICD-10-CM

## 2017-11-22 DIAGNOSIS — Z9013 Acquired absence of bilateral breasts and nipples: Secondary | ICD-10-CM

## 2017-11-22 DIAGNOSIS — B370 Candidal stomatitis: Secondary | ICD-10-CM | POA: Insufficient documentation

## 2017-11-22 DIAGNOSIS — C7931 Secondary malignant neoplasm of brain: Secondary | ICD-10-CM | POA: Diagnosis not present

## 2017-11-22 DIAGNOSIS — K5900 Constipation, unspecified: Secondary | ICD-10-CM | POA: Insufficient documentation

## 2017-11-22 DIAGNOSIS — Z79899 Other long term (current) drug therapy: Secondary | ICD-10-CM | POA: Diagnosis not present

## 2017-11-22 DIAGNOSIS — Z9223 Personal history of estrogen therapy: Secondary | ICD-10-CM | POA: Diagnosis not present

## 2017-11-22 DIAGNOSIS — J0100 Acute maxillary sinusitis, unspecified: Secondary | ICD-10-CM | POA: Diagnosis not present

## 2017-11-22 DIAGNOSIS — C342 Malignant neoplasm of middle lobe, bronchus or lung: Secondary | ICD-10-CM | POA: Diagnosis not present

## 2017-11-22 DIAGNOSIS — C771 Secondary and unspecified malignant neoplasm of intrathoracic lymph nodes: Secondary | ICD-10-CM | POA: Diagnosis not present

## 2017-11-22 DIAGNOSIS — C7951 Secondary malignant neoplasm of bone: Secondary | ICD-10-CM | POA: Diagnosis not present

## 2017-11-22 DIAGNOSIS — Z170 Estrogen receptor positive status [ER+]: Secondary | ICD-10-CM | POA: Insufficient documentation

## 2017-11-22 DIAGNOSIS — Z853 Personal history of malignant neoplasm of breast: Secondary | ICD-10-CM

## 2017-11-22 DIAGNOSIS — Z923 Personal history of irradiation: Secondary | ICD-10-CM | POA: Insufficient documentation

## 2017-11-22 DIAGNOSIS — Z5111 Encounter for antineoplastic chemotherapy: Secondary | ICD-10-CM | POA: Insufficient documentation

## 2017-11-22 DIAGNOSIS — M069 Rheumatoid arthritis, unspecified: Secondary | ICD-10-CM | POA: Insufficient documentation

## 2017-11-22 LAB — CBC WITH DIFFERENTIAL (CANCER CENTER ONLY)
BASOS ABS: 0 10*3/uL (ref 0.0–0.1)
Basophils Relative: 0 %
EOS ABS: 0 10*3/uL (ref 0.0–0.5)
EOS PCT: 1 %
HCT: 36.4 % (ref 34.8–46.6)
Hemoglobin: 11.9 g/dL (ref 11.6–15.9)
Lymphocytes Relative: 5 %
Lymphs Abs: 0.2 10*3/uL — ABNORMAL LOW (ref 0.9–3.3)
MCH: 33.1 pg (ref 25.1–34.0)
MCHC: 32.7 g/dL (ref 31.5–36.0)
MCV: 101.4 fL — AB (ref 79.5–101.0)
MONO ABS: 0.6 10*3/uL (ref 0.1–0.9)
Monocytes Relative: 14 %
Neutro Abs: 3.2 10*3/uL (ref 1.5–6.5)
Neutrophils Relative %: 80 %
PLATELETS: 200 10*3/uL (ref 145–400)
RBC: 3.59 MIL/uL — AB (ref 3.70–5.45)
RDW: 15 % — AB (ref 11.2–14.5)
WBC: 4 10*3/uL (ref 3.9–10.3)

## 2017-11-22 LAB — CMP (CANCER CENTER ONLY)
ALT: 27 U/L (ref 0–55)
AST: 16 U/L (ref 5–34)
Albumin: 2.6 g/dL — ABNORMAL LOW (ref 3.5–5.0)
Alkaline Phosphatase: 96 U/L (ref 40–150)
Anion gap: 8 (ref 3–11)
BUN: 18 mg/dL (ref 7–26)
CO2: 25 mmol/L (ref 22–29)
CREATININE: 0.61 mg/dL (ref 0.60–1.10)
Calcium: 8.6 mg/dL (ref 8.4–10.4)
Chloride: 103 mmol/L (ref 98–109)
Glucose, Bld: 82 mg/dL (ref 70–140)
POTASSIUM: 3.5 mmol/L (ref 3.5–5.1)
SODIUM: 136 mmol/L (ref 136–145)
Total Bilirubin: <0.2 mg/dL — ABNORMAL LOW (ref 0.2–1.2)
Total Protein: 6.3 g/dL — ABNORMAL LOW (ref 6.4–8.3)

## 2017-11-22 LAB — TOTAL PROTEIN, URINE DIPSTICK

## 2017-11-22 MED ORDER — SODIUM CHLORIDE 0.9% FLUSH
10.0000 mL | INTRAVENOUS | Status: DC | PRN
  Administered 2017-11-22: 10 mL
  Filled 2017-11-22: qty 10

## 2017-11-22 MED ORDER — SODIUM CHLORIDE 0.9 % IV SOLN
Freq: Once | INTRAVENOUS | Status: AC
  Administered 2017-11-22: 11:00:00 via INTRAVENOUS
  Filled 2017-11-22: qty 5

## 2017-11-22 MED ORDER — PALONOSETRON HCL INJECTION 0.25 MG/5ML
0.2500 mg | Freq: Once | INTRAVENOUS | Status: AC
Start: 2017-11-22 — End: 2017-11-22
  Administered 2017-11-22: 0.25 mg via INTRAVENOUS

## 2017-11-22 MED ORDER — SODIUM CHLORIDE 0.9 % IV SOLN
14.6000 mg/kg | Freq: Once | INTRAVENOUS | Status: AC
  Administered 2017-11-22: 800 mg via INTRAVENOUS
  Filled 2017-11-22: qty 32

## 2017-11-22 MED ORDER — SODIUM CHLORIDE 0.9 % IV SOLN
Freq: Once | INTRAVENOUS | Status: AC
  Administered 2017-11-22: 11:00:00 via INTRAVENOUS

## 2017-11-22 MED ORDER — SODIUM CHLORIDE 0.9 % IV SOLN
484.5000 mg | Freq: Once | INTRAVENOUS | Status: AC
  Administered 2017-11-22: 480 mg via INTRAVENOUS
  Filled 2017-11-22: qty 48

## 2017-11-22 MED ORDER — PEMETREXED DISODIUM CHEMO INJECTION 500 MG
510.0000 mg/m2 | Freq: Once | INTRAVENOUS | Status: AC
  Administered 2017-11-22: 800 mg via INTRAVENOUS
  Filled 2017-11-22: qty 20

## 2017-11-22 MED ORDER — PALONOSETRON HCL INJECTION 0.25 MG/5ML
INTRAVENOUS | Status: AC
  Filled 2017-11-22: qty 5

## 2017-11-22 MED ORDER — HEPARIN SOD (PORK) LOCK FLUSH 100 UNIT/ML IV SOLN
500.0000 [IU] | Freq: Once | INTRAVENOUS | Status: AC | PRN
  Administered 2017-11-22: 500 [IU]
  Filled 2017-11-22: qty 5

## 2017-11-22 NOTE — Patient Instructions (Signed)
[facility] Discharge Instructions for Patients Receiving Chemotherapy  Today you received the following chemotherapy agents Avastin, Alimta, Carboplatin  To help prevent nausea and vomiting after your treatment, we encourage you to take your nausea medication.   If you develop nausea and vomiting that is not controlled by your nausea medication, call the clinic.   BELOW ARE SYMPTOMS THAT SHOULD BE REPORTED IMMEDIATELY:  *FEVER GREATER THAN 100.5 F  *CHILLS WITH OR WITHOUT FEVER  NAUSEA AND VOMITING THAT IS NOT CONTROLLED WITH YOUR NAUSEA MEDICATION  *UNUSUAL SHORTNESS OF BREATH  *UNUSUAL BRUISING OR BLEEDING  TENDERNESS IN MOUTH AND THROAT WITH OR WITHOUT PRESENCE OF ULCERS  *URINARY PROBLEMS  *BOWEL PROBLEMS  UNUSUAL RASH Items with * indicate a potential emergency and should be followed up as soon as possible.  Feel free to call the clinic should you have any questions or concerns. The clinic phone number is [phone].  Please show the [facility] at check-in to the Emergency Department and triage nurse.  Bevacizumab injection What is this medicine? BEVACIZUMAB (be va SIZ yoo mab) is a monoclonal antibody. It is used to treat many types of cancer. This medicine may be used for other purposes; ask your health care provider or pharmacist if you have questions. COMMON BRAND NAME(S): Avastin What should I tell my health care provider before I take this medicine? They need to know if you have any of these conditions: -diabetes -heart disease -high blood pressure -history of coughing up blood -prior anthracycline chemotherapy (e.g., doxorubicin, daunorubicin, epirubicin) -recent or ongoing radiation therapy -recent or planning to have surgery -stroke -an unusual or allergic reaction to bevacizumab, hamster proteins, mouse proteins, other medicines, foods, dyes, or preservatives -pregnant or trying to get pregnant -breast-feeding How should I  use this medicine? This medicine is for infusion into a vein. It is given by a health care professional in a hospital or clinic setting. Talk to your pediatrician regarding the use of this medicine in children. Special care may be needed. Overdosage: If you think you have taken too much of this medicine contact a poison control center or emergency room at once. NOTE: This medicine is only for you. Do not share this medicine with others. What if I miss a dose? It is important not to miss your dose. Call your doctor or health care professional if you are unable to keep an appointment. What may interact with this medicine? Interactions are not expected. This list may not describe all possible interactions. Give your health care provider a list of all the medicines, herbs, non-prescription drugs, or dietary supplements you use. Also tell them if you smoke, drink alcohol, or use illegal drugs. Some items may interact with your medicine. What should I watch for while using this medicine? Your condition will be monitored carefully while you are receiving this medicine. You will need important blood work and urine testing done while you are taking this medicine. This medicine may increase your risk to bruise or bleed. Call your doctor or health care professional if you notice any unusual bleeding. This medicine should be started at least 28 days following major surgery and the site of the surgery should be totally healed. Check with your doctor before scheduling dental work or surgery while you are receiving this treatment. Talk to your doctor if you have recently had surgery or if you have a wound that has not healed. Do not become pregnant while taking this medicine or for 6 months  after stopping it. Women should inform their doctor if they wish to become pregnant or think they might be pregnant. There is a potential for serious side effects to an unborn child. Talk to your health care professional or  pharmacist for more information. Do not breast-feed an infant while taking this medicine and for 6 months after the last dose. This medicine has caused ovarian failure in some women. This medicine may interfere with the ability to have a child. You should talk to your doctor or health care professional if you are concerned about your fertility. What side effects may I notice from receiving this medicine? Side effects that you should report to your doctor or health care professional as soon as possible: -allergic reactions like skin rash, itching or hives, swelling of the face, lips, or tongue -chest pain or chest tightness -chills -coughing up blood -high fever -seizures -severe constipation -signs and symptoms of bleeding such as bloody or black, tarry stools; red or dark-brown urine; spitting up blood or brown material that looks like coffee grounds; red spots on the skin; unusual bruising or bleeding from the eye, gums, or nose -signs and symptoms of a blood clot such as breathing problems; chest pain; severe, sudden headache; pain, swelling, warmth in the leg -signs and symptoms of a stroke like changes in vision; confusion; trouble speaking or understanding; severe headaches; sudden numbness or weakness of the face, arm or leg; trouble walking; dizziness; loss of balance or coordination -stomach pain -sweating -swelling of legs or ankles -vomiting -weight gain Side effects that usually do not require medical attention (report to your doctor or health care professional if they continue or are bothersome): -back pain -changes in taste -decreased appetite -dry skin -nausea -tiredness This list may not describe all possible side effects. Call your doctor for medical advice about side effects. You may report side effects to FDA at 1-800-FDA-1088. Where should I keep my medicine? This drug is given in a hospital or clinic and will not be stored at home. NOTE: This sheet is a summary. It  may not cover all possible information. If you have questions about this medicine, talk to your doctor, pharmacist, or health care provider.  2018 Elsevier/Gold Standard (2016-08-07 14:33:29)  Pemetrexed injection What is this medicine? PEMETREXED (PEM e TREX ed) is a chemotherapy drug used to treat lung cancers like non-small cell lung cancer and mesothelioma. It may also be used to treat other cancers. This medicine may be used for other purposes; ask your health care provider or pharmacist if you have questions. COMMON BRAND NAME(S): Alimta What should I tell my health care provider before I take this medicine? They need to know if you have any of these conditions: -infection (especially a virus infection such as chickenpox, cold sores, or herpes) -kidney disease -low blood counts, like low white cell, platelet, or red cell counts -lung or breathing disease, like asthma -radiation therapy -an unusual or allergic reaction to pemetrexed, other medicines, foods, dyes, or preservative -pregnant or trying to get pregnant -breast-feeding How should I use this medicine? This drug is given as an infusion into a vein. It is administered in a hospital or clinic by a specially trained health care professional. Talk to your pediatrician regarding the use of this medicine in children. Special care may be needed. Overdosage: If you think you have taken too much of this medicine contact a poison control center or emergency room at once. NOTE: This medicine is only for you. Do not  share this medicine with others. What if I miss a dose? It is important not to miss your dose. Call your doctor or health care professional if you are unable to keep an appointment. What may interact with this medicine? This medicine may interact with the following medications: -Ibuprofen This list may not describe all possible interactions. Give your health care provider a list of all the medicines, herbs, non-prescription  drugs, or dietary supplements you use. Also tell them if you smoke, drink alcohol, or use illegal drugs. Some items may interact with your medicine. What should I watch for while using this medicine? Visit your doctor for checks on your progress. This drug may make you feel generally unwell. This is not uncommon, as chemotherapy can affect healthy cells as well as cancer cells. Report any side effects. Continue your course of treatment even though you feel ill unless your doctor tells you to stop. In some cases, you may be given additional medicines to help with side effects. Follow all directions for their use. Call your doctor or health care professional for advice if you get a fever, chills or sore throat, or other symptoms of a cold or flu. Do not treat yourself. This drug decreases your body's ability to fight infections. Try to avoid being around people who are sick. This medicine may increase your risk to bruise or bleed. Call your doctor or health care professional if you notice any unusual bleeding. Be careful brushing and flossing your teeth or using a toothpick because you may get an infection or bleed more easily. If you have any dental work done, tell your dentist you are receiving this medicine. Avoid taking products that contain aspirin, acetaminophen, ibuprofen, naproxen, or ketoprofen unless instructed by your doctor. These medicines may hide a fever. Call your doctor or health care professional if you get diarrhea or mouth sores. Do not treat yourself. To protect your kidneys, drink water or other fluids as directed while you are taking this medicine. Do not become pregnant while taking this medicine or for 6 months after stopping it. Women should inform their doctor if they wish to become pregnant or think they might be pregnant. Men should not father a child while taking this medicine and for 3 months after stopping it. This may interfere with the ability to father a child. You should  talk to your doctor or health care professional if you are concerned about your fertility. There is a potential for serious side effects to an unborn child. Talk to your health care professional or pharmacist for more information. Do not breast-feed an infant while taking this medicine or for 1 week after stopping it. What side effects may I notice from receiving this medicine? Side effects that you should report to your doctor or health care professional as soon as possible: -allergic reactions like skin rash, itching or hives, swelling of the face, lips, or tongue -breathing problems -redness, blistering, peeling or loosening of the skin, including inside the mouth -signs and symptoms of bleeding such as bloody or black, tarry stools; red or dark-brown urine; spitting up blood or brown material that looks like coffee grounds; red spots on the skin; unusual bruising or bleeding from the eye, gums, or nose -signs and symptoms of infection like fever or chills; cough; sore throat; pain or trouble passing urine -signs and symptoms of kidney injury like trouble passing urine or change in the amount of urine -signs and symptoms of liver injury like dark yellow or brown  urine; general ill feeling or flu-like symptoms; light-colored stools; loss of appetite; nausea; right upper belly pain; unusually weak or tired; yellowing of the eyes or skin Side effects that usually do not require medical attention (report to your doctor or health care professional if they continue or are bothersome): -constipation -dizziness -mouth sores -nausea, vomiting -pain, tingling, numbness in the hands or feet -unusually weak or tired This list may not describe all possible side effects. Call your doctor for medical advice about side effects. You may report side effects to FDA at 1-800-FDA-1088. Where should I keep my medicine? This drug is given in a hospital or clinic and will not be stored at home. NOTE: This sheet is a  summary. It may not cover all possible information. If you have questions about this medicine, talk to your doctor, pharmacist, or health care provider.  2018 Elsevier/Gold Standard (2016-06-09 18:51:46)  Carboplatin injection What is this medicine? CARBOPLATIN (KAR boe pla tin) is a chemotherapy drug. It targets fast dividing cells, like cancer cells, and causes these cells to die. This medicine is used to treat ovarian cancer and many other cancers. This medicine may be used for other purposes; ask your health care provider or pharmacist if you have questions. COMMON BRAND NAME(S): Paraplatin What should I tell my health care provider before I take this medicine? They need to know if you have any of these conditions: -blood disorders -hearing problems -kidney disease -recent or ongoing radiation therapy -an unusual or allergic reaction to carboplatin, cisplatin, other chemotherapy, other medicines, foods, dyes, or preservatives -pregnant or trying to get pregnant -breast-feeding How should I use this medicine? This drug is usually given as an infusion into a vein. It is administered in a hospital or clinic by a specially trained health care professional. Talk to your pediatrician regarding the use of this medicine in children. Special care may be needed. Overdosage: If you think you have taken too much of this medicine contact a poison control center or emergency room at once. NOTE: This medicine is only for you. Do not share this medicine with others. What if I miss a dose? It is important not to miss a dose. Call your doctor or health care professional if you are unable to keep an appointment. What may interact with this medicine? -medicines for seizures -medicines to increase blood counts like filgrastim, pegfilgrastim, sargramostim -some antibiotics like amikacin, gentamicin, neomycin, streptomycin, tobramycin -vaccines Talk to your doctor or health care professional before taking  any of these medicines: -acetaminophen -aspirin -ibuprofen -ketoprofen -naproxen This list may not describe all possible interactions. Give your health care provider a list of all the medicines, herbs, non-prescription drugs, or dietary supplements you use. Also tell them if you smoke, drink alcohol, or use illegal drugs. Some items may interact with your medicine. What should I watch for while using this medicine? Your condition will be monitored carefully while you are receiving this medicine. You will need important blood work done while you are taking this medicine. This drug may make you feel generally unwell. This is not uncommon, as chemotherapy can affect healthy cells as well as cancer cells. Report any side effects. Continue your course of treatment even though you feel ill unless your doctor tells you to stop. In some cases, you may be given additional medicines to help with side effects. Follow all directions for their use. Call your doctor or health care professional for advice if you get a fever, chills or sore throat, or  other symptoms of a cold or flu. Do not treat yourself. This drug decreases your body's ability to fight infections. Try to avoid being around people who are sick. This medicine may increase your risk to bruise or bleed. Call your doctor or health care professional if you notice any unusual bleeding. Be careful brushing and flossing your teeth or using a toothpick because you may get an infection or bleed more easily. If you have any dental work done, tell your dentist you are receiving this medicine. Avoid taking products that contain aspirin, acetaminophen, ibuprofen, naproxen, or ketoprofen unless instructed by your doctor. These medicines may hide a fever. Do not become pregnant while taking this medicine. Women should inform their doctor if they wish to become pregnant or think they might be pregnant. There is a potential for serious side effects to an unborn  child. Talk to your health care professional or pharmacist for more information. Do not breast-feed an infant while taking this medicine. What side effects may I notice from receiving this medicine? Side effects that you should report to your doctor or health care professional as soon as possible: -allergic reactions like skin rash, itching or hives, swelling of the face, lips, or tongue -signs of infection - fever or chills, cough, sore throat, pain or difficulty passing urine -signs of decreased platelets or bleeding - bruising, pinpoint red spots on the skin, black, tarry stools, nosebleeds -signs of decreased red blood cells - unusually weak or tired, fainting spells, lightheadedness -breathing problems -changes in hearing -changes in vision -chest pain -high blood pressure -low blood counts - This drug may decrease the number of white blood cells, red blood cells and platelets. You may be at increased risk for infections and bleeding. -nausea and vomiting -pain, swelling, redness or irritation at the injection site -pain, tingling, numbness in the hands or feet -problems with balance, talking, walking -trouble passing urine or change in the amount of urine Side effects that usually do not require medical attention (report to your doctor or health care professional if they continue or are bothersome): -hair loss -loss of appetite -metallic taste in the mouth or changes in taste This list may not describe all possible side effects. Call your doctor for medical advice about side effects. You may report side effects to FDA at 1-800-FDA-1088. Where should I keep my medicine? This drug is given in a hospital or clinic and will not be stored at home. NOTE: This sheet is a summary. It may not cover all possible information. If you have questions about this medicine, talk to your doctor, pharmacist, or health care provider.  2018 Elsevier/Gold Standard (2007-11-15 14:38:05)

## 2017-11-22 NOTE — Assessment & Plan Note (Signed)
This is a very pleasant 52 year old white female with stage IV non-small cell lung cancer, adenocarcinoma presenting with a left middle lobe mass that abuts the mediastinal border/pericardium and the pleural surface.  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 The patient has completed radiation to her brain and left lung mass. She is now here to begin systemic chemotherapy with carboplatin for an AUC of 5, Alimta 500 mg/m, and Avastin 15 mg/kg every 3 weeks.  The patient was seen with Dr. Julien Nordmann.  Discussed results of molecular testing which did not show any actionable mutations.  Recommend that she proceed with systemic chemotherapy as planned.  She will proceed with cycle #1 today. The patient will have weekly labs while on chemotherapy. She will follow-up in 3 weeks for evaluation prior to cycle #2.  For insomnia, she will continue Restoril as needed.  The patient was advised to call immediately if she has any concerning symptoms in the interval. The patient voices understanding of her current disease status and treatment options and is in agreement with the current care plan.  All questions were answered. The patient knows to call the clinic with any problems, questions, or concerns. We can certainly see the patient much sooner if necessary.

## 2017-11-22 NOTE — Patient Instructions (Signed)

## 2017-11-22 NOTE — Progress Notes (Signed)
[facility] [place] OFFICE PROGRESS NOTE  [name], MD [place] [address]  DIAGNOSIS: Stage IV non-small cell lung cancer, adenocarcinoma presenting with a left middle lobe mass that abuts the mediastinal border/pericardium and the pleural surface.  The patient was found to have adrenal and bone metastasis and over 20 brain metastases.  PD-L1 50%  PRIOR THERAPY: The patient completed whole brain radiation and palliative radiation to the left lung on 11/10/2017.  CURRENT THERAPY: Systemic chemotherapy with carboplatin for an AUC of 5, Alimta 500 mg meter squared, and Avastin 15 mg/kg every 3 weeks.  First dose expected on 11/22/2017.  INTERVAL HISTORY: Diana Schwartz 52 y.o. female returns for routine follow-up visit accompanied by her husband.  The patient is feeling fine today and has no specific complaints.  She is sleeping better with taking Restoril.  She denies having recent seizures.  She remains on dexamethasone 4 mg daily.  The patient denies fevers and chills.  Denies chest pain, shortness breath, cough, hemoptysis.  Denies nausea, vomiting, constipation, diarrhea.  The patient is here for evaluation prior to cycle 1 of her chemotherapy.  MEDICAL HISTORY: Past Medical History:  Diagnosis Date  . Anxiety   . Autoimmune hepatitis ([place])    no current med.  . Breast cancer [facility]) 2013   Left breast cancer. Treated at [facility]. Per notes, nvasive lobular carcinoma. S/P bilateral mastectomy with recontruction and Tamoxifen X5 years.   . Carpal tunnel syndrome of left wrist   . Degenerative disc disease, lumbar   . Depression   . Fibromyalgia   . Lupus (systemic lupus erythematosus) ([place])   . Osteoarthritis   . Rash    arms, chest - due to lupus  . Raynaud disease   . Rheumatoid arthritis ([place])   . Sjoegren syndrome     ALLERGIES:  is allergic to oxycodone; doxycycline; and prednisone.  MEDICATIONS:  Current Outpatient Medications   Medication Sig Dispense Refill  . azaTHIOprine (IMURAN) 50 MG tablet Take 50 mg by mouth 2 (two) times daily.    . Cholecalciferol (HM VITAMIN D3) 4000 units CAPS Take by mouth.    . dexamethasone (DECADRON) 4 MG tablet Take 1 tablet twice a day the day before, day of, and day after each cycle of chemotherapy. 30 tablet 1  . folic acid (FOLVITE) 1 MG tablet Take 1 tablet (1 mg total) by mouth daily. 30 tablet 2  . hydroxychloroquine (PLAQUENIL) 200 MG tablet Take 200 mg by mouth 2 (two) times daily.    Marland Kitchen levETIRAcetam (KEPPRA) 500 MG tablet Take 1 tablet (500 mg total) by mouth 2 (two) times daily. 60 tablet 5  . lidocaine-prilocaine (EMLA) cream Apply 1 application topically as needed. 30 g 0  . nystatin (MYCOSTATIN) 100000 UNIT/ML suspension     . omeprazole (PRILOSEC) 40 MG capsule Take by mouth daily.  3  . pantoprazole (PROTONIX) 40 MG tablet Take 1 tablet (40 mg total) by mouth daily. 30 tablet 0  . prochlorperazine (COMPAZINE) 10 MG tablet Take 1 tablet (10 mg total) by mouth every 6 (six) hours as needed for nausea or vomiting. 30 tablet 1  . temazepam (RESTORIL) 15 MG capsule Take two capsules (15 mg) by mouth at bedtime as needed for sleep.  Do not take with Xanax (alprazolam) 60 capsule 0  . venlafaxine XR (EFFEXOR-XR) 150 MG 24 hr capsule Take 2 capsules by mouth daily with breakfast.     . acetaminophen (TYLENOL) 500 MG  tablet Take 1,000 mg by mouth every 6 (six) hours as needed.    . ALPRAZolam (XANAX) 0.5 MG tablet Take 0.5 mg by mouth at bedtime as needed for anxiety.    Marland Kitchen HYDROcodone-acetaminophen (NORCO/VICODIN) 5-325 MG tablet      No current facility-administered medications for this visit.    Facility-Administered Medications Ordered in Other Visits  Medication Dose Route Frequency Provider Last Rate Last Dose  . sodium chloride flush (NS) 0.9 % injection 10 mL  10 mL Intracatheter PRN Curt Bears, MD   10 mL at 11/22/17 1437    SURGICAL HISTORY:  Past Surgical  History:  Procedure Laterality Date  . ANTERIOR CERVICAL DECOMP/DISCECTOMY FUSION  02/06/2004   C5-6, C6-7  . AXILLARY NODE DISSECTION Left 08/2012  . BREAST RECONSTRUCTION  06/2013  . CARPAL TUNNEL RELEASE Right 1989  . CARPAL TUNNEL RELEASE Left 10/02/2013   Procedure: LEFT CARPAL TUNNEL RELEASE;  Surgeon: Linna Hoff, MD;  Location: [facility];  Service: Orthopedics;  Laterality: Left;  Local Anesthesia with IV Sedation  . ENDOBRONCHIAL ULTRASOUND Bilateral 10/27/2017   Procedure: ENDOBRONCHIAL ULTRASOUND;  Surgeon: Marshell Garfinkel, MD;  Location: [facility];  Service: Cardiopulmonary;  Laterality: Bilateral;  . FOOT SURGERY Right    bunion and broke toe and then realign toe  . IR FLUORO GUIDE PORT INSERTION RIGHT  10/25/2017  . IR US GUIDE VASC ACCESS RIGHT  10/25/2017  . MASTECTOMY Bilateral 08/12/2012   LEFT BREAST CANCER  . thoracic back surgery  05/2015  . VAGINAL HYSTERECTOMY  1999    REVIEW OF SYSTEMS:   Review of Systems  Constitutional: Negative for appetite change, chills, fatigue, fever and unexpected weight change.  HENT:   Negative for mouth sores, nosebleeds, sore throat and trouble swallowing.   Eyes: Negative for eye problems and icterus.  Respiratory: Negative for cough, hemoptysis, shortness of breath and wheezing.   Cardiovascular: Negative for chest pain and leg swelling.  Gastrointestinal: Negative for abdominal pain, constipation, diarrhea, nausea and vomiting.  Genitourinary: Negative for bladder incontinence, difficulty urinating, dysuria, frequency and hematuria.   Musculoskeletal: Negative for back pain, gait problem, neck pain and neck stiffness.  Skin: Negative for itching and rash.  Neurological: Negative for dizziness, extremity weakness, gait problem, headaches, light-headedness and seizures.  Hematological: Negative for adenopathy. Does not bruise/bleed easily.  Psychiatric/Behavioral: Negative for confusion, depression and sleep  disturbance. The patient is not nervous/anxious.     PHYSICAL EXAMINATION:  Blood pressure (!) 91/55, pulse (!) 107, temperature 99 F (37.2 C), temperature source Oral, resp. rate 17, weight 122 lb (55.3 kg), SpO2 100 %.  ECOG PERFORMANCE STATUS: 1 - Symptomatic but completely ambulatory  Physical Exam  Constitutional: Oriented to person, place, and time and well-developed, well-nourished, and in no distress. No distress.  HENT:  Head: Normocephalic and atraumatic.  Mouth/Throat: Oropharynx is clear and moist. No oropharyngeal exudate.  Eyes: Conjunctivae are normal. Right eye exhibits no discharge. Left eye exhibits no discharge. No scleral icterus.  Neck: Normal range of motion. Neck supple.  Cardiovascular: Normal rate, regular rhythm, normal heart sounds and intact distal pulses.   Pulmonary/Chest: Effort normal and breath sounds normal. No respiratory distress. No wheezes. No rales.  Abdominal: Soft. Bowel sounds are normal. Exhibits no distension and no mass. There is no tenderness.  Musculoskeletal: Normal range of motion. Exhibits no edema.  Lymphadenopathy:    No cervical adenopathy.  Neurological: Alert and oriented to person, place, and time. Exhibits normal muscle tone. Gait  normal. Coordination normal.  Skin: Skin is warm and dry. No rash noted. Not diaphoretic. No erythema. No pallor.  Psychiatric: Mood, memory and judgment normal.  Vitals reviewed.  LABORATORY DATA: Lab Results  Component Value Date   WBC 4.0 11/22/2017   HGB 11.3 (L) 10/27/2017   HCT 36.4 11/22/2017   MCV 101.4 (H) 11/22/2017   PLT 200 11/22/2017      Chemistry      Component Value Date/Time   NA 136 11/22/2017 0930   K 3.5 11/22/2017 0930   CL 103 11/22/2017 0930   CO2 25 11/22/2017 0930   BUN 18 11/22/2017 0930   CREATININE 0.61 11/22/2017 0930      Component Value Date/Time   CALCIUM 8.6 11/22/2017 0930   ALKPHOS 96 11/22/2017 0930   AST 16 11/22/2017 0930   ALT 27 11/22/2017  0930   BILITOT <0.2 (L) 11/22/2017 0930       RADIOGRAPHIC STUDIES:  Ir US Guide Vasc Access Right  Result Date: 10/25/2017 INDICATION: History of recurrent metastatic breast cancer. In need of durable intravenous access for chemotherapy administration. EXAM: IMPLANTED PORT A CATH PLACEMENT WITH ULTRASOUND AND FLUOROSCOPIC GUIDANCE COMPARISON:  PET-CT - 10/19/2017 MEDICATIONS: Ancef 2 gm IV; The antibiotic was administered within an appropriate time interval prior to skin puncture. ANESTHESIA/SEDATION: Moderate (conscious) sedation was employed during this procedure. A total of Versed 2 mg and Fentanyl 100 mcg was administered intravenously. Moderate Sedation Time: 24 minutes. The patient's level of consciousness and vital signs were monitored continuously by radiology nursing throughout the procedure under my direct supervision. CONTRAST:  None FLUOROSCOPY TIME:  18 seconds (3 mGy) COMPLICATIONS: None immediate. PROCEDURE: The procedure, risks, benefits, and alternatives were explained to the patient. Questions regarding the procedure were encouraged and answered. The patient understands and consents to the procedure. The right neck and chest were prepped with chlorhexidine in a sterile fashion, and a sterile drape was applied covering the operative field. Maximum barrier sterile technique with sterile gowns and gloves were used for the procedure. A timeout was performed prior to the initiation of the procedure. Local anesthesia was provided with 1% lidocaine with epinephrine. After creating a small venotomy incision, a micropuncture kit was utilized to access the internal jugular vein. Real-time ultrasound guidance was utilized for vascular access including the acquisition of a permanent ultrasound image documenting patency of the accessed vessel. The microwire was utilized to measure appropriate catheter length. A subcutaneous port pocket was then created along the upper chest wall utilizing a  combination of sharp and blunt dissection. The pocket was irrigated with sterile saline. A single lumen thin power injectable port was chosen for placement. The 8 Fr catheter was tunneled from the port pocket site to the venotomy incision. The port was placed in the pocket. The external catheter was trimmed to appropriate length. At the venotomy, an 8 Fr peel-away sheath was placed over a guidewire under fluoroscopic guidance. The catheter was then placed through the sheath and the sheath was removed. Final catheter positioning was confirmed and documented with a fluoroscopic spot radiograph. The port was accessed with a Huber needle, aspirated and flushed. The venotomy site was closed with an interrupted 4-0 Vicryl suture. The port pocket incision was closed with interrupted 2-0 Vicryl suture and the skin was opposed with a running subcuticular 4-0 Vicryl suture. Dermabond and Steri-strips were applied to both incisions. Port a Catheter was left accessed for inpatient hospitalization. Dressings were placed. The patient tolerated the procedure well without  immediate post procedural complication. FINDINGS: After catheter placement, the tip lies within the superior cavoatrial junction. The catheter aspirates and flushes normally and is ready for immediate use. IMPRESSION: Successful placement of a right internal jugular approach power injectable Port-A-Cath. Port a Catheter was left accessed and is ready for immediate use. Electronically Signed   By: Sandi Mariscal M.D.   On: 10/25/2017 17:30   Dg Chest Port 1 View  Result Date: 10/27/2017 CLINICAL DATA:  Status post bronchoscopy EXAM: PORTABLE CHEST 1 VIEW COMPARISON:  10/06/2017 FINDINGS: Persistent lingular airspace opacity. Interval biopsy. No pleural effusion or pneumothorax. Right lung is clear. Stable cardiomediastinal silhouette. Right-sided Port-A-Cath in satisfactory position. No acute osseous abnormality. Surgical clips in the left axilla again noted.  IMPRESSION: 1. Persistent lingular airspace opacity with interval biopsy. No pneumothorax. Electronically Signed   By: Kathreen Devoid   On: 10/27/2017 12:43   Ir Cyndy Freeze Guide Port Insertion Right  Result Date: 10/25/2017 INDICATION: History of recurrent metastatic breast cancer. In need of durable intravenous access for chemotherapy administration. EXAM: IMPLANTED PORT A CATH PLACEMENT WITH ULTRASOUND AND FLUOROSCOPIC GUIDANCE COMPARISON:  PET-CT - 10/19/2017 MEDICATIONS: Ancef 2 gm IV; The antibiotic was administered within an appropriate time interval prior to skin puncture. ANESTHESIA/SEDATION: Moderate (conscious) sedation was employed during this procedure. A total of Versed 2 mg and Fentanyl 100 mcg was administered intravenously. Moderate Sedation Time: 24 minutes. The patient's level of consciousness and vital signs were monitored continuously by radiology nursing throughout the procedure under my direct supervision. CONTRAST:  None FLUOROSCOPY TIME:  18 seconds (3 mGy) COMPLICATIONS: None immediate. PROCEDURE: The procedure, risks, benefits, and alternatives were explained to the patient. Questions regarding the procedure were encouraged and answered. The patient understands and consents to the procedure. The right neck and chest were prepped with chlorhexidine in a sterile fashion, and a sterile drape was applied covering the operative field. Maximum barrier sterile technique with sterile gowns and gloves were used for the procedure. A timeout was performed prior to the initiation of the procedure. Local anesthesia was provided with 1% lidocaine with epinephrine. After creating a small venotomy incision, a micropuncture kit was utilized to access the internal jugular vein. Real-time ultrasound guidance was utilized for vascular access including the acquisition of a permanent ultrasound image documenting patency of the accessed vessel. The microwire was utilized to measure appropriate catheter length. A  subcutaneous port pocket was then created along the upper chest wall utilizing a combination of sharp and blunt dissection. The pocket was irrigated with sterile saline. A single lumen thin power injectable port was chosen for placement. The 8 Fr catheter was tunneled from the port pocket site to the venotomy incision. The port was placed in the pocket. The external catheter was trimmed to appropriate length. At the venotomy, an 8 Fr peel-away sheath was placed over a guidewire under fluoroscopic guidance. The catheter was then placed through the sheath and the sheath was removed. Final catheter positioning was confirmed and documented with a fluoroscopic spot radiograph. The port was accessed with a Huber needle, aspirated and flushed. The venotomy site was closed with an interrupted 4-0 Vicryl suture. The port pocket incision was closed with interrupted 2-0 Vicryl suture and the skin was opposed with a running subcuticular 4-0 Vicryl suture. Dermabond and Steri-strips were applied to both incisions. Port a Catheter was left accessed for inpatient hospitalization. Dressings were placed. The patient tolerated the procedure well without immediate post procedural complication. FINDINGS: After catheter placement, the tip  lies within the superior cavoatrial junction. The catheter aspirates and flushes normally and is ready for immediate use. IMPRESSION: Successful placement of a right internal jugular approach power injectable Port-A-Cath. Port a Catheter was left accessed and is ready for immediate use. Electronically Signed   By: Sandi Mariscal M.D.   On: 10/25/2017 17:30   US Abdomen Limited Ruq  Result Date: 10/27/2017 CLINICAL DATA:  Elevated liver function tests. Breast cancer patient. EXAM: ULTRASOUND ABDOMEN LIMITED RIGHT UPPER QUADRANT COMPARISON:  PET scan 10/19/2017 FINDINGS: Gallbladder: No gallstones or wall thickening visualized. No sonographic Murphy sign noted by sonographer. Common bile duct: Diameter:  2.7 mm common normal Liver: No focal lesion identified. Within normal limits in parenchymal echogenicity. Portal vein is patent on color Doppler imaging with normal direction of blood flow towards the liver. IMPRESSION: Normal right upper quadrant ultrasound. No abnormality seen to explain abnormal lab results. Electronically Signed   By: Nelson Chimes M.D.   On: 10/27/2017 10:02     ASSESSMENT/PLAN:  Adenocarcinoma of lung, stage 4 (HCC) This is a very pleasant 52 year old white female with stage IV non-small cell lung cancer, adenocarcinoma presenting with a left middle lobe mass that abuts the mediastinal border/pericardium and the pleural surface.  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 The patient has completed radiation to her brain and left lung mass. She is now here to begin systemic chemotherapy with carboplatin for an AUC of 5, Alimta 500 mg/m, and Avastin 15 mg/kg every 3 weeks.  The patient was seen with Dr. Julien Nordmann.  Discussed results of molecular testing which did not show any actionable mutations.  Recommend that she proceed with systemic chemotherapy as planned.  She will proceed with cycle #1 today. The patient will have weekly labs while on chemotherapy. She will follow-up in 3 weeks for evaluation prior to cycle #2.  For insomnia, she will continue Restoril as needed.  The patient was advised to call immediately if she has any concerning symptoms in the interval. The patient voices understanding of her current disease status and treatment options and is in agreement with the current care plan.  All questions were answered. The patient knows to call the clinic with any problems, questions, or concerns. We can certainly see the patient much sooner if necessary.   No orders of the defined types were placed in this encounter.  Mikey Bussing, DNP, AGPCNP-BC, AOCNP 11/22/17  ADDENDUM: Hematology/Oncology Attending: I had a  face-to-face encounter with the patient.  I recommended her care plan.  This is a very pleasant 52 years old white female recently diagnosed with stage IV non-small cell lung cancer, adenocarcinoma with no actionable mutations and PDL 1 expression of 50%.  Unfortunately the patient is not a good candidate for immunotherapy because of her severe lupus. She completed a course of palliative radiotherapy to the metastatic brain lesions as well as the left lung mass. The patient is feeling much better and she is here today to start the first cycle of her systemic chemotherapy with carboplatin, Alimta and Avastin. I recommended for the patient to proceed with her treatment today as a scheduled.  We reminded the patient of the adverse effect of this treatment. For insomnia she will continue her current treatment with Restoril. We will see her back for follow-up visit in 3 weeks for evaluation before starting cycle #2. The patient was advised to call immediately if she has any concerning symptoms in the interval.  Disclaimer: This  note was dictated with voice recognition software. Similar sounding words can inadvertently be transcribed and may be missed upon review. Eilleen Kempf, MD 11/23/17

## 2017-11-22 NOTE — Telephone Encounter (Signed)
Scheduled appt per 4/1 los - patient to get an updated schedule in the treatment area.

## 2017-11-29 ENCOUNTER — Inpatient Hospital Stay: Payer: 59

## 2017-11-29 DIAGNOSIS — C3490 Malignant neoplasm of unspecified part of unspecified bronchus or lung: Secondary | ICD-10-CM

## 2017-11-29 DIAGNOSIS — C342 Malignant neoplasm of middle lobe, bronchus or lung: Secondary | ICD-10-CM | POA: Diagnosis not present

## 2017-11-29 DIAGNOSIS — Z95828 Presence of other vascular implants and grafts: Secondary | ICD-10-CM

## 2017-11-29 LAB — CBC WITH DIFFERENTIAL (CANCER CENTER ONLY)
BASOS ABS: 0 10*3/uL (ref 0.0–0.1)
BASOS PCT: 0 %
EOS PCT: 0 %
Eosinophils Absolute: 0 10*3/uL (ref 0.0–0.5)
HCT: 34.3 % — ABNORMAL LOW (ref 34.8–46.6)
Hemoglobin: 11.6 g/dL (ref 11.6–15.9)
LYMPHS PCT: 10 %
Lymphs Abs: 0.1 10*3/uL — ABNORMAL LOW (ref 0.9–3.3)
MCH: 33 pg (ref 25.1–34.0)
MCHC: 33.8 g/dL (ref 31.5–36.0)
MCV: 97.7 fL (ref 79.5–101.0)
MONO ABS: 0.1 10*3/uL (ref 0.1–0.9)
Monocytes Relative: 6 %
Neutro Abs: 0.9 10*3/uL — ABNORMAL LOW (ref 1.5–6.5)
Neutrophils Relative %: 84 %
PLATELETS: 126 10*3/uL — AB (ref 145–400)
RBC: 3.51 MIL/uL — AB (ref 3.70–5.45)
RDW: 14.1 % (ref 11.2–14.5)
WBC: 1.1 10*3/uL — AB (ref 3.9–10.3)

## 2017-11-29 LAB — CMP (CANCER CENTER ONLY)
ALBUMIN: 2.8 g/dL — AB (ref 3.5–5.0)
ALK PHOS: 105 U/L (ref 40–150)
ALT: 42 U/L (ref 0–55)
ANION GAP: 8 (ref 3–11)
AST: 40 U/L — AB (ref 5–34)
BILIRUBIN TOTAL: 0.3 mg/dL (ref 0.2–1.2)
BUN: 11 mg/dL (ref 7–26)
CALCIUM: 8.8 mg/dL (ref 8.4–10.4)
CO2: 25 mmol/L (ref 22–29)
Chloride: 100 mmol/L (ref 98–109)
Creatinine: 0.58 mg/dL — ABNORMAL LOW (ref 0.60–1.10)
GFR, Est AFR Am: >60 mL/min (ref >60)
GLUCOSE: 126 mg/dL (ref 70–140)
POTASSIUM: 3.8 mmol/L (ref 3.5–5.1)
Sodium: 133 mmol/L — ABNORMAL LOW (ref 136–145)
TOTAL PROTEIN: 6.4 g/dL (ref 6.4–8.3)

## 2017-11-29 MED ORDER — HEPARIN SOD (PORK) LOCK FLUSH 100 UNIT/ML IV SOLN
500.0000 [IU] | Freq: Once | INTRAVENOUS | Status: AC | PRN
Start: 2017-11-29 — End: 2017-11-29
  Administered 2017-11-29: 500 [IU]
  Filled 2017-11-29: qty 5

## 2017-11-29 MED ORDER — SODIUM CHLORIDE 0.9% FLUSH
10.0000 mL | INTRAVENOUS | Status: DC | PRN
  Administered 2017-11-29: 10 mL
  Filled 2017-11-29: qty 10

## 2017-12-01 ENCOUNTER — Telehealth: Payer: Self-pay | Admitting: Medical Oncology

## 2017-12-01 ENCOUNTER — Inpatient Hospital Stay: Payer: 59

## 2017-12-01 ENCOUNTER — Inpatient Hospital Stay (HOSPITAL_BASED_OUTPATIENT_CLINIC_OR_DEPARTMENT_OTHER): Payer: 59 | Admitting: Medical

## 2017-12-01 ENCOUNTER — Other Ambulatory Visit: Payer: Self-pay | Admitting: Medical Oncology

## 2017-12-01 VITALS — BP 105/69 | HR 94 | Temp 98.2°F | Resp 17 | Ht 62.0 in | Wt 121.1 lb

## 2017-12-01 DIAGNOSIS — C7951 Secondary malignant neoplasm of bone: Secondary | ICD-10-CM | POA: Diagnosis not present

## 2017-12-01 DIAGNOSIS — J0100 Acute maxillary sinusitis, unspecified: Secondary | ICD-10-CM | POA: Diagnosis not present

## 2017-12-01 DIAGNOSIS — C7931 Secondary malignant neoplasm of brain: Secondary | ICD-10-CM | POA: Diagnosis not present

## 2017-12-01 DIAGNOSIS — C771 Secondary and unspecified malignant neoplasm of intrathoracic lymph nodes: Secondary | ICD-10-CM

## 2017-12-01 DIAGNOSIS — Z170 Estrogen receptor positive status [ER+]: Secondary | ICD-10-CM | POA: Diagnosis not present

## 2017-12-01 DIAGNOSIS — Z79899 Other long term (current) drug therapy: Secondary | ICD-10-CM | POA: Diagnosis not present

## 2017-12-01 DIAGNOSIS — B370 Candidal stomatitis: Secondary | ICD-10-CM | POA: Diagnosis not present

## 2017-12-01 DIAGNOSIS — K5900 Constipation, unspecified: Secondary | ICD-10-CM

## 2017-12-01 DIAGNOSIS — C3490 Malignant neoplasm of unspecified part of unspecified bronchus or lung: Secondary | ICD-10-CM

## 2017-12-01 DIAGNOSIS — Z853 Personal history of malignant neoplasm of breast: Secondary | ICD-10-CM | POA: Diagnosis not present

## 2017-12-01 DIAGNOSIS — Z95828 Presence of other vascular implants and grafts: Secondary | ICD-10-CM

## 2017-12-01 DIAGNOSIS — Z9223 Personal history of estrogen therapy: Secondary | ICD-10-CM | POA: Diagnosis not present

## 2017-12-01 DIAGNOSIS — Z9013 Acquired absence of bilateral breasts and nipples: Secondary | ICD-10-CM | POA: Diagnosis not present

## 2017-12-01 DIAGNOSIS — C342 Malignant neoplasm of middle lobe, bronchus or lung: Secondary | ICD-10-CM

## 2017-12-01 DIAGNOSIS — Z923 Personal history of irradiation: Secondary | ICD-10-CM

## 2017-12-01 LAB — CMP (CANCER CENTER ONLY)
ALBUMIN: 2.7 g/dL — AB (ref 3.5–5.0)
ALK PHOS: 125 U/L (ref 40–150)
ALT: 51 U/L (ref 0–55)
ANION GAP: 6 (ref 3–11)
AST: 44 U/L — AB (ref 5–34)
BUN: 12 mg/dL (ref 7–26)
CO2: 26 mmol/L (ref 22–29)
Calcium: 8.7 mg/dL (ref 8.4–10.4)
Chloride: 102 mmol/L (ref 98–109)
Creatinine: 0.55 mg/dL — ABNORMAL LOW (ref 0.60–1.10)
GFR, Est AFR Am: >60 mL/min (ref >60)
GFR, Estimated: >60 mL/min (ref >60)
GLUCOSE: 110 mg/dL (ref 70–140)
POTASSIUM: 3.9 mmol/L (ref 3.5–5.1)
SODIUM: 134 mmol/L — AB (ref 136–145)
Total Bilirubin: 0.3 mg/dL (ref 0.2–1.2)
Total Protein: 6.4 g/dL (ref 6.4–8.3)

## 2017-12-01 LAB — CBC WITH DIFFERENTIAL (CANCER CENTER ONLY)
Basophils Absolute: 0 10*3/uL (ref 0.0–0.1)
Basophils Relative: 0 %
EOS PCT: 0 %
Eosinophils Absolute: 0 10*3/uL (ref 0.0–0.5)
HEMATOCRIT: 34.5 % — AB (ref 34.8–46.6)
Hemoglobin: 11.7 g/dL (ref 11.6–15.9)
LYMPHS PCT: 10 %
Lymphs Abs: 0.1 10*3/uL — ABNORMAL LOW (ref 0.9–3.3)
MCH: 33.4 pg (ref 25.1–34.0)
MCHC: 34 g/dL (ref 31.5–36.0)
MCV: 98.1 fL (ref 79.5–101.0)
MONO ABS: 0.2 10*3/uL (ref 0.1–0.9)
MONOS PCT: 22 %
NEUTROS ABS: 0.8 10*3/uL — AB (ref 1.5–6.5)
Neutrophils Relative %: 68 %
Platelet Count: 117 10*3/uL — ABNORMAL LOW (ref 145–400)
RBC: 3.52 MIL/uL — ABNORMAL LOW (ref 3.70–5.45)
RDW: 15.2 % — AB (ref 11.2–14.5)
WBC Count: 1.1 10*3/uL — ABNORMAL LOW (ref 3.9–10.3)

## 2017-12-01 MED ORDER — SODIUM CHLORIDE 0.9% FLUSH
10.0000 mL | INTRAVENOUS | Status: DC | PRN
  Administered 2017-12-01: 10 mL
  Filled 2017-12-01: qty 10

## 2017-12-01 MED ORDER — FLUCONAZOLE 100 MG PO TABS: 100.0000 mg | ORAL_TABLET | Freq: Every day | ORAL | 0 refills | Status: DC

## 2017-12-01 MED ORDER — HEPARIN SOD (PORK) LOCK FLUSH 100 UNIT/ML IV SOLN
500.0000 [IU] | Freq: Once | INTRAVENOUS | Status: AC | PRN
  Administered 2017-12-01: 500 [IU]
  Filled 2017-12-01: qty 5

## 2017-12-01 MED ORDER — CEFUROXIME AXETIL 250 MG PO TABS: 250.0000 mg | ORAL_TABLET | Freq: Two times a day (BID) | ORAL | 0 refills | Status: DC

## 2017-12-01 NOTE — Telephone Encounter (Signed)
Productive Cough since the weekend. Chills w Yellow sputum, raw mouth, sore in nose. Denies fever. Chemo last week . [place] labs neutrapenic  Appt given.

## 2017-12-01 NOTE — Progress Notes (Signed)
Pt reports gen fatigue, wet cough with yellow sputum, sore throat, headaches, and a sore nose that shows blood when she blows it.  Denies fever/chills or N/V/D.

## 2017-12-01 NOTE — Patient Instructions (Signed)

## 2017-12-03 NOTE — Progress Notes (Signed)
[facility] Progress Note   Diana Schwartz [id number] [birth year] 52 y.o.  Diana Schwartz is managed by Dr. Eilleen Kempf  Actively treated with chemotherapy: yes  Current Therapy: Carboplatin, Alimta, and Avastin  Last Treated: 11/22/2017 (cycle 1, day 1)  Assessment: Plan:    Port-A-Cath in place - Plan: [place], heparin lock flush 100 unit/mL, sodium chloride flush (NS) 0.9 % injection 10 mL  Acute maxillary sinusitis, recurrence not specified - Plan: cefUROXime (CEFTIN) 250 MG tablet, fluconazole (DIFLUCAN) 100 MG tablet  Adenocarcinoma of lung, stage 4, unspecified laterality (HCC)  Oral candidiasis - Plan: fluconazole (DIFLUCAN) 100 MG tablet  Constipation, unspecified constipation type   Acute maxillary sinusitis: The patient was given a prescription for Ceftin 250 mg p.o. twice daily times 10 days.  The patient was also given a prescription for Diflucan 100 mg p.o. once daily times 10 days given the fact that she has been treated recently with nystatin swish and swallow for oral candidiasis.  Stage IV adenocarcinoma of the lung: Diana Schwartz continues to be followed by Dr. Julien Nordmann.  She is status post cycle 1, day 1 of carboplatin, Alimta, and Avastin which was dosed on 11/22/2017.  She will return for labs only on 12/06/2017 and will be seen in follow-up on 12/13/2017.  Oral candidiasis: The patient was also given a prescription for Diflucan 100 mg p.o. once daily times 10 days given the fact that she has been treated recently with nystatin swish and swallow for oral candidiasis and will now be treated with Ceftin for acute sinusitis.   Constipation: Diana Schwartz was told to transition from senna to senna-S and to take 1-2 twice daily.  She was told that she could also use MiraLAX once daily initially as needed should she continue to have constipation despite her use of senna-S.  Please see After Visit Summary for patient  specific instructions.  Future Appointments  Date Time Provider [place]  12/06/2017 11:30 AM [facility] LAB 2 [facility] None  12/06/2017 11:45 AM [facility] INJ NURSE [facility] None  12/13/2017  9:30 AM [facility] ONLY [facility] None  12/13/2017  9:45 AM [facility] FLUSH NURSE [facility] None  12/13/2017 10:15 AM Curt Bears, MD [facility] None  12/13/2017 11:00 AM [facility] D13 [facility] None  12/20/2017 11:30 AM [facility] LAB 6 [facility] None  12/20/2017 11:45 AM [facility] FLUSH NURSE 2 [facility] None  12/21/2017  1:30 PM Bruning, Ashlyn, PA-C [facility] None  12/27/2017 11:30 AM [facility] LAB 5 [facility] None  12/27/2017 11:45 AM [facility] FLUSH NURSE 2 [facility] None  01/03/2018  8:45 AM [facility] LAB 1 [facility] None  01/03/2018  9:00 AM [facility] FLUSH NURSE [facility] None  01/03/2018  9:30 AM Curt Bears, MD [facility] None  01/03/2018 10:15 AM [facility] H29 [facility] None  01/10/2018 11:30 AM [facility] LAB 5 [facility] None  01/10/2018 11:45 AM [facility] FLUSH NURSE 2 [facility] None  01/18/2018 11:30 AM [facility] LAB 4 [facility] None  01/18/2018 11:45 AM [facility] FLUSH NURSE 2 [facility] None  01/24/2018 10:00 AM [facility] LAB 3 [facility] None  01/24/2018 10:15 AM [facility] FLUSH NURSE 2 [facility] None  01/24/2018 10:45 AM Curt Bears, MD [facility] None  01/24/2018 11:45 AM [facility] H31 [facility] None  02/02/2018 10:00 AM Vaslow, Acey Lav, MD [facility] None    Orders Placed This Encounter  Procedures  . SCHEDULING COMMUNICATION INJECTION       Subjective:   Patient ID:  Diana Schwartz is a 52 y.o. (DOB 1965/09/21) female.  Chief Complaint:  Chief Complaint  Patient presents with  . Fatigue    HPI Diana Schwartz is a 52 year old female with a history of a stage IV non-small cell lung cancer, adenocarcinoma.  She presented with a left middle lobe mass which abuts the mediastinal  border, pericardium, and the pleural surface.  She additionally has brain, adrenal, and bone metastasis.  She is status post whole brain radiation and has completed palliative radiation to the left lung.  She is currently treated with carboplatin, Alimta, and Avastin.  She presents to the office today with report of decreased energy, mild constipation, sore throat, productive cough with yellowish sputum, headache, chills, weight loss, facial pressure.  She is recently completed whole brain radiation radiation to a left pulmonary mass.  She has been taking senna once daily for constipation.  Medications: I have reviewed the patient's current medications.  Allergies:  Allergies  Allergen Reactions  . Oxycodone Anaphylaxis, Hives and Swelling    [place]helps and swelling in throat  . Doxycycline Nausea And Vomiting  . Prednisone Nausea And Vomiting and Diarrhea    Can take IV steroids    Past Medical History:  Diagnosis Date  . Anxiety   . Autoimmune hepatitis ([place])    no current med.  . Breast cancer [facility]) 2013   Left breast cancer. Treated at [facility]. Per notes, nvasive lobular carcinoma. S/P bilateral mastectomy with recontruction and Tamoxifen X5 years.   . Carpal tunnel syndrome of left wrist   . Degenerative disc disease, lumbar   . Depression   . Fibromyalgia   . Lupus (systemic lupus erythematosus) ([place])   . Osteoarthritis   . Rash    arms, chest - due to lupus  . Raynaud disease   . Rheumatoid arthritis ([place])   . Sjoegren syndrome     Past Surgical History:  Procedure Laterality Date  . ANTERIOR CERVICAL DECOMP/DISCECTOMY FUSION  02/06/2004   C5-6, C6-7  . AXILLARY NODE DISSECTION Left 08/2012  . BREAST RECONSTRUCTION  06/2013  . CARPAL TUNNEL RELEASE Right 1989  . CARPAL TUNNEL RELEASE Left 10/02/2013   Procedure: LEFT CARPAL TUNNEL RELEASE;  Surgeon: Linna Hoff, MD;  Location: [facility];  Service: Orthopedics;  Laterality: Left;  Local Anesthesia with IV  Sedation  . ENDOBRONCHIAL ULTRASOUND Bilateral 10/27/2017   Procedure: ENDOBRONCHIAL ULTRASOUND;  Surgeon: Marshell Garfinkel, MD;  Location: [facility];  Service: Cardiopulmonary;  Laterality: Bilateral;  . FOOT SURGERY Right    bunion and broke toe and then realign toe  . IR FLUORO GUIDE PORT INSERTION RIGHT  10/25/2017  . IR US GUIDE VASC ACCESS RIGHT  10/25/2017  . MASTECTOMY Bilateral 08/12/2012   LEFT BREAST CANCER  . thoracic back surgery  05/2015  . VAGINAL HYSTERECTOMY  1999    Family History  Problem Relation Age of Onset  . Lung cancer Mother   . Hypertension Mother   . Lung cancer Father   . Rheum arthritis Father   . Hypertension Father   . Colon cancer Neg Hx     Social History   Socioeconomic History  . Marital status: Married    Spouse name: Ronalee Belts  . Number of children: 1  . Years of education: Not on file  . Highest education level: Not on file  Occupational History  . Occupation: disability  Social Needs  . Financial resource strain: Not on file  . Food insecurity:    Worry: Not on file    Inability: Not on  file  . Transportation needs:    Medical: Not on file    Non-medical: Not on file  Tobacco Use  . Smoking status: Former Smoker    Last attempt to quit: 05/24/2012    Years since quitting: 5.5  . Smokeless tobacco: Never Used  Substance and Sexual Activity  . Alcohol use: Yes    Alcohol/week: 0.0 oz    Comment: socially  . Drug use: No  . Sexual activity: Not on file  Lifestyle  . Physical activity:    Days per week: Not on file    Minutes per session: Not on file  . Stress: Not on file  Relationships  . Social connections:    Talks on phone: Not on file    Gets together: Not on file    Attends religious service: Not on file    Active member of club or organization: Not on file    Attends meetings of clubs or organizations: Not on file    Relationship status: Not on file  . Intimate partner violence:    Fear of current or ex partner: Not  on file    Emotionally abused: Not on file    Physically abused: Not on file    Forced sexual activity: Not on file  Other Topics Concern  . Not on file  Social History Narrative  . Not on file    Past Medical History, Surgical history, Social history, and Family history were reviewed and updated as appropriate.   Please see review of systems for further details on the patient's review from today.   Review of Systems:  Review of Systems  Constitutional: Positive for fatigue. Negative for chills, diaphoresis and fever.  HENT: Positive for postnasal drip, sinus pressure, sinus pain and sore throat. Negative for congestion, rhinorrhea and sneezing.   Respiratory: Positive for cough. Negative for shortness of breath.   Neurological: Positive for headaches.    Objective:   Physical Exam:  BP 105/69 (BP Location: Left Arm, Patient Position: Sitting)   Pulse 94   Temp 98.2 F (36.8 C) (Oral)   Resp 17   Ht 5\' 2"  (1.575 m)   Wt 121 lb 1.6 oz (54.9 kg)   SpO2 98%   BMI 22.15 kg/m  ECOG: 1  Physical Exam  Constitutional: No distress.  HENT:  Head: Normocephalic and atraumatic.  Nose: Right sinus exhibits frontal sinus tenderness. Right sinus exhibits no maxillary sinus tenderness. Left sinus exhibits frontal sinus tenderness. Left sinus exhibits no maxillary sinus tenderness.  Mouth/Throat: No oropharyngeal exudate.  Cardiovascular: Normal rate, regular rhythm and normal heart sounds. Exam reveals no gallop and no friction rub.  No murmur heard. Pulmonary/Chest: Effort normal and breath sounds normal. No respiratory distress. She has no wheezes. She has no rales.  Neurological: She is alert.  Skin: Skin is warm and dry. No rash noted. She is not diaphoretic. No erythema.    Lab Review:     Component Value Date/Time   NA 134 (L) 12/01/2017 1402   K 3.9 12/01/2017 1402   CL 102 12/01/2017 1402   CO2 26 12/01/2017 1402   GLUCOSE 110 12/01/2017 1402   BUN 12 12/01/2017  1402   CREATININE 0.55 (L) 12/01/2017 1402   CALCIUM 8.7 12/01/2017 1402   PROT 6.4 12/01/2017 1402   ALBUMIN 2.7 (L) 12/01/2017 1402   AST 44 (H) 12/01/2017 1402   ALT 51 12/01/2017 1402   ALKPHOS 125 12/01/2017 1402   BILITOT 0.3 12/01/2017 1402  GFRNONAA >60 12/01/2017 1402   GFRAA >60 12/01/2017 1402       Component Value Date/Time   WBC 1.1 (L) 12/01/2017 1402   WBC 10.2 10/27/2017 0515   RBC 3.52 (L) 12/01/2017 1402   HGB 11.3 (L) 10/27/2017 0515   HCT 34.5 (L) 12/01/2017 1402   PLT 117 (L) 12/01/2017 1402   MCV 98.1 12/01/2017 1402   MCH 33.4 12/01/2017 1402   MCHC 34.0 12/01/2017 1402   RDW 15.2 (H) 12/01/2017 1402   LYMPHSABS 0.1 (L) 12/01/2017 1402   MONOABS 0.2 12/01/2017 1402   EOSABS 0.0 12/01/2017 1402   BASOSABS 0.0 12/01/2017 1402   -------------------------------  Imaging from last 24 hours (if applicable):  Radiology interpretation: No results found.      This case was discussed with Dr. Julien Nordmann. He expressed agreement with my management of this patient.

## 2017-12-06 ENCOUNTER — Inpatient Hospital Stay: Payer: 59

## 2017-12-06 DIAGNOSIS — C3490 Malignant neoplasm of unspecified part of unspecified bronchus or lung: Secondary | ICD-10-CM

## 2017-12-06 DIAGNOSIS — Z95828 Presence of other vascular implants and grafts: Secondary | ICD-10-CM

## 2017-12-06 DIAGNOSIS — C342 Malignant neoplasm of middle lobe, bronchus or lung: Secondary | ICD-10-CM | POA: Diagnosis not present

## 2017-12-06 LAB — CMP (CANCER CENTER ONLY)
ALK PHOS: 125 U/L (ref 40–150)
ALT: 39 U/L (ref 0–55)
AST: 38 U/L — AB (ref 5–34)
Albumin: 2.8 g/dL — ABNORMAL LOW (ref 3.5–5.0)
Anion gap: 6 (ref 3–11)
BUN: 12 mg/dL (ref 7–26)
CALCIUM: 9 mg/dL (ref 8.4–10.4)
CHLORIDE: 103 mmol/L (ref 98–109)
CO2: 26 mmol/L (ref 22–29)
CREATININE: 0.57 mg/dL — AB (ref 0.60–1.10)
Glucose, Bld: 110 mg/dL (ref 70–140)
Potassium: 4.1 mmol/L (ref 3.5–5.1)
Sodium: 135 mmol/L — ABNORMAL LOW (ref 136–145)
Total Bilirubin: 0.2 mg/dL (ref 0.2–1.2)
Total Protein: 6.6 g/dL (ref 6.4–8.3)

## 2017-12-06 LAB — CBC WITH DIFFERENTIAL (CANCER CENTER ONLY)
BASOS PCT: 0 %
Basophils Absolute: 0 10*3/uL (ref 0.0–0.1)
EOS ABS: 0 10*3/uL (ref 0.0–0.5)
EOS PCT: 0 %
HCT: 33.4 % — ABNORMAL LOW (ref 34.8–46.6)
Hemoglobin: 11.4 g/dL — ABNORMAL LOW (ref 11.6–15.9)
LYMPHS ABS: 0.5 10*3/uL — AB (ref 0.9–3.3)
Lymphocytes Relative: 27 %
MCH: 33.7 pg (ref 25.1–34.0)
MCHC: 34.1 g/dL (ref 31.5–36.0)
MCV: 98.8 fL (ref 79.5–101.0)
Monocytes Absolute: 0.1 10*3/uL (ref 0.1–0.9)
Monocytes Relative: 5 %
Neutro Abs: 1.4 10*3/uL — ABNORMAL LOW (ref 1.5–6.5)
Neutrophils Relative %: 68 %
PLATELETS: 131 10*3/uL — AB (ref 145–400)
RBC: 3.38 MIL/uL — ABNORMAL LOW (ref 3.70–5.45)
RDW: 14.9 % — ABNORMAL HIGH (ref 11.2–14.5)
WBC: 2 10*3/uL — AB (ref 3.9–10.3)

## 2017-12-06 MED ORDER — HEPARIN SOD (PORK) LOCK FLUSH 100 UNIT/ML IV SOLN
500.0000 [IU] | Freq: Once | INTRAVENOUS | Status: AC | PRN
  Administered 2017-12-06: 500 [IU]
  Filled 2017-12-06: qty 5

## 2017-12-06 MED ORDER — SODIUM CHLORIDE 0.9% FLUSH
10.0000 mL | INTRAVENOUS | Status: DC | PRN
  Administered 2017-12-06: 10 mL
  Filled 2017-12-06: qty 10

## 2017-12-06 NOTE — Patient Instructions (Signed)
Implanted Port Home Guide An implanted port is a type of central line that is placed under the skin. Central lines are used to provide IV access when treatment or nutrition needs to be given through a person's veins. Implanted ports are used for long-term IV access. An implanted port may be placed because:  You need IV medicine that would be irritating to the small veins in your hands or arms.  You need long-term IV medicines, such as antibiotics.  You need IV nutrition for a long period.  You need frequent blood draws for lab tests.  You need dialysis.  Implanted ports are usually placed in the chest area, but they can also be placed in the upper arm, the abdomen, or the leg. An implanted port has two main parts:  Reservoir. The reservoir is round and will appear as a small, raised area under your skin. The reservoir is the part where a needle is inserted to give medicines or draw blood.  Catheter. The catheter is a thin, flexible tube that extends from the reservoir. The catheter is placed into a large vein. Medicine that is inserted into the reservoir goes into the catheter and then into the vein.  How will I care for my incision site? Do not get the incision site wet. Bathe or shower as directed by your health care provider. How is my port accessed? Special steps must be taken to access the port:  Before the port is accessed, a numbing cream can be placed on the skin. This helps numb the skin over the port site.  Your health care provider uses a sterile technique to access the port. ? Your health care provider must put on a mask and sterile gloves. ? The skin over your port is cleaned carefully with an antiseptic and allowed to dry. ? The port is gently pinched between sterile gloves, and a needle is inserted into the port.  Only "non-coring" port needles should be used to access the port. Once the port is accessed, a blood return should be checked. This helps ensure that the port  is in the vein and is not clogged.  If your port needs to remain accessed for a constant infusion, a clear (transparent) bandage will be placed over the needle site. The bandage and needle will need to be changed every week, or as directed by your health care provider.  Keep the bandage covering the needle clean and dry. Do not get it wet. Follow your health care provider's instructions on how to take a shower or bath while the port is accessed.  If your port does not need to stay accessed, no bandage is needed over the port.  What is flushing? Flushing helps keep the port from getting clogged. Follow your health care provider's instructions on how and when to flush the port. Ports are usually flushed with saline solution or a medicine called heparin. The need for flushing will depend on how the port is used.  If the port is used for intermittent medicines or blood draws, the port will need to be flushed: ? After medicines have been given. ? After blood has been drawn. ? As part of routine maintenance.  If a constant infusion is running, the port may not need to be flushed.  How long will my port stay implanted? The port can stay in for as long as your health care provider thinks it is needed. When it is time for the port to come out, surgery will be   done to remove it. The procedure is similar to the one performed when the port was put in. When should I seek immediate medical care? When you have an implanted port, you should seek immediate medical care if:  You notice a bad smell coming from the incision site.  You have swelling, redness, or drainage at the incision site.  You have more swelling or pain at the port site or the surrounding area.  You have a fever that is not controlled with medicine.  This information is not intended to replace advice given to you by your health care provider. Make sure you discuss any questions you have with your health care provider. Document  Released: 08/10/2005 Document Revised: 01/16/2016 Document Reviewed: 04/17/2013 Elsevier Interactive Patient Education  2017 Elsevier Inc.  

## 2017-12-13 ENCOUNTER — Inpatient Hospital Stay: Payer: 59

## 2017-12-13 ENCOUNTER — Encounter: Payer: Self-pay | Admitting: Internal Medicine

## 2017-12-13 ENCOUNTER — Telehealth: Payer: Self-pay | Admitting: Internal Medicine

## 2017-12-13 ENCOUNTER — Inpatient Hospital Stay (HOSPITAL_BASED_OUTPATIENT_CLINIC_OR_DEPARTMENT_OTHER): Payer: 59 | Admitting: Internal Medicine

## 2017-12-13 ENCOUNTER — Other Ambulatory Visit: Payer: Self-pay | Admitting: Medical Oncology

## 2017-12-13 VITALS — BP 115/84 | HR 88 | Temp 98.1°F | Resp 12 | Ht 62.0 in | Wt 121.7 lb

## 2017-12-13 DIAGNOSIS — C7951 Secondary malignant neoplasm of bone: Secondary | ICD-10-CM | POA: Diagnosis not present

## 2017-12-13 DIAGNOSIS — Z853 Personal history of malignant neoplasm of breast: Secondary | ICD-10-CM

## 2017-12-13 DIAGNOSIS — C7931 Secondary malignant neoplasm of brain: Secondary | ICD-10-CM | POA: Diagnosis not present

## 2017-12-13 DIAGNOSIS — C342 Malignant neoplasm of middle lobe, bronchus or lung: Secondary | ICD-10-CM | POA: Diagnosis not present

## 2017-12-13 DIAGNOSIS — C3490 Malignant neoplasm of unspecified part of unspecified bronchus or lung: Secondary | ICD-10-CM

## 2017-12-13 DIAGNOSIS — Z9223 Personal history of estrogen therapy: Secondary | ICD-10-CM

## 2017-12-13 DIAGNOSIS — Z170 Estrogen receptor positive status [ER+]: Secondary | ICD-10-CM | POA: Diagnosis not present

## 2017-12-13 DIAGNOSIS — Z79899 Other long term (current) drug therapy: Secondary | ICD-10-CM

## 2017-12-13 DIAGNOSIS — M329 Systemic lupus erythematosus, unspecified: Secondary | ICD-10-CM

## 2017-12-13 DIAGNOSIS — C771 Secondary and unspecified malignant neoplasm of intrathoracic lymph nodes: Secondary | ICD-10-CM | POA: Diagnosis not present

## 2017-12-13 DIAGNOSIS — Z9013 Acquired absence of bilateral breasts and nipples: Secondary | ICD-10-CM | POA: Diagnosis not present

## 2017-12-13 DIAGNOSIS — Z923 Personal history of irradiation: Secondary | ICD-10-CM

## 2017-12-13 DIAGNOSIS — Z5111 Encounter for antineoplastic chemotherapy: Secondary | ICD-10-CM

## 2017-12-13 DIAGNOSIS — Z95828 Presence of other vascular implants and grafts: Secondary | ICD-10-CM

## 2017-12-13 LAB — CMP (CANCER CENTER ONLY)
ALT: 41 U/L (ref 0–55)
AST: 42 U/L — ABNORMAL HIGH (ref 5–34)
Albumin: 3 g/dL — ABNORMAL LOW (ref 3.5–5.0)
Alkaline Phosphatase: 123 U/L (ref 40–150)
Anion gap: 10 (ref 3–11)
BUN: 8 mg/dL (ref 7–26)
CHLORIDE: 103 mmol/L (ref 98–109)
CO2: 22 mmol/L (ref 22–29)
CREATININE: 0.58 mg/dL — AB (ref 0.60–1.10)
Calcium: 9 mg/dL (ref 8.4–10.4)
Glucose, Bld: 90 mg/dL (ref 70–140)
POTASSIUM: 3.7 mmol/L (ref 3.5–5.1)
Sodium: 135 mmol/L — ABNORMAL LOW (ref 136–145)
Total Bilirubin: 0.2 mg/dL (ref 0.2–1.2)
Total Protein: 6.8 g/dL (ref 6.4–8.3)

## 2017-12-13 LAB — CBC WITH DIFFERENTIAL (CANCER CENTER ONLY)
BASOS ABS: 0 10*3/uL (ref 0.0–0.1)
Basophils Relative: 1 %
Eosinophils Absolute: 0 10*3/uL (ref 0.0–0.5)
Eosinophils Relative: 0 %
HCT: 35 % (ref 34.8–46.6)
HEMOGLOBIN: 12 g/dL (ref 11.6–15.9)
LYMPHS ABS: 0.3 10*3/uL — AB (ref 0.9–3.3)
LYMPHS PCT: 8 %
MCH: 34 pg (ref 25.1–34.0)
MCHC: 34.3 g/dL (ref 31.5–36.0)
MCV: 99.1 fL (ref 79.5–101.0)
Monocytes Absolute: 0.7 10*3/uL (ref 0.1–0.9)
Monocytes Relative: 20 %
NEUTROS PCT: 71 %
Neutro Abs: 2.5 10*3/uL (ref 1.5–6.5)
Platelet Count: 276 10*3/uL (ref 145–400)
RBC: 3.53 MIL/uL — AB (ref 3.70–5.45)
RDW: 16.9 % — ABNORMAL HIGH (ref 11.2–14.5)
WBC: 3.6 10*3/uL — AB (ref 3.9–10.3)

## 2017-12-13 MED ORDER — SODIUM CHLORIDE 0.9% FLUSH
10.0000 mL | INTRAVENOUS | Status: DC | PRN
  Administered 2017-12-13: 10 mL
  Filled 2017-12-13: qty 10

## 2017-12-13 MED ORDER — PALONOSETRON HCL INJECTION 0.25 MG/5ML
INTRAVENOUS | Status: AC
  Filled 2017-12-13: qty 5

## 2017-12-13 MED ORDER — SODIUM CHLORIDE 0.9 % IV SOLN
14.6000 mg/kg | Freq: Once | INTRAVENOUS | Status: AC
  Administered 2017-12-13: 800 mg via INTRAVENOUS
  Filled 2017-12-13: qty 32

## 2017-12-13 MED ORDER — HEPARIN SOD (PORK) LOCK FLUSH 100 UNIT/ML IV SOLN
500.0000 [IU] | Freq: Once | INTRAVENOUS | Status: AC | PRN
  Administered 2017-12-13: 500 [IU]
  Filled 2017-12-13: qty 5

## 2017-12-13 MED ORDER — FOSAPREPITANT DIMEGLUMINE INJECTION 150 MG
Freq: Once | INTRAVENOUS | Status: AC
  Administered 2017-12-13: 12:00:00 via INTRAVENOUS
  Filled 2017-12-13: qty 5

## 2017-12-13 MED ORDER — TEMAZEPAM 30 MG PO CAPS: ORAL_CAPSULE | ORAL | 0 refills | Status: DC

## 2017-12-13 MED ORDER — SODIUM CHLORIDE 0.9 % IV SOLN
484.5000 mg | Freq: Once | INTRAVENOUS | Status: AC
  Administered 2017-12-13: 480 mg via INTRAVENOUS
  Filled 2017-12-13: qty 48

## 2017-12-13 MED ORDER — PALONOSETRON HCL INJECTION 0.25 MG/5ML
0.2500 mg | Freq: Once | INTRAVENOUS | Status: AC
  Administered 2017-12-13: 0.25 mg via INTRAVENOUS

## 2017-12-13 MED ORDER — SODIUM CHLORIDE 0.9 % IV SOLN
510.0000 mg/m2 | Freq: Once | INTRAVENOUS | Status: AC
  Administered 2017-12-13: 800 mg via INTRAVENOUS
  Filled 2017-12-13: qty 20

## 2017-12-13 NOTE — Telephone Encounter (Signed)
Appts already scheduled per 4/22 los - no additional appts to add.

## 2017-12-13 NOTE — Progress Notes (Signed)
[facility] [phone]   [phone]  OFFICE PROGRESS NOTE  [name], MD [place] [address]  DIAGNOSIS: Stage IV non-small cell lung cancer, adenocarcinoma presenting with a left middle lobe mass that abuts the mediastinal border/pericardium and the pleural surface. The patient was found to have adrenal and bone metastasis andover 20 brain metastases.  PD-L1 50%  PRIOR THERAPY: Whole brain radiation and palliative radiation to the left lung on 11/10/2017.  CURRENT THERAPY: Systemic chemotherapy with carboplatin for an AUC of 5, Alimta 500 mg/m2, and Avastin 15 mg/kg every 3 weeks. First dose expected on 11/22/2017.  Status post 1 cycle.  INTERVAL HISTORY: Diana Schwartz 52 y.o. female returns to the clinic today for follow-up visit accompanied by her husband.  The patient tolerated the first cycle of her treatment with carboplatin, Alimta and Avastin fairly well.  She denied having any nausea or vomiting.  She has no fever or chills.  She denied having any current chest pain, shortness breath, cough or hemoptysis.  She is currently on Decadron 2 mg p.o. daily.  The patient is here today for evaluation before starting cycle #2.  MEDICAL HISTORY: Past Medical History:  Diagnosis Date  . Anxiety   . Autoimmune hepatitis ([place])    no current med.  . Breast cancer [facility]) 2013   Left breast cancer. Treated at [facility]. Per notes, nvasive lobular carcinoma. S/P bilateral mastectomy with recontruction and Tamoxifen X5 years.   . Carpal tunnel syndrome of left wrist   . Degenerative disc disease, lumbar   . Depression   . Fibromyalgia   . Lupus (systemic lupus erythematosus) ([place])   . Osteoarthritis   . Rash    arms, chest - due to lupus  . Raynaud disease   . Rheumatoid arthritis ([place])   . Sjoegren syndrome     ALLERGIES:  is allergic to oxycodone; doxycycline; and prednisone.  MEDICATIONS:  Current Outpatient  Medications  Medication Sig Dispense Refill  . acetaminophen (TYLENOL) 500 MG tablet Take 1,000 mg by mouth every 6 (six) hours as needed.    . ALPRAZolam (XANAX) 0.5 MG tablet Take 0.5 mg by mouth at bedtime as needed for anxiety.    Marland Kitchen azaTHIOprine (IMURAN) 50 MG tablet Take 50 mg by mouth 2 (two) times daily.    . cefUROXime (CEFTIN) 250 MG tablet Take 1 tablet (250 mg total) by mouth 2 (two) times daily with a meal. 20 tablet 0  . Cholecalciferol (HM VITAMIN D3) 4000 units CAPS Take by mouth.    . dexamethasone (DECADRON) 4 MG tablet Take 1 tablet twice a day the day before, day of, and day after each cycle of chemotherapy. 30 tablet 1  . fluconazole (DIFLUCAN) 100 MG tablet Take 1 tablet (100 mg total) by mouth daily. 10 tablet 0  . folic acid (FOLVITE) 1 MG tablet Take 1 tablet (1 mg total) by mouth daily. 30 tablet 2  . HYDROcodone-acetaminophen (NORCO/VICODIN) 5-325 MG tablet     . hydroxychloroquine (PLAQUENIL) 200 MG tablet Take 200 mg by mouth 2 (two) times daily.    Marland Kitchen levETIRAcetam (KEPPRA) 500 MG tablet Take 1 tablet (500 mg total) by mouth 2 (two) times daily. 60 tablet 5  . lidocaine-prilocaine (EMLA) cream Apply 1 application topically as needed. 30 g 0  . nystatin (MYCOSTATIN) 100000 UNIT/ML suspension     . omeprazole (PRILOSEC) 40 MG capsule Take by mouth daily.  3  .  pantoprazole (PROTONIX) 40 MG tablet Take 1 tablet (40 mg total) by mouth daily. (Patient not taking: Reported on 12/01/2017) 30 tablet 0  . prochlorperazine (COMPAZINE) 10 MG tablet Take 1 tablet (10 mg total) by mouth every 6 (six) hours as needed for nausea or vomiting. (Patient not taking: Reported on 12/01/2017) 30 tablet 1  . temazepam (RESTORIL) 15 MG capsule Take two capsules (15 mg) by mouth at bedtime as needed for sleep.  Do not take with Xanax (alprazolam) 60 capsule 0  . venlafaxine XR (EFFEXOR-XR) 150 MG 24 hr capsule Take 2 capsules by mouth daily with breakfast.      No current facility-administered  medications for this visit.     SURGICAL HISTORY:  Past Surgical History:  Procedure Laterality Date  . ANTERIOR CERVICAL DECOMP/DISCECTOMY FUSION  02/06/2004   C5-6, C6-7  . AXILLARY NODE DISSECTION Left 08/2012  . BREAST RECONSTRUCTION  06/2013  . CARPAL TUNNEL RELEASE Right 1989  . CARPAL TUNNEL RELEASE Left 10/02/2013   Procedure: LEFT CARPAL TUNNEL RELEASE;  Surgeon: Linna Hoff, MD;  Location: [facility];  Service: Orthopedics;  Laterality: Left;  Local Anesthesia with IV Sedation  . ENDOBRONCHIAL ULTRASOUND Bilateral 10/27/2017   Procedure: ENDOBRONCHIAL ULTRASOUND;  Surgeon: Marshell Garfinkel, MD;  Location: [facility];  Service: Cardiopulmonary;  Laterality: Bilateral;  . FOOT SURGERY Right    bunion and broke toe and then realign toe  . IR FLUORO GUIDE PORT INSERTION RIGHT  10/25/2017  . IR US GUIDE VASC ACCESS RIGHT  10/25/2017  . MASTECTOMY Bilateral 08/12/2012   LEFT BREAST CANCER  . thoracic back surgery  05/2015  . VAGINAL HYSTERECTOMY  1999    REVIEW OF SYSTEMS:  A comprehensive review of systems was negative except for: Constitutional: positive for fatigue   PHYSICAL EXAMINATION: General appearance: alert, cooperative, fatigued and no distress Head: Normocephalic, without obvious abnormality, atraumatic Neck: no adenopathy, no JVD, supple, symmetrical, trachea midline and thyroid not enlarged, symmetric, no tenderness/mass/nodules Lymph nodes: Cervical, supraclavicular, and axillary nodes normal. Resp: clear to auscultation bilaterally Back: symmetric, no curvature. ROM normal. No CVA tenderness. Cardio: regular rate and rhythm, S1, S2 normal, no murmur, click, rub or gallop GI: soft, non-tender; bowel sounds normal; no masses,  no organomegaly Extremities: extremities normal, atraumatic, no cyanosis or edema  ECOG PERFORMANCE STATUS: 1 - Symptomatic but completely ambulatory  Blood pressure 115/84, pulse 88, temperature 98.1 F (36.7 C), temperature  source Oral, resp. rate 12, height _0  (1.575 m), weight 121 lb 11.2 oz (55.2 kg), SpO2 100 %.  LABORATORY DATA: Lab Results  Component Value Date   WBC 2.0 (L) 12/06/2017   HGB 11.3 (L) 10/27/2017   HCT 33.4 (L) 12/06/2017   MCV 98.8 12/06/2017   PLT 131 (L) 12/06/2017      Chemistry      Component Value Date/Time   NA 135 (L) 12/06/2017 1148   K 4.1 12/06/2017 1148   CL 103 12/06/2017 1148   CO2 26 12/06/2017 1148   BUN 12 12/06/2017 1148   CREATININE 0.57 (L) 12/06/2017 1148      Component Value Date/Time   CALCIUM 9.0 12/06/2017 1148   ALKPHOS 125 12/06/2017 1148   AST 38 (H) 12/06/2017 1148   ALT 39 12/06/2017 1148   BILITOT 0.2 12/06/2017 1148       RADIOGRAPHIC STUDIES: No results found.  ASSESSMENT AND PLAN: This is a very pleasant 52 years old white female with metastatic non-small cell lung cancer, adenocarcinoma  with no actionable mutations.  PDL 1 expression is 50% but the patient has significant lupus.  She is status post whole brain irradiation.  She is currently on treatment with systemic chemotherapy with carboplatin, Alimta and Avastin status post 1 cycle. The patient continues to tolerate her treatment well with no concerning complaints.  I recommended for her to proceed with cycle #2 today as a scheduled. We will see her back for follow-up visit in 3 weeks for evaluation before the next cycle of her treatment. For the maintenance Decadron, I recommended for the patient to decrease her dose of Decadron to 2 mg every other day for 1 week before stopping it. For insomnia, I gave the patient refill for Restoril 30 mg p.o. Nightly. She was advised to call immediately if she has any concerning symptoms in the interval. The patient voices understanding of current disease status and treatment options and is in agreement with the current care plan.  All questions were answered. The patient knows to call the clinic with any problems, questions or concerns. We can  certainly see the patient much sooner if necessary.  I spent 10 minutes counseling the patient face to face. The total time spent in the appointment was 15 minutes.  Disclaimer: This note was dictated with voice recognition software. Similar sounding words can inadvertently be transcribed and may not be corrected upon review.

## 2017-12-13 NOTE — Patient Instructions (Signed)
[facility] Discharge Instructions for Patients Receiving Chemotherapy  Today you received the following chemotherapy agents Avastin, Alimta, Carboplatin  To help prevent nausea and vomiting after your treatment, we encourage you to take your nausea medication as directed   If you develop nausea and vomiting that is not controlled by your nausea medication, call the clinic.   BELOW ARE SYMPTOMS THAT SHOULD BE REPORTED IMMEDIATELY:  *FEVER GREATER THAN 100.5 F  *CHILLS WITH OR WITHOUT FEVER  NAUSEA AND VOMITING THAT IS NOT CONTROLLED WITH YOUR NAUSEA MEDICATION  *UNUSUAL SHORTNESS OF BREATH  *UNUSUAL BRUISING OR BLEEDING  TENDERNESS IN MOUTH AND THROAT WITH OR WITHOUT PRESENCE OF ULCERS  *URINARY PROBLEMS  *BOWEL PROBLEMS  UNUSUAL RASH Items with * indicate a potential emergency and should be followed up as soon as possible.  Feel free to call the clinic should you have any questions or concerns. The clinic phone number is [phone].  Please show the [facility] at check-in to the Emergency Department and triage nurse.

## 2017-12-15 ENCOUNTER — Other Ambulatory Visit: Payer: Self-pay | Admitting: Internal Medicine

## 2017-12-15 ENCOUNTER — Telehealth: Payer: Self-pay | Admitting: Radiation Oncology

## 2017-12-15 MED ORDER — DEXAMETHASONE 2 MG PO TABS: 2.0000 mg | ORAL_TABLET | Freq: Every day | ORAL | 0 refills | Status: DC

## 2017-12-15 MED ORDER — LEVETIRACETAM 1000 MG PO TABS: 1000.0000 mg | ORAL_TABLET | Freq: Two times a day (BID) | ORAL | 5 refills | Status: DC

## 2017-12-15 NOTE — Telephone Encounter (Signed)
Received voicemail message from patient's husband requesting refill of his wife's seizure medication. Phoned Michael back. Explained his request for refill had been forward to Dr. Renda Rolls nurse, Ander Purpura. Explained Lauren will be in touch with him to clarify frequency and amount. Also, he had question about decadron taper. Reinforced that Lauren could help with that as well. Provided Lauren with update and she expressed her intent to follow up with Dr. Julien Nordmann and the patient's husband.

## 2017-12-20 ENCOUNTER — Inpatient Hospital Stay: Payer: 59

## 2017-12-20 DIAGNOSIS — C342 Malignant neoplasm of middle lobe, bronchus or lung: Secondary | ICD-10-CM | POA: Diagnosis not present

## 2017-12-20 DIAGNOSIS — C3490 Malignant neoplasm of unspecified part of unspecified bronchus or lung: Secondary | ICD-10-CM

## 2017-12-20 DIAGNOSIS — Z95828 Presence of other vascular implants and grafts: Secondary | ICD-10-CM

## 2017-12-20 LAB — CMP (CANCER CENTER ONLY)
ALK PHOS: 150 U/L (ref 40–150)
ALT: 47 U/L (ref 0–55)
AST: 47 U/L — AB (ref 5–34)
Albumin: 3.1 g/dL — ABNORMAL LOW (ref 3.5–5.0)
Anion gap: 7 (ref 3–11)
BUN: 11 mg/dL (ref 7–26)
CALCIUM: 9.3 mg/dL (ref 8.4–10.4)
CHLORIDE: 102 mmol/L (ref 98–109)
CO2: 26 mmol/L (ref 22–29)
CREATININE: 0.59 mg/dL — AB (ref 0.60–1.10)
GFR, Est AFR Am: >60 mL/min (ref >60)
Glucose, Bld: 111 mg/dL (ref 70–140)
Potassium: 3.7 mmol/L (ref 3.5–5.1)
SODIUM: 135 mmol/L — AB (ref 136–145)
Total Bilirubin: 0.3 mg/dL (ref 0.2–1.2)
Total Protein: 7.1 g/dL (ref 6.4–8.3)

## 2017-12-20 LAB — CBC WITH DIFFERENTIAL (CANCER CENTER ONLY)
BASOS ABS: 0 10*3/uL (ref 0.0–0.1)
Basophils Relative: 1 %
EOS PCT: 1 %
Eosinophils Absolute: 0 10*3/uL (ref 0.0–0.5)
HCT: 34.7 % — ABNORMAL LOW (ref 34.8–46.6)
Hemoglobin: 11.8 g/dL (ref 11.6–15.9)
LYMPHS ABS: 0.5 10*3/uL — AB (ref 0.9–3.3)
LYMPHS PCT: 40 %
MCH: 33.5 pg (ref 25.1–34.0)
MCHC: 34 g/dL (ref 31.5–36.0)
MCV: 98.6 fL (ref 79.5–101.0)
Monocytes Absolute: 0.1 10*3/uL (ref 0.1–0.9)
Monocytes Relative: 7 %
NEUTROS PCT: 51 %
Neutro Abs: 0.7 10*3/uL — ABNORMAL LOW (ref 1.5–6.5)
PLATELETS: 195 10*3/uL (ref 145–400)
RBC: 3.52 MIL/uL — AB (ref 3.70–5.45)
RDW: 15 % — ABNORMAL HIGH (ref 11.2–14.5)
WBC: 1.4 10*3/uL — AB (ref 3.9–10.3)

## 2017-12-20 LAB — TOTAL PROTEIN, URINE DIPSTICK: PROTEIN: NEGATIVE mg/dL

## 2017-12-20 MED ORDER — SODIUM CHLORIDE 0.9% FLUSH
10.0000 mL | INTRAVENOUS | Status: DC | PRN
  Administered 2017-12-20: 10 mL
  Filled 2017-12-20: qty 10

## 2017-12-20 MED ORDER — HEPARIN SOD (PORK) LOCK FLUSH 100 UNIT/ML IV SOLN
500.0000 [IU] | Freq: Once | INTRAVENOUS | Status: AC | PRN
  Administered 2017-12-20: 500 [IU]
  Filled 2017-12-20: qty 5

## 2017-12-21 ENCOUNTER — Ambulatory Visit
Admission: RE | Admit: 2017-12-21 | Discharge: 2017-12-21 | Disposition: A | Discharge status: Home / Self Care | Payer: 59 | Source: Ambulatory Visit | Attending: Urology | Admitting: Urology

## 2017-12-21 ENCOUNTER — Other Ambulatory Visit: Payer: Self-pay

## 2017-12-21 ENCOUNTER — Encounter: Payer: Self-pay | Admitting: Urology

## 2017-12-21 VITALS — BP 103/63 | HR 115 | Temp 98.2°F | Resp 16 | Ht 62.0 in | Wt 116.0 lb

## 2017-12-21 DIAGNOSIS — G40909 Epilepsy, unspecified, not intractable, without status epilepticus: Secondary | ICD-10-CM | POA: Insufficient documentation

## 2017-12-21 DIAGNOSIS — C3412 Malignant neoplasm of upper lobe, left bronchus or lung: Secondary | ICD-10-CM | POA: Insufficient documentation

## 2017-12-21 DIAGNOSIS — C7931 Secondary malignant neoplasm of brain: Secondary | ICD-10-CM | POA: Diagnosis not present

## 2017-12-21 DIAGNOSIS — R531 Weakness: Secondary | ICD-10-CM | POA: Diagnosis not present

## 2017-12-21 DIAGNOSIS — R51 Headache: Secondary | ICD-10-CM | POA: Insufficient documentation

## 2017-12-21 DIAGNOSIS — Z79899 Other long term (current) drug therapy: Secondary | ICD-10-CM | POA: Diagnosis not present

## 2017-12-21 DIAGNOSIS — Z923 Personal history of irradiation: Secondary | ICD-10-CM | POA: Diagnosis not present

## 2017-12-21 DIAGNOSIS — R110 Nausea: Secondary | ICD-10-CM | POA: Insufficient documentation

## 2017-12-21 NOTE — Progress Notes (Signed)
Radiation Oncology         [phone] ________________________________  Name: Diana Schwartz MRN: [id number]  Date: 12/21/2017  DOB: 08/17/1966  Post Treatment Note  CC: [name], MD  Curt Bears, MD  Diagnosis:   52 y.o. female with left upper lung cancer and 22 brain metastases     Interval Since Last Radiation:  5 weeks  10/28/2017 - 11/10/2017: 1. Left Lung / 30 Gy in 10 fractions 2. Whole Brain / 30 Gy in 10 fractions  Narrative:  The patient returns today for routine follow-up. She tolerated radiation treatment relatively well.  She experienced moderate fatigue and had some alopecia noted in the treatment field. She reported headaches, nausea, and vomiting occasionally before treatment. She denied any visual or auditory changes, although she felt that her cognitive and motor functions were slower. She reported some short-term memory loss, slurred speech, unsteady gait, and difficulty with holding objects. She did experience several seizures during the course of treatment and was started on Keppra 500mg  po BID on 11/02/17, increased to 1000mg  po BID on 11/03/17 and continued throughout treatment.   Her Decadron dose was tapered from 4 mg QID to 4 mg BID which she continued throughout her course of radiation. She reported a cough with occasional blood as well as dysphasia and odynophagia. She otherwise reported a good appetite and was able to maintain her normal weight. Skin changes included erythema to her chest without desquamation.  Decadron was tapered to 4mg  qd x 2 weeks, beginning 11/16/17, and then 1/2 tablet (2mg ) qd to continue until follow up 12/21/17.                            On review of systems, the patient states that she is doing fairly well.  She has been extremely tired over the past 2-3 weeks and thought this was secondary to starting systemic therapy.  She denies any increased shortness of breath, chest pain, increased cough or hemoptysis.  She reports  occasional mild headaches but denies any issues with imbalance or dizziness. She continues with muffled hearing and difficulty concentrating- particularly having difficulty balancing her checkbook which is frustrating.  She denies diplopia or blurry vision.  She has continued taking Keppra 1000mg  po BID as prescribed and tolerates this well. She has not had any further seizure activity and denies any focal weakness, numbness or tingling.  She denies bowel or bladder incontinence.  She started taking Decadron 2mg  po QOD last week, at the direction of Dr. Julien Nordmann and discontinued as of Saturday 12/18/17.   She was started on systemic chemotherapy with Carboplatin, Alimta and Avastin on 11/22/2017, now status post 2 cycles and tolerating well.   ALLERGIES:  is allergic to oxycodone; doxycycline; and prednisone.  Meds: Current Outpatient Medications  Medication Sig Dispense Refill  . azaTHIOprine (IMURAN) 50 MG tablet Take 50 mg by mouth 2 (two) times daily.    . Cholecalciferol (HM VITAMIN D3) 4000 units CAPS Take by mouth.    . folic acid (FOLVITE) 1 MG tablet Take 1 tablet (1 mg total) by mouth daily. 30 tablet 2  . hydroxychloroquine (PLAQUENIL) 200 MG tablet Take 200 mg by mouth 2 (two) times daily.    Marland Kitchen levETIRAcetam (KEPPRA) 1000 MG tablet Take 1 tablet (1,000 mg total) by mouth 2 (two) times daily. 60 tablet 5  . lidocaine-prilocaine (EMLA) cream Apply 1 application topically as needed. 30 g 0  .  omeprazole (PRILOSEC) 40 MG capsule Take by mouth daily.  3  . prochlorperazine (COMPAZINE) 10 MG tablet Take 1 tablet (10 mg total) by mouth every 6 (six) hours as needed for nausea or vomiting. 30 tablet 1  . senna-docusate (SENNA S) 8.6-50 MG tablet Take 1 tablet by mouth daily.    . temazepam (RESTORIL) 30 MG capsule Take 1 capsule 30 mg) by mouth at bedtime as needed for sleep.  Do not take with Xanax (alprazolam) 30 capsule 0  . venlafaxine XR (EFFEXOR-XR) 150 MG 24 hr capsule Take 2 capsules by  mouth daily with breakfast.     . acetaminophen (TYLENOL) 500 MG tablet Take 1,000 mg by mouth every 6 (six) hours as needed.    Marland Kitchen dexamethasone (DECADRON) 2 MG tablet Take 1 tablet (2 mg total) by mouth daily. (Patient not taking: Reported on 12/21/2017) 30 tablet 0  . dexamethasone (DECADRON) 4 MG tablet Take 1 tablet twice a day the day before, day of, and day after each cycle of chemotherapy. (Patient not taking: Reported on 12/13/2017) 30 tablet 1  . HYDROcodone-acetaminophen (NORCO/VICODIN) 5-325 MG tablet     . nystatin (MYCOSTATIN) 100000 UNIT/ML suspension      No current facility-administered medications for this encounter.     Physical Findings:  height is 5\' 2"  (1.575 m) and weight is 116 lb (52.6 kg). Her temperature is 98.2 F (36.8 C). Her blood pressure is 103/63 and her pulse is 115 (abnormal). Her respiration is 16 and oxygen saturation is 100%.  Pain Assessment Pain Score: 5  Pain Loc: Head/10 In general this is a well appearing Caucasian female in no acute distress.  She's alert and oriented x4 and appropriate throughout the examination. Cardiopulmonary assessment is negative for acute distress and she exhibits normal effort.  Lungs are clear to auscultation bilaterally.  Sensation remains intact to light touch in the upper and lower extremities bilaterally.  Strength is 5/5 and equal bilaterally in the upper and lower extremities.  She has a hoarse voice but no evidence of thrush on exam.  Otherwise, there are no gross neurologic deficits.  Lab Findings: Lab Results  Component Value Date   WBC 1.4 (L) 12/20/2017   HGB 11.8 12/20/2017   HCT 34.7 (L) 12/20/2017   MCV 98.6 12/20/2017   PLT 195 12/20/2017     Radiographic Findings: No results found.  Impression/Plan: 49. 52 y.o. 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 She appears to have recovered well from the effects of radiotherapy.  She will continue taking  Keppra 1000 mg p.o. twice daily until her scheduled follow-up visit with Dr. Mickeal Skinner on 02/02/18 and any adjustments to her regimen will be discussed at that time.  She is currently tolerating her systemic chemotherapy well and will continue this under the care and direction of Dr. Earlie Server.  We will schedule a repeat brain MRI in approximately 2 months time to assess for treatment response  We will see her back in the office thereafter to review results.  Pending this scan shows stable disease, we will plan to move forward with repeat surveillance brain MRI scans every 3 months for the first year to assess for disease progression or recurrence. Her scans will be reviewed in the multidisciplinary brain conference prior to each of her subsequent follow-up office visits.  She is comfortable with and in agreement with this plan.    Nicholos Johns, PA-C

## 2017-12-27 ENCOUNTER — Encounter: Payer: Self-pay | Admitting: Medical

## 2017-12-27 ENCOUNTER — Inpatient Hospital Stay: Payer: 59

## 2017-12-27 ENCOUNTER — Inpatient Hospital Stay (HOSPITAL_BASED_OUTPATIENT_CLINIC_OR_DEPARTMENT_OTHER): Payer: 59 | Admitting: Medical

## 2017-12-27 ENCOUNTER — Inpatient Hospital Stay: Payer: 59 | Attending: Internal Medicine

## 2017-12-27 VITALS — BP 101/70 | HR 98 | Temp 98.9°F | Resp 16

## 2017-12-27 VITALS — BP 89/67 | HR 116 | Temp 98.0°F | Resp 16

## 2017-12-27 DIAGNOSIS — Z5111 Encounter for antineoplastic chemotherapy: Secondary | ICD-10-CM | POA: Insufficient documentation

## 2017-12-27 DIAGNOSIS — Z923 Personal history of irradiation: Secondary | ICD-10-CM | POA: Insufficient documentation

## 2017-12-27 DIAGNOSIS — E860 Dehydration: Secondary | ICD-10-CM | POA: Diagnosis not present

## 2017-12-27 DIAGNOSIS — R630 Anorexia: Secondary | ICD-10-CM

## 2017-12-27 DIAGNOSIS — C7931 Secondary malignant neoplasm of brain: Secondary | ICD-10-CM | POA: Diagnosis not present

## 2017-12-27 DIAGNOSIS — Z95828 Presence of other vascular implants and grafts: Secondary | ICD-10-CM | POA: Diagnosis not present

## 2017-12-27 DIAGNOSIS — R531 Weakness: Secondary | ICD-10-CM | POA: Diagnosis not present

## 2017-12-27 DIAGNOSIS — M329 Systemic lupus erythematosus, unspecified: Secondary | ICD-10-CM | POA: Insufficient documentation

## 2017-12-27 DIAGNOSIS — C342 Malignant neoplasm of middle lobe, bronchus or lung: Secondary | ICD-10-CM | POA: Diagnosis present

## 2017-12-27 DIAGNOSIS — C3490 Malignant neoplasm of unspecified part of unspecified bronchus or lung: Secondary | ICD-10-CM

## 2017-12-27 DIAGNOSIS — C7970 Secondary malignant neoplasm of unspecified adrenal gland: Secondary | ICD-10-CM | POA: Insufficient documentation

## 2017-12-27 DIAGNOSIS — G4700 Insomnia, unspecified: Secondary | ICD-10-CM | POA: Insufficient documentation

## 2017-12-27 DIAGNOSIS — C7951 Secondary malignant neoplasm of bone: Secondary | ICD-10-CM | POA: Diagnosis not present

## 2017-12-27 LAB — CBC WITH DIFFERENTIAL (CANCER CENTER ONLY)
Basophils Absolute: 0 10*3/uL (ref 0.0–0.1)
Basophils Relative: 0 %
EOS ABS: 0 10*3/uL (ref 0.0–0.5)
EOS PCT: 1 %
HCT: 34.1 % — ABNORMAL LOW (ref 34.8–46.6)
Hemoglobin: 11.6 g/dL (ref 11.6–15.9)
Lymphocytes Relative: 12 %
Lymphs Abs: 0.3 10*3/uL — ABNORMAL LOW (ref 0.9–3.3)
MCH: 33.7 pg (ref 25.1–34.0)
MCHC: 34.1 g/dL (ref 31.5–36.0)
MCV: 98.8 fL (ref 79.5–101.0)
MONO ABS: 0.6 10*3/uL (ref 0.1–0.9)
Monocytes Relative: 23 %
Neutro Abs: 1.8 10*3/uL (ref 1.5–6.5)
Neutrophils Relative %: 64 %
PLATELETS: 167 10*3/uL (ref 145–400)
RBC: 3.45 MIL/uL — ABNORMAL LOW (ref 3.70–5.45)
RDW: 16.8 % — AB (ref 11.2–14.5)
WBC: 2.8 10*3/uL — AB (ref 3.9–10.3)

## 2017-12-27 LAB — CMP (CANCER CENTER ONLY)
ALBUMIN: 3.4 g/dL — AB (ref 3.5–5.0)
ALT: 23 U/L (ref 0–55)
AST: 29 U/L (ref 5–34)
Alkaline Phosphatase: 132 U/L (ref 40–150)
Anion gap: 6 (ref 3–11)
BILIRUBIN TOTAL: 0.4 mg/dL (ref 0.2–1.2)
BUN: 16 mg/dL (ref 7–26)
CHLORIDE: 100 mmol/L (ref 98–109)
CO2: 26 mmol/L (ref 22–29)
CREATININE: 0.63 mg/dL (ref 0.60–1.10)
Calcium: 9.4 mg/dL (ref 8.4–10.4)
GFR, Est AFR Am: >60 mL/min (ref >60)
GLUCOSE: 90 mg/dL (ref 70–140)
POTASSIUM: 3.6 mmol/L (ref 3.5–5.1)
Sodium: 132 mmol/L — ABNORMAL LOW (ref 136–145)
Total Protein: 7.3 g/dL (ref 6.4–8.3)

## 2017-12-27 MED ORDER — SODIUM CHLORIDE 0.9% FLUSH
10.0000 mL | INTRAVENOUS | Status: DC | PRN
  Administered 2017-12-27: 10 mL
  Filled 2017-12-27: qty 10

## 2017-12-27 MED ORDER — DEXAMETHASONE 2 MG PO TABS: 2.0000 mg | ORAL_TABLET | Freq: Two times a day (BID) | ORAL | 0 refills | Status: AC

## 2017-12-27 MED ORDER — HEPARIN SOD (PORK) LOCK FLUSH 100 UNIT/ML IV SOLN
500.0000 [IU] | Freq: Once | INTRAVENOUS | Status: AC | PRN
  Administered 2017-12-27: 500 [IU]
  Filled 2017-12-27: qty 5

## 2017-12-27 MED ORDER — SODIUM CHLORIDE 0.9 % IV SOLN
INTRAVENOUS | Status: AC
  Administered 2017-12-27: 12:00:00 via INTRAVENOUS

## 2017-12-27 NOTE — Patient Instructions (Signed)
Dehydration, Adult Dehydration is when there is not enough fluid or water in your body. This happens when you lose more fluids than you take in. Dehydration can range from mild to very bad. It should be treated right away to keep it from getting very bad. Symptoms of mild dehydration may include:  Thirst.  Dry lips.  Slightly dry mouth.  Dry, warm skin.  Dizziness. Symptoms of moderate dehydration may include:  Very dry mouth.  Muscle cramps.  Dark pee (urine). Pee may be the color of tea.  Your body making less pee.  Your eyes making fewer tears.  Heartbeat that is uneven or faster than normal (palpitations).  Headache.  Light-headedness, especially when you stand up from sitting.  Fainting (syncope). Symptoms of very bad dehydration may include:  Changes in skin, such as: ? Cold and clammy skin. ? Blotchy (mottled) or pale skin. ? Skin that does not quickly return to normal after being lightly pinched and let go (poor skin turgor).  Changes in body fluids, such as: ? Feeling very thirsty. ? Your eyes making fewer tears. ? Not sweating when body temperature is high, such as in hot weather. ? Your body making very little pee.  Changes in vital signs, such as: ? Weak pulse. ? Pulse that is more than 100 beats a minute when you are sitting still. ? Fast breathing. ? Low blood pressure.  Other changes, such as: ? Sunken eyes. ? Cold hands and feet. ? Confusion. ? Lack of energy (lethargy). ? Trouble waking up from sleep. ? Short-term weight loss. ? Unconsciousness. Follow these instructions at home:  If told by your doctor, drink an ORS: ? Make an ORS by using instructions on the package. ? Start by drinking small amounts, about  cup (120 mL) every 5-10 minutes. ? Slowly drink more until you have had the amount that your doctor said to have.  Drink enough clear fluid to keep your pee clear or pale yellow. If you were told to drink an ORS, finish the ORS  first, then start slowly drinking clear fluids. Drink fluids such as: ? Water. Do not drink only water by itself. Doing that can make the salt (sodium) level in your body get too low (hyponatremia). ? Ice chips. ? Fruit juice that you have added water to (diluted). ? Low-calorie sports drinks.  Avoid: ? Alcohol. ? Drinks that have a lot of sugar. These include high-calorie sports drinks, fruit juice that does not have water added, and soda. ? Caffeine. ? Foods that are greasy or have a lot of fat or sugar.  Take over-the-counter and prescription medicines only as told by your doctor.  Do not take salt tablets. Doing that can make the salt level in your body get too high (hypernatremia).  Eat foods that have minerals (electrolytes). Examples include bananas, oranges, potatoes, tomatoes, and spinach.  Keep all follow-up visits as told by your doctor. This is important. Contact a doctor if:  You have belly (abdominal) pain that: ? Gets worse. ? Stays in one area (localizes).  You have a rash.  You have a stiff neck.  You get angry or annoyed more easily than normal (irritability).  You are more sleepy than normal.  You have a harder time waking up than normal.  You feel: ? Weak. ? Dizzy. ? Very thirsty.  You have peed (urinated) only a small amount of very dark pee during 6-8 hours. Get help right away if:  You have symptoms of   very bad dehydration.  You cannot drink fluids without throwing up (vomiting).  Your symptoms get worse with treatment.  You have a fever.  You have a very bad headache.  You are throwing up or having watery poop (diarrhea) and it: ? Gets worse. ? Does not go away.  You have blood or something green (bile) in your throw-up.  You have blood in your poop (stool). This may cause poop to look black and tarry.  You have not peed in 6-8 hours.  You pass out (faint).  Your heart rate when you are sitting still is more than 100 beats a  minute.  You have trouble breathing. This information is not intended to replace advice given to you by your health care provider. Make sure you discuss any questions you have with your health care provider. Document Released: 06/06/2009 Document Revised: 02/28/2016 Document Reviewed: 10/04/2015 Elsevier Interactive Patient Education  2018 [employer]vier Inc.  

## 2017-12-27 NOTE — Progress Notes (Signed)
Patient husband reports increase weakness and lack of appetite for the past week. Only intake has been an Ensure once in the morning. Patient taken to [facility]. Report given to [place], [place]

## 2017-12-28 NOTE — Progress Notes (Signed)
Symptoms Management Clinic Progress Note   Diana Schwartz [id number] 04/05/1966 52 y.o.  Diana Schwartz is managed by Dr. Fanny Bien. Diana Schwartz  Actively treated with chemotherapy: yes  Current Therapy: Carboplatin, Alimta, and Avastin  Last Treated:  12/13/2017 (cycle 2, day 1)  Assessment: Plan:    Weakness - Plan: dexamethasone (DECADRON) 2 MG tablet, 0.9 %  sodium chloride infusion  Anorexia - Plan: dexamethasone (DECADRON) 2 MG tablet  Adenocarcinoma of lung, stage 4, unspecified laterality ([place]) - Plan: 0.9 %  sodium chloride infusion  Port-A-Cath in place - Plan: heparin lock flush 100 unit/mL, sodium chloride flush (NS) 0.9 % injection 10 mL   Weakness and anorexia: This was discussed with Dr. Julien Nordmann who agrees to restart the patient on Decadron 2 mg.  He suggests giving 2 mg twice daily however based on the fact that the patient has had some insomnia I have recommended once daily dosing to begin.  The patient was also given 1 L of normal saline IV.  A complete blood count was collected today and returned showing a WBC of 2.8, ANC of 1.8, hemoglobin 11.6, hematocrit 34.1, and platelet of 167.  A chemistry panel returned showing a sodium of 132 and an albumin of 3.4.  Otherwise the chemistry panel was within normal limits.  Stage IV adenocarcinoma of the lung: The patient is status post cycle 2, day 1 of carboplatin, Alimta, and Avastin which was dosed on 12/13/2017.  Please see After Visit Summary for patient specific instructions.  Future Appointments  Date Time Provider [place]  01/03/2018  8:45 AM [facility] LAB 1 [facility] None  01/03/2018  9:00 AM [facility] FLUSH NURSE [facility] None  01/03/2018  9:30 AM Curt Bears, MD [facility] None  01/03/2018 10:15 AM [facility] H29 [facility] None  01/10/2018 11:30 AM [facility] LAB 5 [facility] None  01/10/2018 11:45 AM [facility] FLUSH NURSE 2 [facility] None  01/18/2018 11:30 AM [facility] LAB  4 [facility] None  01/18/2018 11:45 AM [facility] FLUSH NURSE 2 [facility] None  01/24/2018 10:00 AM [facility] LAB 3 [facility] None  01/24/2018 10:15 AM [facility] FLUSH NURSE 2 [facility] None  01/24/2018 10:45 AM Curt Bears, MD [facility] None  01/24/2018 11:45 AM [facility] H31 [facility] None  02/02/2018 10:00 AM Vaslow, Acey Lav, MD [facility] None    No orders of the defined types were placed in this encounter.      Subjective:   Patient ID:  Diana Schwartz is a 52 y.o. (DOB 1966/02/23) female.  Chief Complaint:  Chief Complaint  Patient presents with  . Dehydration    HPI Diana Schwartz is a 52 year old female with a diagnosis of a stage IV adenocarcinoma of the lung who is managed by Dr. Julien Nordmann.  She is status post cycle 2, day 1 of Avastin, Alimta, and carboplatin.  She had been on Decadron daily.  This was tapered to every other day dosing and was stopped 1-1/2 weeks ago.  Since that time she has had a significant decrease in her energy and an increase in anorexia.  She is is drinking 1 protein shake in the morning.  Otherwise she has a very little p.o. intake.  She was last seen by Dr. Fanny Bien. Diana Schwartz on 12/13/2017.  She had a complete blood count and a chemistry panel completed today.  Medications: I have reviewed the patient's current medications.  Allergies:  Allergies  Allergen Reactions  . Oxycodone Anaphylaxis, Hives and Swelling    Whelps and swelling in throat  .  Doxycycline Nausea And Vomiting  . Prednisone Nausea And Vomiting and Diarrhea    Can take IV steroids    Past Medical History:  Diagnosis Date  . Anxiety   . Autoimmune hepatitis ([place])    no current med.  . Breast cancer [facility]) 2013   Left breast cancer. Treated at [facility]. Per notes, nvasive lobular carcinoma. S/P bilateral mastectomy with recontruction and Tamoxifen X5 years.   . Carpal tunnel syndrome of left wrist   . Degenerative disc disease, lumbar   . Depression   .  Fibromyalgia   . Lupus (systemic lupus erythematosus) ([place])   . Osteoarthritis   . Rash    arms, chest - due to lupus  . Raynaud disease   . Rheumatoid arthritis ([facility])   . Sjoegren syndrome     Past Surgical History:  Procedure Laterality Date  . ANTERIOR CERVICAL DECOMP/DISCECTOMY FUSION  02/06/2004   C5-6, C6-7  . AXILLARY NODE DISSECTION Left 08/2012  . BREAST RECONSTRUCTION  06/2013  . CARPAL TUNNEL RELEASE Right 1989  . CARPAL TUNNEL RELEASE Left 10/02/2013   Procedure: LEFT CARPAL TUNNEL RELEASE;  Surgeon: Linna Hoff, MD;  Location[facility];  Service: Orthopedics;  Laterality: Left;  Local Anesthesia with IV Sedation  . ENDOBRONCHIAL ULTRASOUND Bilateral 10/27/2017   Procedure: ENDOBRONCHIAL ULTRASOUND;  Surgeon: Marshell Garfinkel, MD;  Location: [facility];  Service: Cardiopulmonary;  Laterality: Bilateral;  . FOOT SURGERY Right    bunion and broke toe and then realign toe  . IR FLUORO GUIDE PORT INSERTION RIGHT  10/25/2017  . IR US GUIDE VASC ACCESS RIGHT  10/25/2017  . MASTECTOMY Bilateral 08/12/2012   LEFT BREAST CANCER  . thoracic back surgery  05/2015  . VAGINAL HYSTERECTOMY  1999    Family History  Problem Relation Age of Onset  . Lung cancer Mother   . Hypertension Mother   . Lung cancer Father   . Rheum arthritis Father   . Hypertension Father   . Colon cancer Neg Hx     Social History   Socioeconomic History  . Marital status: Married    Spouse name: Ronalee Belts  . Number of children: 1  . Years of education: Not on file  . Highest education level: Not on file  Occupational History  . Occupation: disability  Social Needs  . Financial resource strain: Not on file  . Food insecurity:    Worry: Not on file    Inability: Not on file  . Transportation needs:    Medical: Not on file    Non-medical: Not on file  Tobacco Use  . Smoking status: Former Smoker    Last attempt to quit: 05/24/2012    Years since quitting: 5.6  . Smokeless  tobacco: Never Used  Substance and Sexual Activity  . Alcohol use: Yes    Alcohol/week: 0.0 oz    Comment: socially  . Drug use: No  . Sexual activity: Not on file  Lifestyle  . Physical activity:    Days per week: Not on file    Minutes per session: Not on file  . Stress: Not on file  Relationships  . Social connections:    Talks on phone: Not on file    Gets together: Not on file    Attends religious service: Not on file    Active member of club or organization: Not on file    Attends meetings of clubs or organizations: Not on file    Relationship status: Not  on file  . Intimate partner violence:    Fear of current or ex partner: Not on file    Emotionally abused: Not on file    Physically abused: Not on file    Forced sexual activity: Not on file  Other Topics Concern  . Not on file  Social History Narrative  . Not on file    Past Medical History, Surgical history, Social history, and Family history were reviewed and updated as appropriate.   Please see review of systems for further details on the patient's review from today.   Review of Systems:  Review of Systems  Constitutional: Positive for appetite change, chills, fatigue and unexpected weight change. Negative for diaphoresis.  HENT: Negative for trouble swallowing.   Respiratory: Negative for cough, choking, chest tightness, shortness of breath and wheezing.   Cardiovascular: Negative for chest pain and palpitations.  Gastrointestinal: Negative for nausea and vomiting.  Musculoskeletal: Negative for back pain.  Skin: Negative for color change and rash.  Neurological: Positive for weakness. Negative for dizziness, light-headedness and headaches.    Objective:   Physical Exam:  BP 101/70 (BP Location: Left Arm, Patient Position: Sitting) Comment: post IVF  Pulse 98   Temp 98.9 F (37.2 C) (Oral)   Resp 16   SpO2 100%  ECOG: 1  Physical Exam  Constitutional: No distress.  The patient is a chronically  ill-appearing adult female who appears to be fatigued but in no acute distress.  HENT:  Head: Normocephalic and atraumatic.  Cardiovascular: Regular rhythm, S1 normal and S2 normal. Tachycardia present. Exam reveals no gallop and no friction rub.  No murmur heard. Pulmonary/Chest: Effort normal and breath sounds normal. No respiratory distress. She has no wheezes. She has no rales.  Neurological: She is alert.  Skin: Skin is warm and dry. No rash noted. She is not diaphoretic. No erythema.    Lab Review:     Component Value Date/Time   NA 132 (L) 12/27/2017 1055   K 3.6 12/27/2017 1055   CL 100 12/27/2017 1055   CO2 26 12/27/2017 1055   GLUCOSE 90 12/27/2017 1055   BUN 16 12/27/2017 1055   CREATININE 0.63 12/27/2017 1055   CALCIUM 9.4 12/27/2017 1055   PROT 7.3 12/27/2017 1055   ALBUMIN 3.4 (L) 12/27/2017 1055   AST 29 12/27/2017 1055   ALT 23 12/27/2017 1055   ALKPHOS 132 12/27/2017 1055   BILITOT 0.4 12/27/2017 1055   GFRNONAA >60 12/27/2017 1055   GFRAA >60 12/27/2017 1055       Component Value Date/Time   WBC 2.8 (L) 12/27/2017 1055   WBC 10.2 10/27/2017 0515   RBC 3.45 (L) 12/27/2017 1055   HGB 11.6 12/27/2017 1055   HCT 34.1 (L) 12/27/2017 1055   PLT 167 12/27/2017 1055   MCV 98.8 12/27/2017 1055   MCH 33.7 12/27/2017 1055   MCHC 34.1 12/27/2017 1055   RDW 16.8 (H) 12/27/2017 1055   LYMPHSABS 0.3 (L) 12/27/2017 1055   MONOABS 0.6 12/27/2017 1055   EOSABS 0.0 12/27/2017 1055   BASOSABS 0.0 12/27/2017 1055   -------------------------------  Imaging from last 24 hours (if applicable):  Radiology interpretation: No results found.      This case was discussed with Dr. Julien Nordmann. He expressed agreement with my management of this patient.

## 2018-01-03 ENCOUNTER — Inpatient Hospital Stay: Payer: 59

## 2018-01-03 ENCOUNTER — Encounter: Payer: Self-pay | Admitting: *Deleted

## 2018-01-03 ENCOUNTER — Inpatient Hospital Stay (HOSPITAL_BASED_OUTPATIENT_CLINIC_OR_DEPARTMENT_OTHER): Payer: 59 | Admitting: Internal Medicine

## 2018-01-03 ENCOUNTER — Encounter: Payer: Self-pay | Admitting: Internal Medicine

## 2018-01-03 ENCOUNTER — Telehealth: Payer: Self-pay | Admitting: Internal Medicine

## 2018-01-03 VITALS — BP 90/63

## 2018-01-03 DIAGNOSIS — R630 Anorexia: Secondary | ICD-10-CM

## 2018-01-03 DIAGNOSIS — C7951 Secondary malignant neoplasm of bone: Secondary | ICD-10-CM | POA: Diagnosis not present

## 2018-01-03 DIAGNOSIS — R531 Weakness: Secondary | ICD-10-CM

## 2018-01-03 DIAGNOSIS — Z5111 Encounter for antineoplastic chemotherapy: Secondary | ICD-10-CM | POA: Diagnosis not present

## 2018-01-03 DIAGNOSIS — M329 Systemic lupus erythematosus, unspecified: Secondary | ICD-10-CM

## 2018-01-03 DIAGNOSIS — C7931 Secondary malignant neoplasm of brain: Secondary | ICD-10-CM

## 2018-01-03 DIAGNOSIS — Z923 Personal history of irradiation: Secondary | ICD-10-CM | POA: Diagnosis not present

## 2018-01-03 DIAGNOSIS — C342 Malignant neoplasm of middle lobe, bronchus or lung: Secondary | ICD-10-CM | POA: Diagnosis not present

## 2018-01-03 DIAGNOSIS — C3490 Malignant neoplasm of unspecified part of unspecified bronchus or lung: Secondary | ICD-10-CM

## 2018-01-03 DIAGNOSIS — G4700 Insomnia, unspecified: Secondary | ICD-10-CM

## 2018-01-03 DIAGNOSIS — Z95828 Presence of other vascular implants and grafts: Secondary | ICD-10-CM

## 2018-01-03 DIAGNOSIS — C7970 Secondary malignant neoplasm of unspecified adrenal gland: Secondary | ICD-10-CM | POA: Diagnosis not present

## 2018-01-03 LAB — CMP (CANCER CENTER ONLY)
ALBUMIN: 3.5 g/dL (ref 3.5–5.0)
ALK PHOS: 112 U/L (ref 40–150)
ALT: 22 U/L (ref 0–55)
ANION GAP: 7 (ref 3–11)
AST: 30 U/L (ref 5–34)
BUN: 7 mg/dL (ref 7–26)
CHLORIDE: 104 mmol/L (ref 98–109)
CO2: 25 mmol/L (ref 22–29)
Calcium: 9.2 mg/dL (ref 8.4–10.4)
Creatinine: 0.61 mg/dL (ref 0.60–1.10)
GFR, Estimated: >60 mL/min (ref >60)
GLUCOSE: 83 mg/dL (ref 70–140)
POTASSIUM: 3.5 mmol/L (ref 3.5–5.1)
SODIUM: 136 mmol/L (ref 136–145)
Total Bilirubin: 0.3 mg/dL (ref 0.2–1.2)
Total Protein: 7.1 g/dL (ref 6.4–8.3)

## 2018-01-03 LAB — CBC WITH DIFFERENTIAL (CANCER CENTER ONLY)
Basophils Absolute: 0 10*3/uL (ref 0.0–0.1)
Basophils Relative: 1 %
EOS ABS: 0.1 10*3/uL (ref 0.0–0.5)
EOS PCT: 2 %
HCT: 35.3 % (ref 34.8–46.6)
HEMOGLOBIN: 12.1 g/dL (ref 11.6–15.9)
Lymphocytes Relative: 12 %
Lymphs Abs: 0.4 10*3/uL — ABNORMAL LOW (ref 0.9–3.3)
MCH: 34.2 pg — ABNORMAL HIGH (ref 25.1–34.0)
MCHC: 34.1 g/dL (ref 31.5–36.0)
MCV: 100.2 fL (ref 79.5–101.0)
MONOS PCT: 18 %
Monocytes Absolute: 0.5 10*3/uL (ref 0.1–0.9)
NEUTROS PCT: 67 %
Neutro Abs: 2 10*3/uL (ref 1.5–6.5)
PLATELETS: 274 10*3/uL (ref 145–400)
RBC: 3.52 MIL/uL — ABNORMAL LOW (ref 3.70–5.45)
RDW: 17.4 % — AB (ref 11.2–14.5)
WBC Count: 3 10*3/uL — ABNORMAL LOW (ref 3.9–10.3)

## 2018-01-03 MED ORDER — PALONOSETRON HCL INJECTION 0.25 MG/5ML
INTRAVENOUS | Status: AC
  Filled 2018-01-03: qty 5

## 2018-01-03 MED ORDER — SODIUM CHLORIDE 0.9% FLUSH
10.0000 mL | INTRAVENOUS | Status: DC | PRN
  Administered 2018-01-03: 10 mL
  Filled 2018-01-03: qty 10

## 2018-01-03 MED ORDER — SODIUM CHLORIDE 0.9 % IV SOLN
Freq: Once | INTRAVENOUS | Status: AC
  Administered 2018-01-03: 11:00:00 via INTRAVENOUS

## 2018-01-03 MED ORDER — PALONOSETRON HCL INJECTION 0.25 MG/5ML
0.2500 mg | Freq: Once | INTRAVENOUS | Status: AC
  Administered 2018-01-03: 0.25 mg via INTRAVENOUS

## 2018-01-03 MED ORDER — FOSAPREPITANT DIMEGLUMINE INJECTION 150 MG
Freq: Once | INTRAVENOUS | Status: AC
  Administered 2018-01-03: 12:00:00 via INTRAVENOUS
  Filled 2018-01-03: qty 5

## 2018-01-03 MED ORDER — BEVACIZUMAB CHEMO INJECTION 400 MG/16ML
800.0000 mg | Freq: Once | INTRAVENOUS | Status: AC
  Administered 2018-01-03: 800 mg via INTRAVENOUS
  Filled 2018-01-03: qty 32

## 2018-01-03 MED ORDER — SODIUM CHLORIDE 0.9 % IV SOLN
484.5000 mg | Freq: Once | INTRAVENOUS | Status: AC
  Administered 2018-01-03: 480 mg via INTRAVENOUS
  Filled 2018-01-03: qty 48

## 2018-01-03 MED ORDER — HEPARIN SOD (PORK) LOCK FLUSH 100 UNIT/ML IV SOLN
500.0000 [IU] | Freq: Once | INTRAVENOUS | Status: AC | PRN
  Administered 2018-01-03: 500 [IU]
  Filled 2018-01-03: qty 5

## 2018-01-03 MED ORDER — SODIUM CHLORIDE 0.9 % IV SOLN
800.0000 mg | Freq: Once | INTRAVENOUS | Status: AC
  Administered 2018-01-03: 800 mg via INTRAVENOUS
  Filled 2018-01-03: qty 20

## 2018-01-03 MED ORDER — CYANOCOBALAMIN 1000 MCG/ML IJ SOLN
INTRAMUSCULAR | Status: AC
  Filled 2018-01-03: qty 1

## 2018-01-03 MED ORDER — CYANOCOBALAMIN 1000 MCG/ML IJ SOLN
1000.0000 ug | Freq: Once | INTRAMUSCULAR | Status: AC
  Administered 2018-01-03: 1000 ug via INTRAMUSCULAR

## 2018-01-03 NOTE — Patient Instructions (Signed)
[facility] Discharge Instructions for Patients Receiving Chemotherapy  Today you received the following chemotherapy agents Avastin, Alimta, Carboplatin  To help prevent nausea and vomiting after your treatment, we encourage you to take your nausea medication as directed   If you develop nausea and vomiting that is not controlled by your nausea medication, call the clinic.   BELOW ARE SYMPTOMS THAT SHOULD BE REPORTED IMMEDIATELY:  *FEVER GREATER THAN 100.5 F  *CHILLS WITH OR WITHOUT FEVER  NAUSEA AND VOMITING THAT IS NOT CONTROLLED WITH YOUR NAUSEA MEDICATION  *UNUSUAL SHORTNESS OF BREATH  *UNUSUAL BRUISING OR BLEEDING  TENDERNESS IN MOUTH AND THROAT WITH OR WITHOUT PRESENCE OF ULCERS  *URINARY PROBLEMS  *BOWEL PROBLEMS  UNUSUAL RASH Items with * indicate a potential emergency and should be followed up as soon as possible.  Feel free to call the clinic should you have any questions or concerns. The clinic phone number is [phone].  Please show the [facility] at check-in to the Emergency Department and triage nurse.

## 2018-01-03 NOTE — Progress Notes (Signed)
Oncology Nurse Navigator Documentation  Oncology Nurse Navigator Flowsheets 01/03/2018  Navigator Location [facility]  Navigator Encounter Type Clinic/MDC/I spoke with patient today. She has recently lost weight. I spoke with patient and she would like to see nutritionist.  Dr. Julien Nordmann is aware and referral made.   Patient Visit Type MedOnc  Acuity Level 2 2  Time Spent with Patient 30

## 2018-01-03 NOTE — Progress Notes (Signed)
[facility] [phone]   [phone]  OFFICE PROGRESS NOTE  [name], MD [place] [address]  DIAGNOSIS: Stage IV non-small cell lung cancer, adenocarcinoma presenting with a left middle lobe mass that abuts the mediastinal border/pericardium and the pleural surface. The patient was found to have adrenal and bone metastasis andover 20 brain metastases.  PD-L1 50%  PRIOR THERAPY: Whole brain radiation and palliative radiation to the left lung on 11/10/2017.  CURRENT THERAPY: Systemic chemotherapy with carboplatin for an AUC of 5, Alimta 500 mg/m2, and Avastin 15 mg/kg every 3 weeks. First dose expected on 11/22/2017.  Status post 2 cycles.  INTERVAL HISTORY: Diana Schwartz 52 y.o. female returns to the clinic today for follow-up visit accompanied by her husband.  The patient is feeling fine today with no specific complaints except for fatigue and lack of appetite.  She lost around 9 pounds since her last visit.  She resumed her treatment with Decadron 2 mg p.o. daily.  She denied having any chest pain, shortness of breath, cough or hemoptysis.  She continues to have hoarseness of her voice.  She denied having any fever or chills.  She has intermittent nausea but no significant vomiting, diarrhea or constipation.  She is here for evaluation before starting cycle #3 of her chemotherapy.   MEDICAL HISTORY: Past Medical History:  Diagnosis Date  . Anxiety   . Autoimmune hepatitis ([place])    no current med.  . Breast cancer [facility]) 2013   Left breast cancer. Treated at [facility]. Per notes, nvasive lobular carcinoma. S/P bilateral mastectomy with recontruction and Tamoxifen X5 years.   . Carpal tunnel syndrome of left wrist   . Degenerative disc disease, lumbar   . Depression   . Fibromyalgia   . Lupus (systemic lupus erythematosus) ([place])   . Osteoarthritis   . Rash    arms, chest - due to lupus  . Raynaud disease   .  Rheumatoid arthritis ([place])   . Sjoegren syndrome     ALLERGIES:  is allergic to oxycodone; doxycycline; and prednisone.  MEDICATIONS:  Current Outpatient Medications  Medication Sig Dispense Refill  . acetaminophen (TYLENOL) 500 MG tablet Take 1,000 mg by mouth every 6 (six) hours as needed.    Marland Kitchen azaTHIOprine (IMURAN) 50 MG tablet Take 50 mg by mouth 2 (two) times daily.    . Cholecalciferol (HM VITAMIN D3) 4000 units CAPS Take by mouth.    . dexamethasone (DECADRON) 2 MG tablet Take 1 tablet (2 mg total) by mouth 2 (two) times daily. 60 tablet 0  . dexamethasone (DECADRON) 4 MG tablet Take 1 tablet twice a day the day before, day of, and day after each cycle of chemotherapy. (Patient not taking: Reported on 12/13/2017) 30 tablet 1  . folic acid (FOLVITE) 1 MG tablet Take 1 tablet (1 mg total) by mouth daily. 30 tablet 2  . HYDROcodone-acetaminophen (NORCO/VICODIN) 5-325 MG tablet     . hydroxychloroquine (PLAQUENIL) 200 MG tablet Take 200 mg by mouth 2 (two) times daily.    Marland Kitchen levETIRAcetam (KEPPRA) 1000 MG tablet Take 1 tablet (1,000 mg total) by mouth 2 (two) times daily. 60 tablet 5  . lidocaine-prilocaine (EMLA) cream Apply 1 application topically as needed. 30 g 0  . nystatin (MYCOSTATIN) 100000 UNIT/ML suspension     . omeprazole (PRILOSEC) 40 MG capsule Take by mouth daily.  3  . prochlorperazine (COMPAZINE) 10 MG tablet Take 1  tablet (10 mg total) by mouth every 6 (six) hours as needed for nausea or vomiting. 30 tablet 1  . senna-docusate (SENNA S) 8.6-50 MG tablet Take 1 tablet by mouth daily.    . temazepam (RESTORIL) 30 MG capsule Take 1 capsule 30 mg) by mouth at bedtime as needed for sleep.  Do not take with Xanax (alprazolam) 30 capsule 0  . venlafaxine XR (EFFEXOR-XR) 150 MG 24 hr capsule Take 2 capsules by mouth daily with breakfast.      No current facility-administered medications for this visit.     SURGICAL HISTORY:  Past Surgical History:  Procedure Laterality Date    . ANTERIOR CERVICAL DECOMP/DISCECTOMY FUSION  02/06/2004   C5-6, C6-7  . AXILLARY NODE DISSECTION Left 08/2012  . BREAST RECONSTRUCTION  06/2013  . CARPAL TUNNEL RELEASE Right 1989  . CARPAL TUNNEL RELEASE Left 10/02/2013   Procedure: LEFT CARPAL TUNNEL RELEASE;  Surgeon: Linna Hoff, MD;  Location: [facility];  Service: Orthopedics;  Laterality: Left;  Local Anesthesia with IV Sedation  . ENDOBRONCHIAL ULTRASOUND Bilateral 10/27/2017   Procedure: ENDOBRONCHIAL ULTRASOUND;  Surgeon: Marshell Garfinkel, MD;  Location: [facility];  Service: Cardiopulmonary;  Laterality: Bilateral;  . FOOT SURGERY Right    bunion and broke toe and then realign toe  . IR FLUORO GUIDE PORT INSERTION RIGHT  10/25/2017  . IR US GUIDE VASC ACCESS RIGHT  10/25/2017  . MASTECTOMY Bilateral 08/12/2012   LEFT BREAST CANCER  . thoracic back surgery  05/2015  . VAGINAL HYSTERECTOMY  1999    REVIEW OF SYSTEMS:  A comprehensive review of systems was negative except for: Constitutional: positive for anorexia, fatigue and weight loss Ears, nose, mouth, throat, and face: positive for hoarseness   PHYSICAL EXAMINATION: General appearance: alert, cooperative, fatigued and no distress Head: Normocephalic, without obvious abnormality, atraumatic Neck: no adenopathy, no JVD, supple, symmetrical, trachea midline and thyroid not enlarged, symmetric, no tenderness/mass/nodules Lymph nodes: Cervical, supraclavicular, and axillary nodes normal. Resp: clear to auscultation bilaterally Back: symmetric, no curvature. ROM normal. No CVA tenderness. Cardio: regular rate and rhythm, S1, S2 normal, no murmur, click, rub or gallop GI: soft, non-tender; bowel sounds normal; no masses,  no organomegaly Extremities: extremities normal, atraumatic, no cyanosis or edema  ECOG PERFORMANCE STATUS: 1 - Symptomatic but completely ambulatory  Blood pressure 103/73, pulse 94, temperature 98.4 F (36.9 C), temperature source Oral, resp.  rate 18, height 5' 2"  (1.575 m), weight 112 lb 14.4 oz (51.2 kg), SpO2 100 %.  LABORATORY DATA: Lab Results  Component Value Date   WBC 3.0 (L) 01/03/2018   HGB 12.1 01/03/2018   HCT 35.3 01/03/2018   MCV 100.2 01/03/2018   PLT 274 01/03/2018      Chemistry      Component Value Date/Time   NA 132 (L) 12/27/2017 1055   K 3.6 12/27/2017 1055   CL 100 12/27/2017 1055   CO2 26 12/27/2017 1055   BUN 16 12/27/2017 1055   CREATININE 0.63 12/27/2017 1055      Component Value Date/Time   CALCIUM 9.4 12/27/2017 1055   ALKPHOS 132 12/27/2017 1055   AST 29 12/27/2017 1055   ALT 23 12/27/2017 1055   BILITOT 0.4 12/27/2017 1055       RADIOGRAPHIC STUDIES: No results found.  ASSESSMENT AND PLAN: This is a very pleasant 52 years old white female with metastatic non-small cell lung cancer, adenocarcinoma with no actionable mutations.  PDL 1 expression is 50% but the patient  has significant lupus.  She is status post whole brain irradiation.  She is currently on treatment with systemic chemotherapy with carboplatin, Alimta and Avastin status post 2 cycles. She tolerated the second cycle of her treatment well except for fatigue in the last few days.  She also lost around 9 pounds since her last visit. I recommended for the patient to proceed with cycle #3 today as a scheduled.  I will see her back for follow-up visit in 3 weeks for evaluation after repeating CT scan of the chest, abdomen and pelvis for restaging of her disease. For the lack of appetite and fatigue, I recommended for the patient to resume her treatment with Decadron 4 mg p.o. daily. For insomnia, I gave the patient refill for Restoril 30 mg p.o. Nightly. She was advised to call immediately if she has any concerning symptoms in the interval. The patient voices understanding of current disease status and treatment options and is in agreement with the current care plan.  All questions were answered. The patient knows to call the  clinic with any problems, questions or concerns. We can certainly see the patient much sooner if necessary.  I spent 10 minutes counseling the patient face to face. The total time spent in the appointment was 15 minutes.  Disclaimer: This note was dictated with voice recognition software. Similar sounding words can inadvertently be transcribed and may not be corrected upon review.

## 2018-01-03 NOTE — Telephone Encounter (Signed)
Gave patient husband avs report and appointments for May thru July. Central radiology will call re scan. Moved 6/12 f/w with Dr. Mickeal Skinner to 6/10 to coordinate with weekly lab and keep patient from making multiple trips in the same week.

## 2018-01-04 ENCOUNTER — Other Ambulatory Visit: Payer: Self-pay | Admitting: Radiation Therapy

## 2018-01-04 DIAGNOSIS — C7931 Secondary malignant neoplasm of brain: Secondary | ICD-10-CM

## 2018-01-04 DIAGNOSIS — C7949 Secondary malignant neoplasm of other parts of nervous system: Principal | ICD-10-CM

## 2018-01-10 ENCOUNTER — Other Ambulatory Visit: Payer: Self-pay | Admitting: Medical Oncology

## 2018-01-10 ENCOUNTER — Other Ambulatory Visit: Payer: Self-pay

## 2018-01-10 ENCOUNTER — Inpatient Hospital Stay: Payer: 59

## 2018-01-10 ENCOUNTER — Telehealth: Payer: Self-pay

## 2018-01-10 ENCOUNTER — Other Ambulatory Visit: Payer: Self-pay | Admitting: Internal Medicine

## 2018-01-10 DIAGNOSIS — Z5111 Encounter for antineoplastic chemotherapy: Secondary | ICD-10-CM | POA: Diagnosis not present

## 2018-01-10 DIAGNOSIS — C3490 Malignant neoplasm of unspecified part of unspecified bronchus or lung: Secondary | ICD-10-CM

## 2018-01-10 DIAGNOSIS — Z95828 Presence of other vascular implants and grafts: Secondary | ICD-10-CM

## 2018-01-10 LAB — CMP (CANCER CENTER ONLY)
ALBUMIN: 3.5 g/dL (ref 3.5–5.0)
ALT: 27 U/L (ref 0–55)
ANION GAP: 8 (ref 3–11)
AST: 34 U/L (ref 5–34)
Alkaline Phosphatase: 104 U/L (ref 40–150)
BUN: 17 mg/dL (ref 7–26)
CHLORIDE: 103 mmol/L (ref 98–109)
CO2: 25 mmol/L (ref 22–29)
Calcium: 9.2 mg/dL (ref 8.4–10.4)
Creatinine: 0.63 mg/dL (ref 0.60–1.10)
GFR, Est AFR Am: >60 mL/min (ref >60)
GFR, Estimated: >60 mL/min (ref >60)
Glucose, Bld: 135 mg/dL (ref 70–140)
POTASSIUM: 3 mmol/L — AB (ref 3.5–5.1)
SODIUM: 136 mmol/L (ref 136–145)
Total Bilirubin: 0.3 mg/dL (ref 0.2–1.2)
Total Protein: 7.1 g/dL (ref 6.4–8.3)

## 2018-01-10 LAB — CBC WITH DIFFERENTIAL (CANCER CENTER ONLY)
Basophils Absolute: 0 10*3/uL (ref 0.0–0.1)
Basophils Relative: 1 %
EOS PCT: 0 %
Eosinophils Absolute: 0 10*3/uL (ref 0.0–0.5)
HEMATOCRIT: 33.9 % — AB (ref 34.8–46.6)
Hemoglobin: 11.5 g/dL — ABNORMAL LOW (ref 11.6–15.9)
LYMPHS ABS: 0.2 10*3/uL — AB (ref 0.9–3.3)
LYMPHS PCT: 12 %
MCH: 33.9 pg (ref 25.1–34.0)
MCHC: 33.9 g/dL (ref 31.5–36.0)
MCV: 100 fL (ref 79.5–101.0)
Monocytes Absolute: 0.3 10*3/uL (ref 0.1–0.9)
Monocytes Relative: 14 %
NEUTROS ABS: 1.4 10*3/uL — AB (ref 1.5–6.5)
Neutrophils Relative %: 73 %
PLATELETS: 176 10*3/uL (ref 145–400)
RBC: 3.39 MIL/uL — ABNORMAL LOW (ref 3.70–5.45)
RDW: 15.7 % — AB (ref 11.2–14.5)
WBC Count: 1.9 10*3/uL — ABNORMAL LOW (ref 3.9–10.3)

## 2018-01-10 MED ORDER — POTASSIUM CHLORIDE CRYS ER 20 MEQ PO TBCR: 20.0000 meq | EXTENDED_RELEASE_TABLET | Freq: Every day | ORAL | 0 refills | Status: DC

## 2018-01-10 MED ORDER — HEPARIN SOD (PORK) LOCK FLUSH 100 UNIT/ML IV SOLN
500.0000 [IU] | Freq: Once | INTRAVENOUS | Status: AC | PRN
  Administered 2018-01-10: 500 [IU]
  Filled 2018-01-10: qty 5

## 2018-01-10 MED ORDER — SODIUM CHLORIDE 0.9% FLUSH
10.0000 mL | INTRAVENOUS | Status: DC | PRN
  Administered 2018-01-10: 10 mL
  Filled 2018-01-10: qty 10

## 2018-01-10 NOTE — Progress Notes (Signed)
Temazepam refilled.

## 2018-01-10 NOTE — Telephone Encounter (Signed)
Received critical lab value. Potassium 3.0. Spoke with Mikey Bussing NP and received verbal order for K-Dur, 20 Meq, 1 tab po daily for 7 days. Escript sent to [facility] on [address]. Left message for patient to return call. Need to educate on potassium rich foods. Has lab appointment next week for recheck.

## 2018-01-18 ENCOUNTER — Inpatient Hospital Stay: Payer: 59

## 2018-01-18 DIAGNOSIS — Z95828 Presence of other vascular implants and grafts: Secondary | ICD-10-CM

## 2018-01-18 DIAGNOSIS — C3490 Malignant neoplasm of unspecified part of unspecified bronchus or lung: Secondary | ICD-10-CM

## 2018-01-18 DIAGNOSIS — Z5111 Encounter for antineoplastic chemotherapy: Secondary | ICD-10-CM | POA: Diagnosis not present

## 2018-01-18 LAB — CBC WITH DIFFERENTIAL (CANCER CENTER ONLY)
Basophils Absolute: 0 10*3/uL (ref 0.0–0.1)
Basophils Relative: 0 %
Eosinophils Absolute: 0 10*3/uL (ref 0.0–0.5)
Eosinophils Relative: 0 %
HEMATOCRIT: 33.1 % — AB (ref 34.8–46.6)
HEMOGLOBIN: 11.3 g/dL — AB (ref 11.6–15.9)
LYMPHS PCT: 6 %
Lymphs Abs: 0.2 10*3/uL — ABNORMAL LOW (ref 0.9–3.3)
MCH: 35.1 pg — ABNORMAL HIGH (ref 25.1–34.0)
MCHC: 34.1 g/dL (ref 31.5–36.0)
MCV: 102.8 fL — AB (ref 79.5–101.0)
MONO ABS: 0.5 10*3/uL (ref 0.1–0.9)
Monocytes Relative: 15 %
NEUTROS ABS: 2.5 10*3/uL (ref 1.5–6.5)
NEUTROS PCT: 79 %
Platelet Count: 124 10*3/uL — ABNORMAL LOW (ref 145–400)
RBC: 3.22 MIL/uL — ABNORMAL LOW (ref 3.70–5.45)
RDW: 17.7 % — AB (ref 11.2–14.5)
WBC Count: 3.1 10*3/uL — ABNORMAL LOW (ref 3.9–10.3)

## 2018-01-18 LAB — CMP (CANCER CENTER ONLY)
ALT: 33 U/L (ref 0–55)
ANION GAP: 9 (ref 3–11)
AST: 34 U/L (ref 5–34)
Albumin: 3.5 g/dL (ref 3.5–5.0)
Alkaline Phosphatase: 107 U/L (ref 40–150)
BILIRUBIN TOTAL: 0.2 mg/dL (ref 0.2–1.2)
BUN: 11 mg/dL (ref 7–26)
CO2: 22 mmol/L (ref 22–29)
Calcium: 9 mg/dL (ref 8.4–10.4)
Chloride: 105 mmol/L (ref 98–109)
Creatinine: 0.61 mg/dL (ref 0.60–1.10)
GFR, Estimated: >60 mL/min (ref >60)
GLUCOSE: 99 mg/dL (ref 70–140)
POTASSIUM: 3.8 mmol/L (ref 3.5–5.1)
SODIUM: 136 mmol/L (ref 136–145)
TOTAL PROTEIN: 6.9 g/dL (ref 6.4–8.3)

## 2018-01-18 MED ORDER — SODIUM CHLORIDE 0.9% FLUSH
10.0000 mL | INTRAVENOUS | Status: DC | PRN
  Administered 2018-01-18: 10 mL
  Filled 2018-01-18: qty 10

## 2018-01-18 MED ORDER — HEPARIN SOD (PORK) LOCK FLUSH 100 UNIT/ML IV SOLN
500.0000 [IU] | Freq: Once | INTRAVENOUS | Status: AC | PRN
  Administered 2018-01-18: 500 [IU]
  Filled 2018-01-18: qty 5

## 2018-01-19 ENCOUNTER — Telehealth: Payer: Self-pay | Admitting: Medical Oncology

## 2018-01-19 NOTE — Telephone Encounter (Signed)
Lab results faxed to [facility] rheumatology.

## 2018-01-20 ENCOUNTER — Ambulatory Visit (HOSPITAL_COMMUNITY)
Admission: RE | Admit: 2018-01-20 | Discharge: 2018-01-20 | Disposition: A | Discharge status: Home / Self Care | Payer: 59 | Source: Ambulatory Visit | Attending: Internal Medicine | Admitting: Internal Medicine

## 2018-01-20 DIAGNOSIS — K769 Liver disease, unspecified: Secondary | ICD-10-CM | POA: Insufficient documentation

## 2018-01-20 DIAGNOSIS — C3490 Malignant neoplasm of unspecified part of unspecified bronchus or lung: Secondary | ICD-10-CM | POA: Diagnosis present

## 2018-01-20 DIAGNOSIS — J439 Emphysema, unspecified: Secondary | ICD-10-CM | POA: Diagnosis not present

## 2018-01-20 DIAGNOSIS — R590 Localized enlarged lymph nodes: Secondary | ICD-10-CM | POA: Insufficient documentation

## 2018-01-20 MED ORDER — HEPARIN SOD (PORK) LOCK FLUSH 100 UNIT/ML IV SOLN
500.0000 [IU] | Freq: Once | INTRAVENOUS | Status: AC
  Administered 2018-01-20: 500 [IU] via INTRAVENOUS

## 2018-01-20 MED ORDER — IOPAMIDOL (ISOVUE-300) INJECTION 61%
100.0000 mL | Freq: Once | INTRAVENOUS | Status: AC | PRN
  Administered 2018-01-20: 100 mL via INTRAVENOUS

## 2018-01-20 MED ORDER — IOPAMIDOL (ISOVUE-300) INJECTION 61%
INTRAVENOUS | Status: AC
  Filled 2018-01-20: qty 100

## 2018-01-20 MED ORDER — HEPARIN SOD (PORK) LOCK FLUSH 100 UNIT/ML IV SOLN
INTRAVENOUS | Status: AC
  Administered 2018-01-20: 500 [IU] via INTRAVENOUS
  Filled 2018-01-20: qty 5

## 2018-01-21 ENCOUNTER — Telehealth: Payer: Self-pay | Admitting: *Deleted

## 2018-01-21 NOTE — Telephone Encounter (Signed)
Pt husband called requesting scan results. Discussed with spouse MD out of office and does not usually go over results on the phone if he were in the office. Confirmed pt's appt for Monday 10. No further concerns.

## 2018-01-24 ENCOUNTER — Other Ambulatory Visit: Payer: Self-pay | Admitting: Internal Medicine

## 2018-01-24 ENCOUNTER — Inpatient Hospital Stay: Payer: 59 | Attending: Oncology

## 2018-01-24 ENCOUNTER — Telehealth: Payer: Self-pay | Admitting: Oncology

## 2018-01-24 ENCOUNTER — Encounter: Payer: Self-pay | Admitting: Internal Medicine

## 2018-01-24 ENCOUNTER — Inpatient Hospital Stay: Payer: 59

## 2018-01-24 ENCOUNTER — Inpatient Hospital Stay (HOSPITAL_BASED_OUTPATIENT_CLINIC_OR_DEPARTMENT_OTHER): Payer: 59 | Admitting: Internal Medicine

## 2018-01-24 ENCOUNTER — Inpatient Hospital Stay: Payer: 59 | Admitting: Nutrition

## 2018-01-24 VITALS — BP 108/82 | HR 94 | Temp 98.4°F | Resp 18 | Ht 62.0 in | Wt 111.7 lb

## 2018-01-24 DIAGNOSIS — C342 Malignant neoplasm of middle lobe, bronchus or lung: Secondary | ICD-10-CM | POA: Diagnosis not present

## 2018-01-24 DIAGNOSIS — R531 Weakness: Secondary | ICD-10-CM

## 2018-01-24 DIAGNOSIS — Z923 Personal history of irradiation: Secondary | ICD-10-CM

## 2018-01-24 DIAGNOSIS — M329 Systemic lupus erythematosus, unspecified: Secondary | ICD-10-CM | POA: Diagnosis not present

## 2018-01-24 DIAGNOSIS — R630 Anorexia: Secondary | ICD-10-CM | POA: Diagnosis not present

## 2018-01-24 DIAGNOSIS — Z5111 Encounter for antineoplastic chemotherapy: Secondary | ICD-10-CM | POA: Insufficient documentation

## 2018-01-24 DIAGNOSIS — C7951 Secondary malignant neoplasm of bone: Secondary | ICD-10-CM

## 2018-01-24 DIAGNOSIS — C7970 Secondary malignant neoplasm of unspecified adrenal gland: Secondary | ICD-10-CM | POA: Insufficient documentation

## 2018-01-24 DIAGNOSIS — Z79899 Other long term (current) drug therapy: Secondary | ICD-10-CM | POA: Insufficient documentation

## 2018-01-24 DIAGNOSIS — R51 Headache: Secondary | ICD-10-CM | POA: Diagnosis not present

## 2018-01-24 DIAGNOSIS — R110 Nausea: Secondary | ICD-10-CM | POA: Insufficient documentation

## 2018-01-24 DIAGNOSIS — C7931 Secondary malignant neoplasm of brain: Secondary | ICD-10-CM | POA: Insufficient documentation

## 2018-01-24 DIAGNOSIS — G4700 Insomnia, unspecified: Secondary | ICD-10-CM

## 2018-01-24 DIAGNOSIS — C3490 Malignant neoplasm of unspecified part of unspecified bronchus or lung: Secondary | ICD-10-CM

## 2018-01-24 DIAGNOSIS — E860 Dehydration: Secondary | ICD-10-CM | POA: Insufficient documentation

## 2018-01-24 DIAGNOSIS — M797 Fibromyalgia: Secondary | ICD-10-CM

## 2018-01-24 DIAGNOSIS — Z95828 Presence of other vascular implants and grafts: Secondary | ICD-10-CM

## 2018-01-24 LAB — CBC WITH DIFFERENTIAL (CANCER CENTER ONLY)
BASOS PCT: 0 %
Basophils Absolute: 0 10*3/uL (ref 0.0–0.1)
Eosinophils Absolute: 0 10*3/uL (ref 0.0–0.5)
Eosinophils Relative: 1 %
HCT: 35.7 % (ref 34.8–46.6)
Hemoglobin: 11.9 g/dL (ref 11.6–15.9)
LYMPHS PCT: 16 %
Lymphs Abs: 0.7 10*3/uL — ABNORMAL LOW (ref 0.9–3.3)
MCH: 34.7 pg — ABNORMAL HIGH (ref 25.1–34.0)
MCHC: 33.3 g/dL (ref 31.5–36.0)
MCV: 104.1 fL — AB (ref 79.5–101.0)
MONO ABS: 0.4 10*3/uL (ref 0.1–0.9)
MONOS PCT: 9 %
NEUTROS ABS: 3.2 10*3/uL (ref 1.5–6.5)
Neutrophils Relative %: 74 %
Platelet Count: 172 10*3/uL (ref 145–400)
RBC: 3.43 MIL/uL — ABNORMAL LOW (ref 3.70–5.45)
RDW: 17.5 % — AB (ref 11.2–14.5)
WBC Count: 4.3 10*3/uL (ref 3.9–10.3)

## 2018-01-24 LAB — CMP (CANCER CENTER ONLY)
ALT: 30 U/L (ref 0–55)
ANION GAP: 7 (ref 3–11)
AST: 31 U/L (ref 5–34)
Albumin: 3.5 g/dL (ref 3.5–5.0)
Alkaline Phosphatase: 122 U/L (ref 40–150)
BILIRUBIN TOTAL: 0.3 mg/dL (ref 0.2–1.2)
BUN: 7 mg/dL (ref 7–26)
CO2: 25 mmol/L (ref 22–29)
Calcium: 9 mg/dL (ref 8.4–10.4)
Chloride: 104 mmol/L (ref 98–109)
Creatinine: 0.61 mg/dL (ref 0.60–1.10)
Glucose, Bld: 96 mg/dL (ref 70–140)
POTASSIUM: 3.7 mmol/L (ref 3.5–5.1)
Sodium: 136 mmol/L (ref 136–145)
TOTAL PROTEIN: 7 g/dL (ref 6.4–8.3)

## 2018-01-24 MED ORDER — PEMETREXED DISODIUM CHEMO INJECTION 500 MG
510.0000 mg/m2 | Freq: Once | INTRAVENOUS | Status: AC
  Administered 2018-01-24: 800 mg via INTRAVENOUS
  Filled 2018-01-24: qty 20

## 2018-01-24 MED ORDER — ONDANSETRON 4 MG PO TBDP: 4.0000 mg | ORAL_TABLET | Freq: Three times a day (TID) | ORAL | 0 refills | Status: DC | PRN

## 2018-01-24 MED ORDER — PALONOSETRON HCL INJECTION 0.25 MG/5ML
0.2500 mg | Freq: Once | INTRAVENOUS | Status: AC
  Administered 2018-01-24: 0.25 mg via INTRAVENOUS

## 2018-01-24 MED ORDER — BEVACIZUMAB CHEMO INJECTION 400 MG/16ML
800.0000 mg | Freq: Once | INTRAVENOUS | Status: AC
  Administered 2018-01-24: 800 mg via INTRAVENOUS
  Filled 2018-01-24: qty 32

## 2018-01-24 MED ORDER — SODIUM CHLORIDE 0.9% FLUSH
10.0000 mL | INTRAVENOUS | Status: DC | PRN
  Administered 2018-01-24: 10 mL
  Filled 2018-01-24: qty 10

## 2018-01-24 MED ORDER — HEPARIN SOD (PORK) LOCK FLUSH 100 UNIT/ML IV SOLN
500.0000 [IU] | Freq: Once | INTRAVENOUS | Status: AC | PRN
  Administered 2018-01-24: 500 [IU]
  Filled 2018-01-24: qty 5

## 2018-01-24 MED ORDER — FOSAPREPITANT DIMEGLUMINE INJECTION 150 MG
Freq: Once | INTRAVENOUS | Status: AC
  Administered 2018-01-24: 12:00:00 via INTRAVENOUS
  Filled 2018-01-24: qty 5

## 2018-01-24 MED ORDER — PALONOSETRON HCL INJECTION 0.25 MG/5ML
INTRAVENOUS | Status: AC
  Filled 2018-01-24: qty 5

## 2018-01-24 MED ORDER — CARBOPLATIN CHEMO INJECTION 600 MG/60ML
484.5000 mg | Freq: Once | INTRAVENOUS | Status: AC
  Administered 2018-01-24: 480 mg via INTRAVENOUS
  Filled 2018-01-24: qty 48

## 2018-01-24 MED ORDER — SODIUM CHLORIDE 0.9 % IV SOLN
Freq: Once | INTRAVENOUS | Status: AC
  Administered 2018-01-24: 12:00:00 via INTRAVENOUS

## 2018-01-24 NOTE — Progress Notes (Signed)
[place] Telephone:([phone]) [phone]   [phone]) [phone]  OFFICE PROGRESS NOTE  [name], MD [place] [address]  DIAGNOSIS: Stage IV non-small cell lung cancer, adenocarcinoma presenting with a left middle lobe mass that abuts the mediastinal border/pericardium and the pleural surface. The patient was found to have adrenal and bone metastasis andover 20 brain metastases.  PD-L1 50%  PRIOR THERAPY: Whole brain radiation and palliative radiation to the left lung on 11/10/2017.  CURRENT THERAPY: Systemic chemotherapy with carboplatin for an AUC of 5, Alimta 500 mg/m2, and Avastin 15 mg/kg every 3 weeks. First dose expected on 11/22/2017.  Status post 3 cycles.  INTERVAL HISTORY: Diana Schwartz 52 y.o. female returns to the clinic today for follow-up visit accompanied by her husband and daughter.  The patient is feeling fine today with no specific complaints except for fatigue and intermittent nausea.  She took Compazine with no improvement of her nausea.  She denied having any chest pain, shortness breath, cough or hemoptysis.  She denied having any fever or chills.  She has no diarrhea or constipation.  She denied having any recent weight loss or night sweats.  She has been tolerating her systemic chemotherapy fairly well except for the fatigue and nausea.  She continues to have intermittent headache.  She had repeat CT scan of the chest, abdomen and pelvis performed recently and she is here for evaluation and discussion of her risk her results.  MEDICAL HISTORY: Past Medical History:  Diagnosis Date  . Anxiety   . Autoimmune hepatitis ([place])    no current med.  . Breast cancer [facility]) 2013   Left breast cancer. Treated at [facility]. Per notes, nvasive lobular carcinoma. S/P bilateral mastectomy with recontruction and Tamoxifen X5 years.   . Carpal tunnel syndrome of left wrist   . Degenerative disc disease, lumbar   . Depression   .  Fibromyalgia   . Lupus (systemic lupus erythematosus) ([place])   . Osteoarthritis   . Rash    arms, chest - due to lupus  . Raynaud disease   . Rheumatoid arthritis ([place])   . Sjoegren syndrome     ALLERGIES:  is allergic to oxycodone; doxycycline; and prednisone.  MEDICATIONS:  Current Outpatient Medications  Medication Sig Dispense Refill  . acetaminophen (TYLENOL) 500 MG tablet Take 1,000 mg by mouth every 6 (six) hours as needed.    Marland Kitchen azaTHIOprine (IMURAN) 50 MG tablet Take 50 mg by mouth 2 (two) times daily.    . Cholecalciferol (HM VITAMIN D3) 4000 units CAPS Take by mouth.    . dexamethasone (DECADRON) 2 MG tablet Take 1 tablet (2 mg total) by mouth 2 (two) times daily. 60 tablet 0  . dexamethasone (DECADRON) 4 MG tablet Take 1 tablet twice a day the day before, day of, and day after each cycle of chemotherapy. (Patient not taking: Reported on 12/13/2017) 30 tablet 1  . folic acid (FOLVITE) 1 MG tablet Take 1 tablet (1 mg total) by mouth daily. 30 tablet 2  . HYDROcodone-acetaminophen (NORCO/VICODIN) 5-325 MG tablet     . hydroxychloroquine (PLAQUENIL) 200 MG tablet Take 200 mg by mouth 2 (two) times daily.    Marland Kitchen levETIRAcetam (KEPPRA) 1000 MG tablet Take 1 tablet (1,000 mg total) by mouth 2 (two) times daily. 60 tablet 5  . lidocaine-prilocaine (EMLA) cream Apply 1 application topically as needed. 30 g 0  . omeprazole (PRILOSEC) 40 MG capsule Take by mouth  daily.  3  . potassium chloride SA (K-DUR,KLOR-CON) 20 MEQ tablet Take 1 tablet (20 mEq total) by mouth daily for 7 days. 7 tablet 0  . prochlorperazine (COMPAZINE) 10 MG tablet Take 1 tablet (10 mg total) by mouth every 6 (six) hours as needed for nausea or vomiting. 30 tablet 1  . senna-docusate (SENNA S) 8.6-50 MG tablet Take 1 tablet by mouth daily.    . temazepam (RESTORIL) 30 MG capsule TAKE 1 CAPSULE BY MOUTH AT BEDTIME AS NEEDED FOR SLEEP. DO NOT TAKE WITH XANAX (ALPRAZOLAM) 30 capsule 0  . venlafaxine XR (EFFEXOR-XR) 150 MG  24 hr capsule Take 2 capsules by mouth daily with breakfast.      No current facility-administered medications for this visit.     SURGICAL HISTORY:  Past Surgical History:  Procedure Laterality Date  . ANTERIOR CERVICAL DECOMP/DISCECTOMY FUSION  02/06/2004   C5-6, C6-7  . AXILLARY NODE DISSECTION Left 08/2012  . BREAST RECONSTRUCTION  06/2013  . CARPAL TUNNEL RELEASE Right 1989  . CARPAL TUNNEL RELEASE Left 10/02/2013   Procedure: LEFT CARPAL TUNNEL RELEASE;  Surgeon: Linna Hoff, MD;  Location: [facility];  Service: Orthopedics;  Laterality: Left;  Local Anesthesia with IV Sedation  . ENDOBRONCHIAL ULTRASOUND Bilateral 10/27/2017   Procedure: ENDOBRONCHIAL ULTRASOUND;  Surgeon: Marshell Garfinkel, MD;  Location: [facility];  Service: Cardiopulmonary;  Laterality: Bilateral;  . FOOT SURGERY Right    bunion and broke toe and then realign toe  . IR FLUORO GUIDE PORT INSERTION RIGHT  10/25/2017  . IR US GUIDE VASC ACCESS RIGHT  10/25/2017  . MASTECTOMY Bilateral 08/12/2012   LEFT BREAST CANCER  . thoracic back surgery  05/2015  . VAGINAL HYSTERECTOMY  1999    REVIEW OF SYSTEMS:  Constitutional: positive for fatigue Eyes: negative Ears, nose, mouth, throat, and face: negative Respiratory: negative Cardiovascular: negative Gastrointestinal: positive for nausea Genitourinary:negative Integument/breast: negative Hematologic/lymphatic: negative Musculoskeletal:negative Neurological: negative Behavioral/Psych: negative Endocrine: negative Allergic/Immunologic: negative   PHYSICAL EXAMINATION: General appearance: alert, cooperative, fatigued and no distress Head: Normocephalic, without obvious abnormality, atraumatic Neck: no adenopathy, no JVD, supple, symmetrical, trachea midline and thyroid not enlarged, symmetric, no tenderness/mass/nodules Lymph nodes: Cervical, supraclavicular, and axillary nodes normal. Resp: clear to auscultation bilaterally Back: symmetric, no  curvature. ROM normal. No CVA tenderness. Cardio: regular rate and rhythm, S1, S2 normal, no murmur, click, rub or gallop GI: soft, non-tender; bowel sounds normal; no masses,  no organomegaly Extremities: extremities normal, atraumatic, no cyanosis or edema Neurologic: Alert and oriented X 3, normal strength and tone. Normal symmetric reflexes. Normal coordination and gait  ECOG PERFORMANCE STATUS: 1 - Symptomatic but completely ambulatory  Blood pressure 108/82, pulse 94, temperature 98.4 F (36.9 C), temperature source Oral, resp. rate 18, height 5' 2"  (1.575 m), weight 111 lb 11.2 oz (50.7 kg), SpO2 100 %.  LABORATORY DATA: Lab Results  Component Value Date   WBC 4.3 01/24/2018   HGB 11.9 01/24/2018   HCT 35.7 01/24/2018   MCV 104.1 (H) 01/24/2018   PLT 172 01/24/2018      Chemistry      Component Value Date/Time   NA 136 01/18/2018 1135   K 3.8 01/18/2018 1135   CL 105 01/18/2018 1135   CO2 22 01/18/2018 1135   BUN 11 01/18/2018 1135   CREATININE 0.61 01/18/2018 1135      Component Value Date/Time   CALCIUM 9.0 01/18/2018 1135   ALKPHOS 107 01/18/2018 1135   AST 34 01/18/2018 1135  ALT 33 01/18/2018 1135   BILITOT 0.2 01/18/2018 1135       RADIOGRAPHIC STUDIES: Ct Chest W Contrast  Result Date: 01/20/2018 CLINICAL DATA:  History of metastatic lung cancer. History of breast cancer. Patient status post chemotherapy and radiation. EXAM: CT CHEST, ABDOMEN, AND PELVIS WITH CONTRAST TECHNIQUE: Multidetector CT imaging of the chest, abdomen and pelvis was performed following the standard protocol during bolus administration of intravenous contrast. CONTRAST:  130m ISOVUE-300 IOPAMIDOL (ISOVUE-300) INJECTION 61% COMPARISON:  PET-CT 10/19/2017 FINDINGS: CT CHEST FINDINGS Cardiovascular: Right anterior chest wall Port-A-Cath is present with tip terminating in the superior vena cava. Trace fluid superior pericardial recess. Mediastinum/Nodes: Postsurgical changes left axilla.  No right axillary adenopathy. Interval decrease in size of right paratracheal lymph node measuring 1.1 cm (image 25; series 2), previously 1.9 cm. Interval decrease in size of subcarinal lymph node measuring 0.9 cm (image 33; series 2), previously 2.3 cm. Interval decrease in size of left axillary lymph node measuring 0.6 cm (image 15; series 2), previously 1.1 cm. Esophagus is normal in appearance. Lungs/Pleura: Central airways are patent. Centrilobular and paraseptal emphysematous change. Interval decrease in size of left upper lobe mass measuring 5.5 x 3.1 cm (image 79; series 4), previously 5.9 x 8.4 cm. Interval decrease in previously described surrounding ground-glass opacities with interlobular septal thickening. Re demonstrated small nodules within the medial left upper lobe including a 3 mm subpleural left upper lobe nodule (image 36; series 4). Unchanged 3 mm right upper lobe nodule (image 20; series 4). Unchanged adjacent scattered small nodularity within the right upper lobe. Stable 3 mm right lower lobe nodule (image 129; series 4). No pleural effusion or pneumothorax. Musculoskeletal: Interval development of a 1.7 cm sclerotic lesion within the left humeral head (image 10; series 4). Thoracic spine degenerative changes. CT ABDOMEN PELVIS FINDINGS Hepatobiliary: There is a 0.7 cm low-attenuation lesion within the hepatic dome (image 56; series 2). Gallbladder is unremarkable. No intrahepatic or extrahepatic biliary ductal dilatation. Pancreas: Unremarkable Spleen: Unremarkable Adrenals/Urinary Tract: Interval decrease in size of left adrenal nodule measuring 1.5 cm (image 62; series 2), previously 2.4 cm. Right adrenal gland is unremarkable. Kidneys enhance symmetrically with contrast. Subcentimeter low-attenuation lesion too small to characterize inferior pole right kidney. Urinary bladder is decompressed. Stomach/Bowel: No abnormal bowel wall thickening or evidence for bowel obstruction. Normal  morphology of the stomach. No free fluid or free intraperitoneal air. Vascular/Lymphatic: Normal caliber abdominal aorta. Peripheral calcified atherosclerotic plaque. No retroperitoneal lymphadenopathy. Reproductive: Status post hysterectomy. Adnexal structures unremarkable. Other: None. Musculoskeletal: Lumbar spine degenerative changes. There is a 2.1 cm sclerotic lesion within the left acetabulum (image 106; series 2). IMPRESSION: 1. Interval decrease in size of left upper lobe mass with improving surrounding ground-glass and consolidative opacities. 2. Interval decrease in size of mediastinal and left axillary adenopathy. 3. Subcentimeter low-attenuation lesion within the liver, indeterminate. Hepatic metastatic disease not excluded. 4.  Emphysema (ICD10-J43.9). Electronically Signed   By: DLovey NewcomerM.D.   On: 01/20/2018 13:00   Ct Abdomen Pelvis W Contrast  Result Date: 01/20/2018 CLINICAL DATA:  History of metastatic lung cancer. History of breast cancer. Patient status post chemotherapy and radiation. EXAM: CT CHEST, ABDOMEN, AND PELVIS WITH CONTRAST TECHNIQUE: Multidetector CT imaging of the chest, abdomen and pelvis was performed following the standard protocol during bolus administration of intravenous contrast. CONTRAST:  1053mISOVUE-300 IOPAMIDOL (ISOVUE-300) INJECTION 61% COMPARISON:  PET-CT 10/19/2017 FINDINGS: CT CHEST FINDINGS Cardiovascular: Right anterior chest wall Port-A-Cath is present with tip terminating in  the superior vena cava. Trace fluid superior pericardial recess. Mediastinum/Nodes: Postsurgical changes left axilla. No right axillary adenopathy. Interval decrease in size of right paratracheal lymph node measuring 1.1 cm (image 25; series 2), previously 1.9 cm. Interval decrease in size of subcarinal lymph node measuring 0.9 cm (image 33; series 2), previously 2.3 cm. Interval decrease in size of left axillary lymph node measuring 0.6 cm (image 15; series 2), previously 1.1 cm.  Esophagus is normal in appearance. Lungs/Pleura: Central airways are patent. Centrilobular and paraseptal emphysematous change. Interval decrease in size of left upper lobe mass measuring 5.5 x 3.1 cm (image 79; series 4), previously 5.9 x 8.4 cm. Interval decrease in previously described surrounding ground-glass opacities with interlobular septal thickening. Re demonstrated small nodules within the medial left upper lobe including a 3 mm subpleural left upper lobe nodule (image 36; series 4). Unchanged 3 mm right upper lobe nodule (image 20; series 4). Unchanged adjacent scattered small nodularity within the right upper lobe. Stable 3 mm right lower lobe nodule (image 129; series 4). No pleural effusion or pneumothorax. Musculoskeletal: Interval development of a 1.7 cm sclerotic lesion within the left humeral head (image 10; series 4). Thoracic spine degenerative changes. CT ABDOMEN PELVIS FINDINGS Hepatobiliary: There is a 0.7 cm low-attenuation lesion within the hepatic dome (image 56; series 2). Gallbladder is unremarkable. No intrahepatic or extrahepatic biliary ductal dilatation. Pancreas: Unremarkable Spleen: Unremarkable Adrenals/Urinary Tract: Interval decrease in size of left adrenal nodule measuring 1.5 cm (image 62; series 2), previously 2.4 cm. Right adrenal gland is unremarkable. Kidneys enhance symmetrically with contrast. Subcentimeter low-attenuation lesion too small to characterize inferior pole right kidney. Urinary bladder is decompressed. Stomach/Bowel: No abnormal bowel wall thickening or evidence for bowel obstruction. Normal morphology of the stomach. No free fluid or free intraperitoneal air. Vascular/Lymphatic: Normal caliber abdominal aorta. Peripheral calcified atherosclerotic plaque. No retroperitoneal lymphadenopathy. Reproductive: Status post hysterectomy. Adnexal structures unremarkable. Other: None. Musculoskeletal: Lumbar spine degenerative changes. There is a 2.1 cm sclerotic  lesion within the left acetabulum (image 106; series 2). IMPRESSION: 1. Interval decrease in size of left upper lobe mass with improving surrounding ground-glass and consolidative opacities. 2. Interval decrease in size of mediastinal and left axillary adenopathy. 3. Subcentimeter low-attenuation lesion within the liver, indeterminate. Hepatic metastatic disease not excluded. 4.  Emphysema (ICD10-J43.9). Electronically Signed   By: Lovey Newcomer M.D.   On: 01/20/2018 13:00    ASSESSMENT AND PLAN: This is a very pleasant 52 years old white female with metastatic non-small cell lung cancer, adenocarcinoma with no actionable mutations.  PDL 1 expression is 50% but the patient has significant lupus.  She is status post whole brain irradiation.  She is currently on treatment with systemic chemotherapy with carboplatin, Alimta and Avastin status post 3 cycles. The patient has been tolerating this treatment well except for fatigue and nausea. She had a repeat CT scan of the chest, abdomen and pelvis performed recently. I personally and independently reviewed the scan images and discussed the result and showed the images to the patient and her family.  Her scan showed significant improvement of her disease. I recommended for the patient to continue her current treatment with carboplatin, Alimta and Avastin and she will proceed with cycle #4 today. For nausea I will start the patient on Zofran 4 mg p.o. ODT as needed.\ For the headache, she is scheduled to have repeat MRI of the brain on 01/27/2018. She will come back for follow-up visit in 3 weeks for evaluation before  starting cycle #5. The patient was advised to call immediately if she has any concerning symptoms in the interval. The patient voices understanding of current disease status and treatment options and is in agreement with the current care plan.  All questions were answered. The patient knows to call the clinic with any problems, questions or  concerns. We can certainly see the patient much sooner if necessary.  Disclaimer: This note was dictated with voice recognition software. Similar sounding words can inadvertently be transcribed and may not be corrected upon review.

## 2018-01-24 NOTE — Progress Notes (Signed)
52 year old female diagnosed with non-small cell lung cancer.  She is a patient of Dr. Julien Nordmann.  Past medical history includes lupus, fibromyalgia, depression, breast cancer in 2013, hepatitis, and anxiety.  Medications include vitamin D3, Decadron, Prilosec, Compazine, and Effexor XR.  Labs were reviewed.  Height: 62 inches. Weight: 111.7 pounds on June 3. Usual body weight: 119 pounds March 2019. BMI: 20.43.  Patient reports her appetite is poor.   She continues to have nausea and vomiting on a regular basis and reports Compazine is not effective. MD has prescribed Zofran. She reports taste alterations and describes that food does not "taste right". States alternating diarrhea and constipation have improved. Reports she has never been a big eater.  She loves healthy foods like raw vegetables and salads. Patient has lost 6% body weight over 3 months. Nutrition focused physical exam deferred. Patient is shivering and RN trying to get chemotherapy started.  Nutrition diagnosis: Unintended weight loss related to non-small cell lung cancer and associated treatments as evidenced by 7 pound weight loss over 3 months.  Intervention: I educated patient to try smaller amounts of food more often. Encourage patient to add healthy fats to her her meals and snacks. Reviewed strategies for adding extra calories to foods. Educated patient on taste alterations and provided fact sheet. Questions were answered.  Teach back method used.  Contact information was given.  Monitoring, evaluation, goals: Patient will tolerate increased calories and protein to minimize further weight loss.  Next visit: Monday, June 24 during infusion.  **Disclaimer: This note was dictated with voice recognition software. Similar sounding words can inadvertently be transcribed and this note may contain transcription errors which may not have been corrected upon publication of note.**

## 2018-01-24 NOTE — Patient Instructions (Signed)
[facility] Discharge Instructions for Patients Receiving Chemotherapy  Today you received the following chemotherapy agents Avastin, Alimta, Carboplatin  To help prevent nausea and vomiting after your treatment, we encourage you to take your nausea medication as directed   If you develop nausea and vomiting that is not controlled by your nausea medication, call the clinic.   BELOW ARE SYMPTOMS THAT SHOULD BE REPORTED IMMEDIATELY:  *FEVER GREATER THAN 100.5 F  *CHILLS WITH OR WITHOUT FEVER  NAUSEA AND VOMITING THAT IS NOT CONTROLLED WITH YOUR NAUSEA MEDICATION  *UNUSUAL SHORTNESS OF BREATH  *UNUSUAL BRUISING OR BLEEDING  TENDERNESS IN MOUTH AND THROAT WITH OR WITHOUT PRESENCE OF ULCERS  *URINARY PROBLEMS  *BOWEL PROBLEMS  UNUSUAL RASH Items with * indicate a potential emergency and should be followed up as soon as possible.  Feel free to call the clinic should you have any questions or concerns. The clinic phone number is [phone].  Please show the [place] at check-in to the Emergency Department and triage nurse.

## 2018-01-24 NOTE — Telephone Encounter (Signed)
appts already scheduled per 6/3 los.

## 2018-01-27 ENCOUNTER — Ambulatory Visit (HOSPITAL_COMMUNITY)
Admission: RE | Admit: 2018-01-27 | Discharge: 2018-01-27 | Disposition: A | Discharge status: Home / Self Care | Payer: 59 | Source: Ambulatory Visit | Attending: Radiation Oncology | Admitting: Radiation Oncology

## 2018-01-27 DIAGNOSIS — C7931 Secondary malignant neoplasm of brain: Secondary | ICD-10-CM | POA: Insufficient documentation

## 2018-01-27 DIAGNOSIS — C801 Malignant (primary) neoplasm, unspecified: Secondary | ICD-10-CM | POA: Diagnosis not present

## 2018-01-27 DIAGNOSIS — C7949 Secondary malignant neoplasm of other parts of nervous system: Secondary | ICD-10-CM | POA: Diagnosis not present

## 2018-01-27 DIAGNOSIS — R601 Generalized edema: Secondary | ICD-10-CM | POA: Diagnosis not present

## 2018-01-27 MED ORDER — GADOBENATE DIMEGLUMINE 529 MG/ML IV SOLN
10.0000 mL | Freq: Once | INTRAVENOUS | Status: AC
  Administered 2018-01-27: 10 mL via INTRAVENOUS

## 2018-01-27 MED ORDER — HEPARIN SOD (PORK) LOCK FLUSH 100 UNIT/ML IV SOLN
500.0000 [IU] | INTRAVENOUS | Status: AC | PRN
  Administered 2018-01-27: 500 [IU]

## 2018-01-31 ENCOUNTER — Inpatient Hospital Stay: Payer: 59

## 2018-01-31 ENCOUNTER — Encounter: Payer: Self-pay | Admitting: Internal Medicine

## 2018-01-31 ENCOUNTER — Other Ambulatory Visit: Payer: Self-pay

## 2018-01-31 ENCOUNTER — Telehealth: Payer: Self-pay | Admitting: Internal Medicine

## 2018-01-31 ENCOUNTER — Inpatient Hospital Stay (HOSPITAL_BASED_OUTPATIENT_CLINIC_OR_DEPARTMENT_OTHER): Payer: 59 | Admitting: Internal Medicine

## 2018-01-31 VITALS — BP 101/78 | HR 98 | Temp 97.5°F | Resp 18 | Ht 62.0 in | Wt 113.0 lb

## 2018-01-31 DIAGNOSIS — C7931 Secondary malignant neoplasm of brain: Secondary | ICD-10-CM

## 2018-01-31 DIAGNOSIS — C7951 Secondary malignant neoplasm of bone: Secondary | ICD-10-CM | POA: Diagnosis not present

## 2018-01-31 DIAGNOSIS — C342 Malignant neoplasm of middle lobe, bronchus or lung: Secondary | ICD-10-CM | POA: Diagnosis not present

## 2018-01-31 DIAGNOSIS — R51 Headache: Secondary | ICD-10-CM | POA: Diagnosis not present

## 2018-01-31 DIAGNOSIS — C7970 Secondary malignant neoplasm of unspecified adrenal gland: Secondary | ICD-10-CM

## 2018-01-31 DIAGNOSIS — Z5111 Encounter for antineoplastic chemotherapy: Secondary | ICD-10-CM | POA: Diagnosis not present

## 2018-01-31 DIAGNOSIS — Z79899 Other long term (current) drug therapy: Secondary | ICD-10-CM | POA: Diagnosis not present

## 2018-01-31 DIAGNOSIS — Z95828 Presence of other vascular implants and grafts: Secondary | ICD-10-CM

## 2018-01-31 DIAGNOSIS — Z923 Personal history of irradiation: Secondary | ICD-10-CM | POA: Diagnosis not present

## 2018-01-31 DIAGNOSIS — C3490 Malignant neoplasm of unspecified part of unspecified bronchus or lung: Secondary | ICD-10-CM

## 2018-01-31 LAB — CBC WITH DIFFERENTIAL (CANCER CENTER ONLY)
BASOS PCT: 1 %
Basophils Absolute: 0 10*3/uL (ref 0.0–0.1)
EOS ABS: 0 10*3/uL (ref 0.0–0.5)
Eosinophils Relative: 1 %
HEMATOCRIT: 33.3 % — AB (ref 34.8–46.6)
HEMOGLOBIN: 11.6 g/dL (ref 11.6–15.9)
Lymphocytes Relative: 17 %
Lymphs Abs: 0.3 10*3/uL — ABNORMAL LOW (ref 0.9–3.3)
MCH: 36 pg — ABNORMAL HIGH (ref 25.1–34.0)
MCHC: 34.7 g/dL (ref 31.5–36.0)
MCV: 103.9 fL — ABNORMAL HIGH (ref 79.5–101.0)
Monocytes Absolute: 0.2 10*3/uL (ref 0.1–0.9)
Monocytes Relative: 15 %
NEUTROS ABS: 1.1 10*3/uL — AB (ref 1.5–6.5)
NEUTROS PCT: 66 %
PLATELETS: 154 10*3/uL (ref 145–400)
RBC: 3.21 MIL/uL — ABNORMAL LOW (ref 3.70–5.45)
RDW: 18 % — AB (ref 11.2–14.5)
WBC Count: 1.7 10*3/uL — ABNORMAL LOW (ref 3.9–10.3)

## 2018-01-31 LAB — CMP (CANCER CENTER ONLY)
ALT: 58 U/L — AB (ref 0–55)
ANION GAP: 10 (ref 3–11)
AST: 63 U/L — ABNORMAL HIGH (ref 5–34)
Albumin: 3.6 g/dL (ref 3.5–5.0)
Alkaline Phosphatase: 127 U/L (ref 40–150)
BUN: 13 mg/dL (ref 7–26)
CHLORIDE: 101 mmol/L (ref 98–109)
CO2: 26 mmol/L (ref 22–29)
Calcium: 9.6 mg/dL (ref 8.4–10.4)
Creatinine: 0.69 mg/dL (ref 0.60–1.10)
Glucose, Bld: 118 mg/dL (ref 70–140)
Potassium: 3.1 mmol/L — ABNORMAL LOW (ref 3.5–5.1)
Sodium: 137 mmol/L (ref 136–145)
Total Bilirubin: <0.2 mg/dL — ABNORMAL LOW (ref 0.2–1.2)
Total Protein: 7.4 g/dL (ref 6.4–8.3)

## 2018-01-31 MED ORDER — SODIUM CHLORIDE 0.9% FLUSH
10.0000 mL | INTRAVENOUS | Status: DC | PRN
  Administered 2018-01-31: 10 mL
  Filled 2018-01-31: qty 10

## 2018-01-31 MED ORDER — FOLIC ACID 1 MG PO TABS: 1.0000 mg | ORAL_TABLET | Freq: Every day | ORAL | 2 refills | Status: AC

## 2018-01-31 MED ORDER — HEPARIN SOD (PORK) LOCK FLUSH 100 UNIT/ML IV SOLN
500.0000 [IU] | Freq: Once | INTRAVENOUS | Status: AC | PRN
  Administered 2018-01-31: 500 [IU]
  Filled 2018-01-31: qty 5

## 2018-01-31 MED ORDER — POTASSIUM CHLORIDE CRYS ER 20 MEQ PO TBCR: 20.0000 meq | EXTENDED_RELEASE_TABLET | Freq: Every day | ORAL | 0 refills | Status: DC

## 2018-01-31 NOTE — Progress Notes (Signed)
[facility] [place], [place] [address] [phone]   Interval Evaluation  Date of Service: 01/31/18 Patient Name: Diana Schwartz Patient MRN: [id number] Patient DOB: 12-16-1965 Provider: Ventura Sellers, MD  Identifying Statement:  Diana Schwartz is a 52 y.o. female with Brain metastases [facility]) [C79.31]   Referring Provider: [name], MD [place],  [id number]  Primary Cancer: [facility], Stage IV  Interval History:  Diana Schwartz presents today for evaluation after recent MRI brain.  She denies new or progressive neurologic deficits, although she is "extremely anxious" about the results of the scan.  She is tolerating chemotherapy (carbo/alimta/avastin) "ok" and has had some issues with nausea.  No recent seizures.  Headaches are almost daily but moderate in severity, and she has avoided analgesia completely.  Sleep is improved with restoril.   Medications: Current Outpatient Medications on File Prior to Visit  Medication Sig Dispense Refill  . azaTHIOprine (IMURAN) 50 MG tablet Take 50 mg by mouth 2 (two) times daily.    . Cholecalciferol (HM VITAMIN D3) 4000 units CAPS Take by mouth.    . dexamethasone (DECADRON) 2 MG tablet Take 1 tablet (2 mg total) by mouth 2 (two) times daily. 60 tablet 0  . hydroxychloroquine (PLAQUENIL) 200 MG tablet Take 200 mg by mouth 2 (two) times daily.    Marland Kitchen levETIRAcetam (KEPPRA) 1000 MG tablet Take 1 tablet (1,000 mg total) by mouth 2 (two) times daily. 60 tablet 5  . lidocaine-prilocaine (EMLA) cream Apply 1 application topically as needed. 30 g 0  . omeprazole (PRILOSEC) 40 MG capsule Take by mouth daily.  3  . ondansetron (ZOFRAN ODT) 4 MG disintegrating tablet Take 1 tablet (4 mg total) by mouth every 8 (eight) hours as needed for nausea or vomiting. 20 tablet 0  . prochlorperazine (COMPAZINE) 10 MG tablet Take 1 tablet (10 mg total) by mouth every 6  (six) hours as needed for nausea or vomiting. 30 tablet 1  . senna-docusate (SENNA S) 8.6-50 MG tablet Take 1 tablet by mouth daily.    . temazepam (RESTORIL) 30 MG capsule TAKE 1 CAPSULE BY MOUTH AT BEDTIME AS NEEDED FOR SLEEP. DO NOT TAKE WITH XANAX (ALPRAZOLAM) 30 capsule 0  . venlafaxine XR (EFFEXOR-XR) 150 MG 24 hr capsule Take 2 capsules by mouth daily with breakfast.     . acetaminophen (TYLENOL) 500 MG tablet Take 1,000 mg by mouth every 6 (six) hours as needed.    Marland Kitchen amoxicillin-clavulanate (AUGMENTIN) 875-125 MG tablet Take 1 tablet by mouth 2 (two) times daily.  0  . benzonatate (TESSALON) 200 MG capsule Take 1 capsule by mouth 3 (three) times daily.  0  . dexamethasone (DECADRON) 4 MG tablet Take 1 tablet twice a day the day before, day of, and day after each cycle of chemotherapy. (Patient not taking: Reported on 01/31/2018) 30 tablet 1  . HYDROcodone-acetaminophen (NORCO/VICODIN) 5-325 MG tablet     . loratadine (CLARITIN) 10 MG tablet Take 10 mg by mouth daily.  0   No current facility-administered medications on file prior to visit.     Allergies:  Allergies  Allergen Reactions  . Oxycodone Anaphylaxis, Hives and Swelling    Whelps and swelling in throat  . Doxycycline Nausea And Vomiting  . Prednisone Nausea And Vomiting and Diarrhea    Can take IV steroids   Past Medical History:  Past Medical History:  Diagnosis Date  .  Anxiety   . Autoimmune hepatitis ([place])    no current med.  . Breast cancer [facility]) 2013   Left breast cancer. Treated at [facility]. Per notes, nvasive lobular carcinoma. S/P bilateral mastectomy with recontruction and Tamoxifen X5 years.   . Carpal tunnel syndrome of left wrist   . Degenerative disc disease, lumbar   . Depression   . Fibromyalgia   . Lupus (systemic lupus erythematosus) ([place])   . Osteoarthritis   . Rash    arms, chest - due to lupus  . Raynaud disease   . Rheumatoid arthritis ([place])   . Sjoegren syndrome    Past Surgical History:    Past Surgical History:  Procedure Laterality Date  . ANTERIOR CERVICAL DECOMP/DISCECTOMY FUSION  02/06/2004   C5-6, C6-7  . AXILLARY NODE DISSECTION Left 08/2012  . BREAST RECONSTRUCTION  06/2013  . CARPAL TUNNEL RELEASE Right 1989  . CARPAL TUNNEL RELEASE Left 10/02/2013   Procedure: LEFT CARPAL TUNNEL RELEASE;  Surgeon: Linna Hoff, MD;  Location: [facility];  Service: Orthopedics;  Laterality: Left;  Local Anesthesia with IV Sedation  . ENDOBRONCHIAL ULTRASOUND Bilateral 10/27/2017   Procedure: ENDOBRONCHIAL ULTRASOUND;  Surgeon: Marshell Garfinkel, MD;  Location: [facility];  Service: Cardiopulmonary;  Laterality: Bilateral;  . FOOT SURGERY Right    bunion and broke toe and then realign toe  . IR FLUORO GUIDE PORT INSERTION RIGHT  10/25/2017  . IR US GUIDE VASC ACCESS RIGHT  10/25/2017  . MASTECTOMY Bilateral 08/12/2012   LEFT BREAST CANCER  . thoracic back surgery  05/2015  . VAGINAL HYSTERECTOMY  1999   Social History:  Social History   Socioeconomic History  . Marital status: Married    Spouse name: Ronalee Belts  . Number of children: 1  . Years of education: Not on file  . Highest education level: Not on file  Occupational History  . Occupation: disability  Social Needs  . Financial resource strain: Not on file  . Food insecurity:    Worry: Not on file    Inability: Not on file  . Transportation needs:    Medical: Not on file    Non-medical: Not on file  Tobacco Use  . Smoking status: Former Smoker    Last attempt to quit: 05/24/2012    Years since quitting: 5.6  . Smokeless tobacco: Never Used  Substance and Sexual Activity  . Alcohol use: Yes    Alcohol/week: 0.0 oz    Comment: socially  . Drug use: No  . Sexual activity: Not on file  Lifestyle  . Physical activity:    Days per week: Not on file    Minutes per session: Not on file  . Stress: Not on file  Relationships  . Social connections:    Talks on phone: Not on file    Gets together: Not on  file    Attends religious service: Not on file    Active member of club or organization: Not on file    Attends meetings of clubs or organizations: Not on file    Relationship status: Not on file  . Intimate partner violence:    Fear of current or ex partner: Not on file    Emotionally abused: Not on file    Physically abused: Not on file    Forced sexual activity: Not on file  Other Topics Concern  . Not on file  Social History Narrative  . Not on file   Family History:  Family History  Problem Relation Age of Onset  . Lung cancer Mother   . Hypertension Mother   . Lung cancer Father   . Rheum arthritis Father   . Hypertension Father   . Colon cancer Neg Hx     Review of Systems: Constitutional: Denies fevers, chills or abnormal weight loss Eyes: Denies blurriness of vision Ears, nose, mouth, throat, and face: Denies mucositis or sore throat Respiratory: Denies cough, dyspnea or wheezes Cardiovascular: Denies palpitation, chest discomfort or lower extremity swelling Gastrointestinal:  +nausea GU: Denies dysuria or incontinence Skin: Denies abnormal skin rashes Neurological: Per HPI Musculoskeletal: Denies joint pain, back or neck discomfort. No decrease in ROM Behavioral/Psych: Denies anxiety, disturbance in thought content, and mood instability   Physical Exam: Vitals:   01/31/18 1226  BP: 101/78  Pulse: 98  Resp: 18  Temp: (!) 97.5 F (36.4 C)  SpO2: 100%   KPS: 70. General: Alert, cooperative, pleasant, in no acute distress Head: Hair loss EENT: No conjunctival injection or scleral icterus. Oral mucosa moist Lungs: Resp effort normal Cardiac: Regular rate and rhythm Abdomen: Soft, non-distended abdomen Skin: No rashes cyanosis or petechiae. Extremities: No clubbing or edema  Neurologic Exam: Mental Status: Awake, alert, attentive to examiner. Oriented to self and environment. Language is fluent with intact comprehension with hypophonia.  Cranial  Nerves: Visual acuity is grossly normal. Visual fields are full. Extra-ocular movements intact with subtle nystagmus. No ptosis. Face is symmetric, tongue midline. Motor: Tone and bulk are normal. Power is full in both arms and legs. Reflexes are symmetric, no pathologic reflexes present. Intact finger to nose bilaterally Sensory: Intact to light touch and temperature Gait: Independent but slow and purposeful  Labs: I have reviewed the data as listed    Component Value Date/Time   NA 137 01/31/2018 1136   K 3.1 (L) 01/31/2018 1136   CL 101 01/31/2018 1136   CO2 26 01/31/2018 1136   GLUCOSE 118 01/31/2018 1136   BUN 13 01/31/2018 1136   CREATININE 0.69 01/31/2018 1136   CALCIUM 9.6 01/31/2018 1136   PROT 7.4 01/31/2018 1136   ALBUMIN 3.6 01/31/2018 1136   AST 63 (H) 01/31/2018 1136   ALT 58 (H) 01/31/2018 1136   ALKPHOS 127 01/31/2018 1136   BILITOT <0.2 (L) 01/31/2018 1136   GFRNONAA >60 01/31/2018 1136   GFRAA >60 01/31/2018 1136   Lab Results  Component Value Date   WBC 1.7 (L) 01/31/2018   NEUTROABS 1.1 (L) 01/31/2018   HGB 11.6 01/31/2018   HCT 33.3 (L) 01/31/2018   MCV 103.9 (H) 01/31/2018   PLT 154 01/31/2018    Imaging: Imaging:  [place] Clinician Interpretation: I have personally reviewed the CNS images as listed.  My interpretation, in the context of the patient's clinical presentation, is stable disease  Ct Chest W Contrast  Result Date: 01/20/2018 CLINICAL DATA:  History of metastatic lung cancer. History of breast cancer. Patient status post chemotherapy and radiation. EXAM: CT CHEST, ABDOMEN, AND PELVIS WITH CONTRAST TECHNIQUE: Multidetector CT imaging of the chest, abdomen and pelvis was performed following the standard protocol during bolus administration of intravenous contrast. CONTRAST:  163mL ISOVUE-300 IOPAMIDOL (ISOVUE-300) INJECTION 61% COMPARISON:  PET-CT 10/19/2017 FINDINGS: CT CHEST FINDINGS Cardiovascular: Right anterior chest wall Port-A-Cath is  present with tip terminating in the superior vena cava. Trace fluid superior pericardial recess. Mediastinum/Nodes: Postsurgical changes left axilla. No right axillary adenopathy. Interval decrease in size of right paratracheal lymph node measuring 1.1 cm (image 25; series 2), previously 1.9 cm.  Interval decrease in size of subcarinal lymph node measuring 0.9 cm (image 33; series 2), previously 2.3 cm. Interval decrease in size of left axillary lymph node measuring 0.6 cm (image 15; series 2), previously 1.1 cm. Esophagus is normal in appearance. Lungs/Pleura: Central airways are patent. Centrilobular and paraseptal emphysematous change. Interval decrease in size of left upper lobe mass measuring 5.5 x 3.1 cm (image 79; series 4), previously 5.9 x 8.4 cm. Interval decrease in previously described surrounding ground-glass opacities with interlobular septal thickening. Re demonstrated small nodules within the medial left upper lobe including a 3 mm subpleural left upper lobe nodule (image 36; series 4). Unchanged 3 mm right upper lobe nodule (image 20; series 4). Unchanged adjacent scattered small nodularity within the right upper lobe. Stable 3 mm right lower lobe nodule (image 129; series 4). No pleural effusion or pneumothorax. Musculoskeletal: Interval development of a 1.7 cm sclerotic lesion within the left humeral head (image 10; series 4). Thoracic spine degenerative changes. CT ABDOMEN PELVIS FINDINGS Hepatobiliary: There is a 0.7 cm low-attenuation lesion within the hepatic dome (image 56; series 2). Gallbladder is unremarkable. No intrahepatic or extrahepatic biliary ductal dilatation. Pancreas: Unremarkable Spleen: Unremarkable Adrenals/Urinary Tract: Interval decrease in size of left adrenal nodule measuring 1.5 cm (image 62; series 2), previously 2.4 cm. Right adrenal gland is unremarkable. Kidneys enhance symmetrically with contrast. Subcentimeter low-attenuation lesion too small to characterize inferior  pole right kidney. Urinary bladder is decompressed. Stomach/Bowel: No abnormal bowel wall thickening or evidence for bowel obstruction. Normal morphology of the stomach. No free fluid or free intraperitoneal air. Vascular/Lymphatic: Normal caliber abdominal aorta. Peripheral calcified atherosclerotic plaque. No retroperitoneal lymphadenopathy. Reproductive: Status post hysterectomy. Adnexal structures unremarkable. Other: None. Musculoskeletal: Lumbar spine degenerative changes. There is a 2.1 cm sclerotic lesion within the left acetabulum (image 106; series 2). IMPRESSION: 1. Interval decrease in size of left upper lobe mass with improving surrounding ground-glass and consolidative opacities. 2. Interval decrease in size of mediastinal and left axillary adenopathy. 3. Subcentimeter low-attenuation lesion within the liver, indeterminate. Hepatic metastatic disease not excluded. 4.  Emphysema (ICD10-J43.9). Electronically Signed   By: Lovey Newcomer M.D.   On: 01/20/2018 13:00   Mr Jeri Cos AV Contrast  Result Date: 01/27/2018 CLINICAL DATA:  Headache, confusion, tremors, difficulty walking and talking. Follow-up metastatic lung cancer status post whole brain radiation November 10, 2017. EXAM: MRI HEAD WITHOUT AND WITH CONTRAST TECHNIQUE: Multiplanar, multiecho pulse sequences of the brain and surrounding structures were obtained without and with intravenous contrast. CONTRAST:  104mL MULTIHANCE GADOBENATE DIMEGLUMINE 529 MG/ML IV SOLN COMPARISON:  MRI of the head October 23, 2017 FINDINGS: BRAIN New Lesions: None. Larger lesions: None. Stable or Smaller lesions: Approximately 8 supratentorial enhancing residual metastasis all of which are smaller than prior examination. Mild susceptibility artifact associated with multiple treated metastasis resulting in spurious reduced diffusion. Decreased vasogenic edema. For example: 9 mm lesion right temporal lobe was 16 mm (series 12, image 80). 3 mm lesion left occipital lobe was 11  mm (series 12, image 100). 5 mm lesion left parietal lobe was 8 mm (series 12, image 114). Brain findings: New generalized parenchymal brain edema most conspicuous within pons. No reduced diffusion to suggest acute ischemia. Mild parenchymal brain volume loss. No midline shift, mass effect. No abnormal extra-axial fluid collections nor abnormal extra-axial enhancement. Vascular: Major intracranial vascular flow voids preserved. Skull and upper cervical spine: No suspicious calvarial bone marrow signal. Sinuses/Orbits: Trace mastoid effusions. Paranasal sinuses are well aerated. Ocular globes  and orbital contents are normal. Other: None. IMPRESSION: 1. Regression of intracranial metastasis disease as evidenced by resolution of many and diminution of other intracranial metastasis. No new metastasis. 2. Total of 8 enhancing brain metastases. 3. Generalized edema consistent with recent whole-brain radiation treatment changes. Electronically Signed   By: Elon Alas M.D.   On: 01/27/2018 20:53   Ct Abdomen Pelvis W Contrast  Result Date: 01/20/2018 CLINICAL DATA:  History of metastatic lung cancer. History of breast cancer. Patient status post chemotherapy and radiation. EXAM: CT CHEST, ABDOMEN, AND PELVIS WITH CONTRAST TECHNIQUE: Multidetector CT imaging of the chest, abdomen and pelvis was performed following the standard protocol during bolus administration of intravenous contrast. CONTRAST:  191mL ISOVUE-300 IOPAMIDOL (ISOVUE-300) INJECTION 61% COMPARISON:  PET-CT 10/19/2017 FINDINGS: CT CHEST FINDINGS Cardiovascular: Right anterior chest wall Port-A-Cath is present with tip terminating in the superior vena cava. Trace fluid superior pericardial recess. Mediastinum/Nodes: Postsurgical changes left axilla. No right axillary adenopathy. Interval decrease in size of right paratracheal lymph node measuring 1.1 cm (image 25; series 2), previously 1.9 cm. Interval decrease in size of subcarinal lymph node  measuring 0.9 cm (image 33; series 2), previously 2.3 cm. Interval decrease in size of left axillary lymph node measuring 0.6 cm (image 15; series 2), previously 1.1 cm. Esophagus is normal in appearance. Lungs/Pleura: Central airways are patent. Centrilobular and paraseptal emphysematous change. Interval decrease in size of left upper lobe mass measuring 5.5 x 3.1 cm (image 79; series 4), previously 5.9 x 8.4 cm. Interval decrease in previously described surrounding ground-glass opacities with interlobular septal thickening. Re demonstrated small nodules within the medial left upper lobe including a 3 mm subpleural left upper lobe nodule (image 36; series 4). Unchanged 3 mm right upper lobe nodule (image 20; series 4). Unchanged adjacent scattered small nodularity within the right upper lobe. Stable 3 mm right lower lobe nodule (image 129; series 4). No pleural effusion or pneumothorax. Musculoskeletal: Interval development of a 1.7 cm sclerotic lesion within the left humeral head (image 10; series 4). Thoracic spine degenerative changes. CT ABDOMEN PELVIS FINDINGS Hepatobiliary: There is a 0.7 cm low-attenuation lesion within the hepatic dome (image 56; series 2). Gallbladder is unremarkable. No intrahepatic or extrahepatic biliary ductal dilatation. Pancreas: Unremarkable Spleen: Unremarkable Adrenals/Urinary Tract: Interval decrease in size of left adrenal nodule measuring 1.5 cm (image 62; series 2), previously 2.4 cm. Right adrenal gland is unremarkable. Kidneys enhance symmetrically with contrast. Subcentimeter low-attenuation lesion too small to characterize inferior pole right kidney. Urinary bladder is decompressed. Stomach/Bowel: No abnormal bowel wall thickening or evidence for bowel obstruction. Normal morphology of the stomach. No free fluid or free intraperitoneal air. Vascular/Lymphatic: Normal caliber abdominal aorta. Peripheral calcified atherosclerotic plaque. No retroperitoneal lymphadenopathy.  Reproductive: Status post hysterectomy. Adnexal structures unremarkable. Other: None. Musculoskeletal: Lumbar spine degenerative changes. There is a 2.1 cm sclerotic lesion within the left acetabulum (image 106; series 2). IMPRESSION: 1. Interval decrease in size of left upper lobe mass with improving surrounding ground-glass and consolidative opacities. 2. Interval decrease in size of mediastinal and left axillary adenopathy. 3. Subcentimeter low-attenuation lesion within the liver, indeterminate. Hepatic metastatic disease not excluded. 4.  Emphysema (ICD10-J43.9). Electronically Signed   By: Lovey Newcomer M.D.   On: 01/20/2018 13:00     Assessment/Plan Brain metastases (HCC) - Plan: MR BRAIN W WO CONTRAST   Diana Schwartz is clinically and radiographically stable today.  She should continue Keppra 1000mg  BID.  She may discontinue decadron if tolerated.  Can use  Tylenol as needed for breakthrough headaches.  We again counseled her on seizure safety, including driving restrictions.    We appreciate the opportunity to participate in the care of Diana Schwartz.   She will return to clinic in 3 months with brain MRI for evaluation.  All questions were answered. The patient knows to call the clinic with any problems, questions or concerns. No barriers to learning were detected.  The total time spent in the encounter was 40 minutes and more than 50% was on counseling and review of test results   Ventura Sellers, MD Medical Director of Neuro-Oncology [facility] at [place] 01/31/18 1:15 PM

## 2018-01-31 NOTE — Telephone Encounter (Signed)
Gave pt avs and calendar with appts per 6/10 los

## 2018-02-02 ENCOUNTER — Telehealth: Payer: Self-pay | Admitting: Medical Oncology

## 2018-02-02 ENCOUNTER — Ambulatory Visit: Payer: 59 | Admitting: Internal Medicine

## 2018-02-02 NOTE — Telephone Encounter (Signed)
Headaches worse since Monday after  last dose of decadron 1 mg . She was up all last night because of headache. She took a hydrocodone at 1030 and is still asleep.  She continues to have nausea and vomiting daily post chemo 6/3 . She took ondansetron yesterday , but still vomiting . She stopped compazine because she does not want to take a lot of pills. I told husband to alternate compazine and zofran for nausea. Will ask Dr Mickeal Skinner to advise. Per Dr. Mickeal Skinner,  I  instructed husband to restart pt on decadron 1 mg /day and see how her headache responds and let us know if it does not help. He said he has enough decadron. If need she can take an occasional hydrocodone .

## 2018-02-07 ENCOUNTER — Inpatient Hospital Stay: Payer: 59

## 2018-02-07 DIAGNOSIS — Z95828 Presence of other vascular implants and grafts: Secondary | ICD-10-CM

## 2018-02-07 DIAGNOSIS — Z5111 Encounter for antineoplastic chemotherapy: Secondary | ICD-10-CM | POA: Diagnosis not present

## 2018-02-07 DIAGNOSIS — C3490 Malignant neoplasm of unspecified part of unspecified bronchus or lung: Secondary | ICD-10-CM

## 2018-02-07 LAB — CBC WITH DIFFERENTIAL (CANCER CENTER ONLY)
BASOS ABS: 0 10*3/uL (ref 0.0–0.1)
Basophils Relative: 0 %
EOS ABS: 0 10*3/uL (ref 0.0–0.5)
EOS PCT: 1 %
HCT: 33.4 % — ABNORMAL LOW (ref 34.8–46.6)
Hemoglobin: 11.2 g/dL — ABNORMAL LOW (ref 11.6–15.9)
LYMPHS PCT: 27 %
Lymphs Abs: 0.8 10*3/uL — ABNORMAL LOW (ref 0.9–3.3)
MCH: 35.1 pg — ABNORMAL HIGH (ref 25.1–34.0)
MCHC: 33.5 g/dL (ref 31.5–36.0)
MCV: 104.7 fL — ABNORMAL HIGH (ref 79.5–101.0)
MONO ABS: 0.4 10*3/uL (ref 0.1–0.9)
Monocytes Relative: 13 %
Neutro Abs: 1.7 10*3/uL (ref 1.5–6.5)
Neutrophils Relative %: 59 %
PLATELETS: 108 10*3/uL — AB (ref 145–400)
RBC: 3.19 MIL/uL — AB (ref 3.70–5.45)
RDW: 16.8 % — AB (ref 11.2–14.5)
WBC: 2.9 10*3/uL — AB (ref 3.9–10.3)

## 2018-02-07 LAB — CMP (CANCER CENTER ONLY)
ALBUMIN: 3.5 g/dL (ref 3.5–5.0)
ALK PHOS: 168 U/L — AB (ref 40–150)
ALT: 57 U/L — ABNORMAL HIGH (ref 0–55)
AST: 44 U/L — AB (ref 5–34)
Anion gap: 8 (ref 3–11)
BILIRUBIN TOTAL: 0.3 mg/dL (ref 0.2–1.2)
BUN: 13 mg/dL (ref 7–26)
CO2: 24 mmol/L (ref 22–29)
Calcium: 9.1 mg/dL (ref 8.4–10.4)
Chloride: 105 mmol/L (ref 98–109)
Creatinine: 0.66 mg/dL (ref 0.60–1.10)
GFR, Est AFR Am: >60 mL/min (ref >60)
GLUCOSE: 115 mg/dL (ref 70–140)
POTASSIUM: 3.6 mmol/L (ref 3.5–5.1)
Sodium: 137 mmol/L (ref 136–145)
TOTAL PROTEIN: 7.1 g/dL (ref 6.4–8.3)

## 2018-02-07 MED ORDER — HEPARIN SOD (PORK) LOCK FLUSH 100 UNIT/ML IV SOLN
500.0000 [IU] | Freq: Once | INTRAVENOUS | Status: AC | PRN
  Administered 2018-02-07: 500 [IU]
  Filled 2018-02-07: qty 5

## 2018-02-07 MED ORDER — SODIUM CHLORIDE 0.9% FLUSH
10.0000 mL | INTRAVENOUS | Status: DC | PRN
  Administered 2018-02-07: 10 mL
  Filled 2018-02-07: qty 10

## 2018-02-11 NOTE — Assessment & Plan Note (Addendum)
This is a very pleasant 52 year old white female with metastatic non-small cell lung cancer, adenocarcinoma with no actionable mutations.  PDL 1 expression is 50% but the patient has significant lupus.  She is status post whole brain irradiation.  She is currently on treatment with systemic chemotherapy with carboplatin, Alimta and Avastin status post 4 cycles. The patient has been tolerating this treatment well except for fatigue and nausea. The patient felt worse with cycle 4 of her treatment.  She was given the option of delaying the start of cycle 5 x 1 week versus proceeding as planned today.  The patient would like to proceed.  She will proceed with cycle 5 of her treatment as scheduled today.  The patient will follow-up for weekly labs and a visit in 3 weeks for evaluation prior to cycle #6 of her treatment.  For nausea, I have refilled her Zofran.  For insomnia, I have refilled her temazepam.  Her headaches seem better with dexamethasone 1 mg daily.  She will continue this under the direction of Dr. Mickeal Skinner.  She is followed closely by radiation oncology and neuro-oncology for her brain metastases.  The patient was advised to call immediately if she has any concerning symptoms in the interval. The patient voices understanding of current disease status and treatment options and is in agreement with the current care plan.

## 2018-02-11 NOTE — Progress Notes (Signed)
[place] OFFICE PROGRESS NOTE  [name], MD [place] [address]  DIAGNOSIS: Stage IV non-small cell lung cancer, adenocarcinoma presenting with a left middle lobe mass that abuts the mediastinal border/pericardium and the pleural surface. The patient was found to have adrenal and bone metastasis andover 20 brain metastases.  PD-L1 50%  PRIOR THERAPY: Whole brain radiation and palliative radiation to the left lung on 11/10/2017.  CURRENT THERAPY: Systemic chemotherapy with carboplatin for an AUC of 5, Alimta 500 mg/m2, and Avastin 15 mg/kg every 3 weeks. First dose expected on 11/22/2017.  Status post 4 cycles.  INTERVAL HISTORY: Diana Schwartz 52 y.o. female returns for routine follow-up visit accompanied by her husband and daughter.  The patient reports that she had a difficult time with her last cycle of chemotherapy.  She had more fatigue, nausea and vomiting, and arthralgias.  She also had worsening of her headaches and had to resume dexamethasone 1 mg daily.  Her headaches have improved.  The patient reports that she vomited last evening but nausea is better controlled overall with Zofran.  She does not feel as though the Compazine works very well.  Patient denies fevers and chills.  Denies chest pain, shortness breath, cough, hemoptysis.  Denies constipation and diarrhea.  Reports a decreased appetite and she has lost a few more pounds since her last visit.  Patient is here for evaluation prior to cycle 5 of her treatment.  MEDICAL HISTORY: Past Medical History:  Diagnosis Date  . Anxiety   . Autoimmune hepatitis ([place])    no current med.  . Breast cancer [facility]) 2013   Left breast cancer. Treated at [facility]. Per notes, nvasive lobular carcinoma. S/P bilateral mastectomy with recontruction and Tamoxifen X5 years.   . Carpal tunnel syndrome of left wrist   . Degenerative disc disease, lumbar   . Depression   . Fibromyalgia   . Lupus  (systemic lupus erythematosus) ([place])   . Osteoarthritis   . Rash    arms, chest - due to lupus  . Raynaud disease   . Rheumatoid arthritis ([place])   . Sjoegren syndrome     ALLERGIES:  is allergic to oxycodone; doxycycline; and prednisone.  MEDICATIONS:  Current Outpatient Medications  Medication Sig Dispense Refill  . acetaminophen (TYLENOL) 500 MG tablet Take 1,000 mg by mouth every 6 (six) hours as needed.    Marland Kitchen amoxicillin-clavulanate (AUGMENTIN) 875-125 MG tablet Take 1 tablet by mouth 2 (two) times daily.  0  . azaTHIOprine (IMURAN) 50 MG tablet Take 50 mg by mouth 2 (two) times daily.    . benzonatate (TESSALON) 200 MG capsule Take 1 capsule (200 mg total) by mouth 3 (three) times daily as needed for cough. 30 capsule 0  . cetirizine (ZYRTEC) 10 MG tablet Take 10 mg by mouth daily.    . Cholecalciferol (HM VITAMIN D3) 4000 units CAPS Take by mouth.    . dexamethasone (DECADRON) 2 MG tablet Take 1 tablet (2 mg total) by mouth 2 (two) times daily. 60 tablet 0  . folic acid (FOLVITE) 1 MG tablet Take 1 tablet (1 mg total) by mouth daily. 30 tablet 2  . HYDROcodone-acetaminophen (NORCO/VICODIN) 5-325 MG tablet     . hydroxychloroquine (PLAQUENIL) 200 MG tablet Take 200 mg by mouth 2 (two) times daily.    Marland Kitchen levETIRAcetam (KEPPRA) 1000 MG tablet Take 1 tablet (1,000 mg total) by mouth 2 (two) times daily. 60 tablet 5  . lidocaine-prilocaine (  EMLA) cream Apply 1 application topically as needed. 30 g 0  . omeprazole (PRILOSEC) 40 MG capsule Take by mouth daily.  3  . ondansetron (ZOFRAN ODT) 4 MG disintegrating tablet Take 1 tablet (4 mg total) by mouth every 8 (eight) hours as needed for nausea or vomiting. 20 tablet 1  . potassium chloride SA (K-DUR,KLOR-CON) 20 MEQ tablet Take 1 tablet (20 mEq total) by mouth daily. 30 tablet 0  . senna-docusate (SENNA S) 8.6-50 MG tablet Take 1 tablet by mouth daily.    . temazepam (RESTORIL) 30 MG capsule TAKE 1 CAPSULE BY MOUTH AT BEDTIME AS NEEDED  FOR SLEEP. DO NOT TAKE WITH XANAX (ALPRAZOLAM) 30 capsule 0  . venlafaxine XR (EFFEXOR-XR) 150 MG 24 hr capsule Take 2 capsules by mouth daily with breakfast.     . dexamethasone (DECADRON) 4 MG tablet Take 1 tablet twice a day the day before, day of, and day after each cycle of chemotherapy. (Patient not taking: Reported on 01/31/2018) 30 tablet 1  . prochlorperazine (COMPAZINE) 10 MG tablet Take 1 tablet (10 mg total) by mouth every 6 (six) hours as needed for nausea or vomiting. (Patient not taking: Reported on 02/14/2018) 30 tablet 1   No current facility-administered medications for this visit.     SURGICAL HISTORY:  Past Surgical History:  Procedure Laterality Date  . ANTERIOR CERVICAL DECOMP/DISCECTOMY FUSION  02/06/2004   C5-6, C6-7  . AXILLARY NODE DISSECTION Left 08/2012  . BREAST RECONSTRUCTION  06/2013  . CARPAL TUNNEL RELEASE Right 1989  . CARPAL TUNNEL RELEASE Left 10/02/2013   Procedure: LEFT CARPAL TUNNEL RELEASE;  Surgeon: Linna Hoff, MD;  Location: [facility];  Service: Orthopedics;  Laterality: Left;  Local Anesthesia with IV Sedation  . ENDOBRONCHIAL ULTRASOUND Bilateral 10/27/2017   Procedure: ENDOBRONCHIAL ULTRASOUND;  Surgeon: Marshell Garfinkel, MD;  Location: [facility];  Service: Cardiopulmonary;  Laterality: Bilateral;  . FOOT SURGERY Right    bunion and broke toe and then realign toe  . IR FLUORO GUIDE PORT INSERTION RIGHT  10/25/2017  . IR US GUIDE VASC ACCESS RIGHT  10/25/2017  . MASTECTOMY Bilateral 08/12/2012   LEFT BREAST CANCER  . thoracic back surgery  05/2015  . VAGINAL HYSTERECTOMY  1999    REVIEW OF SYSTEMS:   Review of Systems  Constitutional: Negative for appetite change, chills, fever. Positive for fatigue and weight loss. HENT:   Negative for mouth sores, nosebleeds, sore throat and trouble swallowing.   Eyes: Negative for eye problems and icterus.  Respiratory: Negative for cough, hemoptysis, shortness of breath and wheezing.    Cardiovascular: Negative for chest pain and leg swelling.  Gastrointestinal: Negative for abdominal pain, constipation, diarrhea.  Positive for nausea and vomiting.  Genitourinary: Negative for bladder incontinence, difficulty urinating, dysuria, frequency and hematuria.   Musculoskeletal: Positive for generalized arthralgias.  Skin: Negative for itching and rash.  Neurological: Negative for dizziness, extremity weakness, gait problem, light-headedness and seizures. Positive for headaches which have improved with dexamethasone 1 mg daily. Hematological: Negative for adenopathy. Does not bruise/bleed easily.  Psychiatric/Behavioral: Negative for confusion, depression and sleep disturbance. The patient is not nervous/anxious.     PHYSICAL EXAMINATION:  Blood pressure 107/82, pulse 97, temperature 97.6 F (36.4 C), temperature source Oral, resp. rate 18, height 5' 2" (1.575 m), weight 111 lb 6.4 oz (50.5 kg), SpO2 95 %.  ECOG PERFORMANCE STATUS: 1 - Symptomatic but completely ambulatory  Physical Exam  Constitutional: Oriented to person, place, and time and  well-developed, well-nourished, and in no distress. No distress.  HENT:  Head: Normocephalic and atraumatic.  Mouth/Throat: Oropharynx is clear and moist. No oropharyngeal exudate.  Eyes: Conjunctivae are normal. Right eye exhibits no discharge. Left eye exhibits no discharge. No scleral icterus.  Neck: Normal range of motion. Neck supple.  Cardiovascular: Normal rate, regular rhythm, normal heart sounds and intact distal pulses.   Pulmonary/Chest: Effort normal and breath sounds normal. No respiratory distress. No wheezes. No rales.  Abdominal: Soft. Bowel sounds are normal. Exhibits no distension and no mass. There is no tenderness.  Musculoskeletal: Normal range of motion. Exhibits no edema.  Lymphadenopathy:    No cervical adenopathy.  Neurological: Alert and oriented to person, place, and time. Exhibits normal muscle tone. Gait  normal. Coordination normal.  Skin: Skin is warm and dry. No rash noted. Not diaphoretic. No erythema. No pallor.  Psychiatric: Mood, memory and judgment normal.  Vitals reviewed.  LABORATORY DATA: Lab Results  Component Value Date   WBC 3.6 (L) 02/14/2018   HGB 11.5 (L) 02/14/2018   HCT 34.2 (L) 02/14/2018   MCV 106.2 (H) 02/14/2018   PLT 207 02/14/2018      Chemistry      Component Value Date/Time   NA 138 02/14/2018 0858   K 3.6 02/14/2018 0858   CL 105 02/14/2018 0858   CO2 24 02/14/2018 0858   BUN 7 02/14/2018 0858   CREATININE 0.63 02/14/2018 0858      Component Value Date/Time   CALCIUM 9.5 02/14/2018 0858   ALKPHOS 195 (H) 02/14/2018 0858   AST 47 (H) 02/14/2018 0858   ALT 43 02/14/2018 0858   BILITOT 0.2 02/14/2018 0858       RADIOGRAPHIC STUDIES:  Ct Chest W Contrast  Result Date: 01/20/2018 CLINICAL DATA:  History of metastatic lung cancer. History of breast cancer. Patient status post chemotherapy and radiation. EXAM: CT CHEST, ABDOMEN, AND PELVIS WITH CONTRAST TECHNIQUE: Multidetector CT imaging of the chest, abdomen and pelvis was performed following the standard protocol during bolus administration of intravenous contrast. CONTRAST:  157m ISOVUE-300 IOPAMIDOL (ISOVUE-300) INJECTION 61% COMPARISON:  PET-CT 10/19/2017 FINDINGS: CT CHEST FINDINGS Cardiovascular: Right anterior chest wall Port-A-Cath is present with tip terminating in the superior vena cava. Trace fluid superior pericardial recess. Mediastinum/Nodes: Postsurgical changes left axilla. No right axillary adenopathy. Interval decrease in size of right paratracheal lymph node measuring 1.1 cm (image 25; series 2), previously 1.9 cm. Interval decrease in size of subcarinal lymph node measuring 0.9 cm (image 33; series 2), previously 2.3 cm. Interval decrease in size of left axillary lymph node measuring 0.6 cm (image 15; series 2), previously 1.1 cm. Esophagus is normal in appearance. Lungs/Pleura:  Central airways are patent. Centrilobular and paraseptal emphysematous change. Interval decrease in size of left upper lobe mass measuring 5.5 x 3.1 cm (image 79; series 4), previously 5.9 x 8.4 cm. Interval decrease in previously described surrounding ground-glass opacities with interlobular septal thickening. Re demonstrated small nodules within the medial left upper lobe including a 3 mm subpleural left upper lobe nodule (image 36; series 4). Unchanged 3 mm right upper lobe nodule (image 20; series 4). Unchanged adjacent scattered small nodularity within the right upper lobe. Stable 3 mm right lower lobe nodule (image 129; series 4). No pleural effusion or pneumothorax. Musculoskeletal: Interval development of a 1.7 cm sclerotic lesion within the left humeral head (image 10; series 4). Thoracic spine degenerative changes. CT ABDOMEN PELVIS FINDINGS Hepatobiliary: There is a 0.7 cm low-attenuation lesion within  the hepatic dome (image 56; series 2). Gallbladder is unremarkable. No intrahepatic or extrahepatic biliary ductal dilatation. Pancreas: Unremarkable Spleen: Unremarkable Adrenals/Urinary Tract: Interval decrease in size of left adrenal nodule measuring 1.5 cm (image 62; series 2), previously 2.4 cm. Right adrenal gland is unremarkable. Kidneys enhance symmetrically with contrast. Subcentimeter low-attenuation lesion too small to characterize inferior pole right kidney. Urinary bladder is decompressed. Stomach/Bowel: No abnormal bowel wall thickening or evidence for bowel obstruction. Normal morphology of the stomach. No free fluid or free intraperitoneal air. Vascular/Lymphatic: Normal caliber abdominal aorta. Peripheral calcified atherosclerotic plaque. No retroperitoneal lymphadenopathy. Reproductive: Status post hysterectomy. Adnexal structures unremarkable. Other: None. Musculoskeletal: Lumbar spine degenerative changes. There is a 2.1 cm sclerotic lesion within the left acetabulum (image 106; series  2). IMPRESSION: 1. Interval decrease in size of left upper lobe mass with improving surrounding ground-glass and consolidative opacities. 2. Interval decrease in size of mediastinal and left axillary adenopathy. 3. Subcentimeter low-attenuation lesion within the liver, indeterminate. Hepatic metastatic disease not excluded. 4.  Emphysema (ICD10-J43.9). Electronically Signed   By: Lovey Newcomer M.D.   On: 01/20/2018 13:00   Mr Jeri Cos EU Contrast  Result Date: 01/27/2018 CLINICAL DATA:  Headache, confusion, tremors, difficulty walking and talking. Follow-up metastatic lung cancer status post whole brain radiation November 10, 2017. EXAM: MRI HEAD WITHOUT AND WITH CONTRAST TECHNIQUE: Multiplanar, multiecho pulse sequences of the brain and surrounding structures were obtained without and with intravenous contrast. CONTRAST:  14m MULTIHANCE GADOBENATE DIMEGLUMINE 529 MG/ML IV SOLN COMPARISON:  MRI of the head October 23, 2017 FINDINGS: BRAIN New Lesions: None. Larger lesions: None. Stable or Smaller lesions: Approximately 8 supratentorial enhancing residual metastasis all of which are smaller than prior examination. Mild susceptibility artifact associated with multiple treated metastasis resulting in spurious reduced diffusion. Decreased vasogenic edema. For example: 9 mm lesion right temporal lobe was 16 mm (series 12, image 80). 3 mm lesion left occipital lobe was 11 mm (series 12, image 100). 5 mm lesion left parietal lobe was 8 mm (series 12, image 114). Brain findings: New generalized parenchymal brain edema most conspicuous within pons. No reduced diffusion to suggest acute ischemia. Mild parenchymal brain volume loss. No midline shift, mass effect. No abnormal extra-axial fluid collections nor abnormal extra-axial enhancement. Vascular: Major intracranial vascular flow voids preserved. Skull and upper cervical spine: No suspicious calvarial bone marrow signal. Sinuses/Orbits: Trace mastoid effusions. Paranasal  sinuses are well aerated. Ocular globes and orbital contents are normal. Other: None. IMPRESSION: 1. Regression of intracranial metastasis disease as evidenced by resolution of many and diminution of other intracranial metastasis. No new metastasis. 2. Total of 8 enhancing brain metastases. 3. Generalized edema consistent with recent whole-brain radiation treatment changes. Electronically Signed   By: CElon AlasM.D.   On: 01/27/2018 20:53   Ct Abdomen Pelvis W Contrast  Result Date: 01/20/2018 CLINICAL DATA:  History of metastatic lung cancer. History of breast cancer. Patient status post chemotherapy and radiation. EXAM: CT CHEST, ABDOMEN, AND PELVIS WITH CONTRAST TECHNIQUE: Multidetector CT imaging of the chest, abdomen and pelvis was performed following the standard protocol during bolus administration of intravenous contrast. CONTRAST:  1060mISOVUE-300 IOPAMIDOL (ISOVUE-300) INJECTION 61% COMPARISON:  PET-CT 10/19/2017 FINDINGS: CT CHEST FINDINGS Cardiovascular: Right anterior chest wall Port-A-Cath is present with tip terminating in the superior vena cava. Trace fluid superior pericardial recess. Mediastinum/Nodes: Postsurgical changes left axilla. No right axillary adenopathy. Interval decrease in size of right paratracheal lymph node measuring 1.1 cm (image 25; series 2), previously  1.9 cm. Interval decrease in size of subcarinal lymph node measuring 0.9 cm (image 33; series 2), previously 2.3 cm. Interval decrease in size of left axillary lymph node measuring 0.6 cm (image 15; series 2), previously 1.1 cm. Esophagus is normal in appearance. Lungs/Pleura: Central airways are patent. Centrilobular and paraseptal emphysematous change. Interval decrease in size of left upper lobe mass measuring 5.5 x 3.1 cm (image 79; series 4), previously 5.9 x 8.4 cm. Interval decrease in previously described surrounding ground-glass opacities with interlobular septal thickening. Re demonstrated small nodules  within the medial left upper lobe including a 3 mm subpleural left upper lobe nodule (image 36; series 4). Unchanged 3 mm right upper lobe nodule (image 20; series 4). Unchanged adjacent scattered small nodularity within the right upper lobe. Stable 3 mm right lower lobe nodule (image 129; series 4). No pleural effusion or pneumothorax. Musculoskeletal: Interval development of a 1.7 cm sclerotic lesion within the left humeral head (image 10; series 4). Thoracic spine degenerative changes. CT ABDOMEN PELVIS FINDINGS Hepatobiliary: There is a 0.7 cm low-attenuation lesion within the hepatic dome (image 56; series 2). Gallbladder is unremarkable. No intrahepatic or extrahepatic biliary ductal dilatation. Pancreas: Unremarkable Spleen: Unremarkable Adrenals/Urinary Tract: Interval decrease in size of left adrenal nodule measuring 1.5 cm (image 62; series 2), previously 2.4 cm. Right adrenal gland is unremarkable. Kidneys enhance symmetrically with contrast. Subcentimeter low-attenuation lesion too small to characterize inferior pole right kidney. Urinary bladder is decompressed. Stomach/Bowel: No abnormal bowel wall thickening or evidence for bowel obstruction. Normal morphology of the stomach. No free fluid or free intraperitoneal air. Vascular/Lymphatic: Normal caliber abdominal aorta. Peripheral calcified atherosclerotic plaque. No retroperitoneal lymphadenopathy. Reproductive: Status post hysterectomy. Adnexal structures unremarkable. Other: None. Musculoskeletal: Lumbar spine degenerative changes. There is a 2.1 cm sclerotic lesion within the left acetabulum (image 106; series 2). IMPRESSION: 1. Interval decrease in size of left upper lobe mass with improving surrounding ground-glass and consolidative opacities. 2. Interval decrease in size of mediastinal and left axillary adenopathy. 3. Subcentimeter low-attenuation lesion within the liver, indeterminate. Hepatic metastatic disease not excluded. 4.  Emphysema  (ICD10-J43.9). Electronically Signed   By: Lovey Newcomer M.D.   On: 01/20/2018 13:00     ASSESSMENT/PLAN:  Adenocarcinoma of lung, stage 4 (HCC) This is a very pleasant 52 year old white female with metastatic non-small cell lung cancer, adenocarcinoma with no actionable mutations.  PDL 1 expression is 50% but the patient has significant lupus.  She is status post whole brain irradiation.  She is currently on treatment with systemic chemotherapy with carboplatin, Alimta and Avastin status post 4 cycles. The patient has been tolerating this treatment well except for fatigue and nausea. The patient felt worse with cycle 4 of her treatment.  She was given the option of delaying the start of cycle 5 x 1 week versus proceeding as planned today.  The patient would like to proceed.  She will proceed with cycle 5 of her treatment as scheduled today.  The patient will follow-up for weekly labs and a visit in 3 weeks for evaluation prior to cycle #6 of her treatment.  For nausea, I have refilled her Zofran.  For insomnia, I have refilled her temazepam.  Her headaches seem better with dexamethasone 1 mg daily.  She will continue this under the direction of Dr. Mickeal Skinner.  She is followed closely by radiation oncology and neuro-oncology for her brain metastases.  The patient was advised to call immediately if she has any concerning symptoms in  the interval. The patient voices understanding of current disease status and treatment options and is in agreement with the current care plan.   No orders of the defined types were placed in this encounter.  Mikey Bussing, DNP, AGPCNP-BC, AOCNP 02/14/18

## 2018-02-14 ENCOUNTER — Inpatient Hospital Stay: Payer: 59

## 2018-02-14 ENCOUNTER — Other Ambulatory Visit: Payer: Self-pay | Admitting: *Deleted

## 2018-02-14 ENCOUNTER — Telehealth: Payer: Self-pay | Admitting: Internal Medicine

## 2018-02-14 ENCOUNTER — Inpatient Hospital Stay: Payer: 59 | Admitting: Nutrition

## 2018-02-14 ENCOUNTER — Inpatient Hospital Stay (HOSPITAL_BASED_OUTPATIENT_CLINIC_OR_DEPARTMENT_OTHER): Payer: 59 | Admitting: Oncology

## 2018-02-14 ENCOUNTER — Encounter: Payer: Self-pay | Admitting: Oncology

## 2018-02-14 ENCOUNTER — Ambulatory Visit: Payer: 59

## 2018-02-14 VITALS — BP 107/82 | HR 97 | Temp 97.6°F | Resp 18 | Ht 62.0 in | Wt 111.4 lb

## 2018-02-14 DIAGNOSIS — G4700 Insomnia, unspecified: Secondary | ICD-10-CM

## 2018-02-14 DIAGNOSIS — C7931 Secondary malignant neoplasm of brain: Secondary | ICD-10-CM

## 2018-02-14 DIAGNOSIS — C3490 Malignant neoplasm of unspecified part of unspecified bronchus or lung: Secondary | ICD-10-CM

## 2018-02-14 DIAGNOSIS — Z95828 Presence of other vascular implants and grafts: Secondary | ICD-10-CM

## 2018-02-14 DIAGNOSIS — Z79899 Other long term (current) drug therapy: Secondary | ICD-10-CM | POA: Diagnosis not present

## 2018-02-14 DIAGNOSIS — Z5111 Encounter for antineoplastic chemotherapy: Secondary | ICD-10-CM | POA: Diagnosis not present

## 2018-02-14 DIAGNOSIS — C7970 Secondary malignant neoplasm of unspecified adrenal gland: Secondary | ICD-10-CM

## 2018-02-14 DIAGNOSIS — C342 Malignant neoplasm of middle lobe, bronchus or lung: Secondary | ICD-10-CM | POA: Diagnosis not present

## 2018-02-14 DIAGNOSIS — C7951 Secondary malignant neoplasm of bone: Secondary | ICD-10-CM | POA: Diagnosis not present

## 2018-02-14 DIAGNOSIS — R51 Headache: Secondary | ICD-10-CM | POA: Diagnosis not present

## 2018-02-14 DIAGNOSIS — Z923 Personal history of irradiation: Secondary | ICD-10-CM | POA: Diagnosis not present

## 2018-02-14 DIAGNOSIS — R110 Nausea: Secondary | ICD-10-CM | POA: Diagnosis not present

## 2018-02-14 LAB — CMP (CANCER CENTER ONLY)
ALBUMIN: 3.3 g/dL — AB (ref 3.5–5.0)
ALK PHOS: 195 U/L — AB (ref 40–150)
ALT: 43 U/L (ref 0–55)
AST: 47 U/L — AB (ref 5–34)
Anion gap: 9 (ref 3–11)
BUN: 7 mg/dL (ref 7–26)
CALCIUM: 9.5 mg/dL (ref 8.4–10.4)
CHLORIDE: 105 mmol/L (ref 98–109)
CO2: 24 mmol/L (ref 22–29)
CREATININE: 0.63 mg/dL (ref 0.60–1.10)
GFR, Est AFR Am: >60 mL/min (ref >60)
GFR, Estimated: >60 mL/min (ref >60)
GLUCOSE: 99 mg/dL (ref 70–140)
Potassium: 3.6 mmol/L (ref 3.5–5.1)
Sodium: 138 mmol/L (ref 136–145)
Total Bilirubin: 0.2 mg/dL (ref 0.2–1.2)
Total Protein: 7 g/dL (ref 6.4–8.3)

## 2018-02-14 LAB — TOTAL PROTEIN, URINE DIPSTICK: Protein, ur: NEGATIVE mg/dL

## 2018-02-14 LAB — CBC WITH DIFFERENTIAL (CANCER CENTER ONLY)
Basophils Absolute: 0 10*3/uL (ref 0.0–0.1)
Basophils Relative: 1 %
EOS ABS: 0.1 10*3/uL (ref 0.0–0.5)
Eosinophils Relative: 1 %
HEMATOCRIT: 34.2 % — AB (ref 34.8–46.6)
Hemoglobin: 11.5 g/dL — ABNORMAL LOW (ref 11.6–15.9)
LYMPHS ABS: 0.5 10*3/uL — AB (ref 0.9–3.3)
Lymphocytes Relative: 13 %
MCH: 35.8 pg — AB (ref 25.1–34.0)
MCHC: 33.7 g/dL (ref 31.5–36.0)
MCV: 106.2 fL — ABNORMAL HIGH (ref 79.5–101.0)
MONO ABS: 0.7 10*3/uL (ref 0.1–0.9)
MONOS PCT: 18 %
NEUTROS ABS: 2.4 10*3/uL (ref 1.5–6.5)
Neutrophils Relative %: 67 %
Platelet Count: 207 10*3/uL (ref 145–400)
RBC: 3.22 MIL/uL — ABNORMAL LOW (ref 3.70–5.45)
RDW: 18.8 % — AB (ref 11.2–14.5)
WBC Count: 3.6 10*3/uL — ABNORMAL LOW (ref 3.9–10.3)

## 2018-02-14 MED ORDER — SODIUM CHLORIDE 0.9 % IV SOLN
484.5000 mg | Freq: Once | INTRAVENOUS | Status: AC
  Administered 2018-02-14: 480 mg via INTRAVENOUS
  Filled 2018-02-14: qty 48

## 2018-02-14 MED ORDER — SODIUM CHLORIDE 0.9 % IV SOLN
510.0000 mg/m2 | Freq: Once | INTRAVENOUS | Status: AC
  Administered 2018-02-14: 800 mg via INTRAVENOUS
  Filled 2018-02-14: qty 20

## 2018-02-14 MED ORDER — SODIUM CHLORIDE 0.9% FLUSH
10.0000 mL | INTRAVENOUS | Status: DC | PRN
  Administered 2018-02-14: 10 mL
  Filled 2018-02-14: qty 10

## 2018-02-14 MED ORDER — PALONOSETRON HCL INJECTION 0.25 MG/5ML
INTRAVENOUS | Status: AC
  Filled 2018-02-14: qty 5

## 2018-02-14 MED ORDER — TEMAZEPAM 30 MG PO CAPS: ORAL_CAPSULE | ORAL | 0 refills | Status: AC

## 2018-02-14 MED ORDER — FOSAPREPITANT DIMEGLUMINE INJECTION 150 MG
Freq: Once | INTRAVENOUS | Status: AC
  Administered 2018-02-14: 11:00:00 via INTRAVENOUS
  Filled 2018-02-14: qty 5

## 2018-02-14 MED ORDER — PALONOSETRON HCL INJECTION 0.25 MG/5ML
0.2500 mg | Freq: Once | INTRAVENOUS | Status: AC
  Administered 2018-02-14: 0.25 mg via INTRAVENOUS

## 2018-02-14 MED ORDER — ONDANSETRON 4 MG PO TBDP: 4.0000 mg | ORAL_TABLET | Freq: Three times a day (TID) | ORAL | 1 refills | Status: AC | PRN

## 2018-02-14 MED ORDER — BENZONATATE 200 MG PO CAPS: 200.0000 mg | ORAL_CAPSULE | Freq: Three times a day (TID) | ORAL | 0 refills | Status: DC | PRN

## 2018-02-14 MED ORDER — SODIUM CHLORIDE 0.9 % IV SOLN
800.0000 mg | Freq: Once | INTRAVENOUS | Status: AC
  Administered 2018-02-14: 800 mg via INTRAVENOUS
  Filled 2018-02-14: qty 32

## 2018-02-14 MED ORDER — SODIUM CHLORIDE 0.9 % IV SOLN
Freq: Once | INTRAVENOUS | Status: AC
  Administered 2018-02-14: 11:00:00 via INTRAVENOUS

## 2018-02-14 NOTE — Patient Instructions (Signed)
[facility] Discharge Instructions for Patients Receiving Chemotherapy  Today you received the following chemotherapy agents Avastin, Alimta, Carboplatin  To help prevent nausea and vomiting after your treatment, we encourage you to take your nausea medication as directed   If you develop nausea and vomiting that is not controlled by your nausea medication, call the clinic.   BELOW ARE SYMPTOMS THAT SHOULD BE REPORTED IMMEDIATELY:  *FEVER GREATER THAN 100.5 F  *CHILLS WITH OR WITHOUT FEVER  NAUSEA AND VOMITING THAT IS NOT CONTROLLED WITH YOUR NAUSEA MEDICATION  *UNUSUAL SHORTNESS OF BREATH  *UNUSUAL BRUISING OR BLEEDING  TENDERNESS IN MOUTH AND THROAT WITH OR WITHOUT PRESENCE OF ULCERS  *URINARY PROBLEMS  *BOWEL PROBLEMS  UNUSUAL RASH Items with * indicate a potential emergency and should be followed up as soon as possible.  Feel free to call the clinic should you have any questions or concerns. The clinic phone number is [phone].  Please show the [place] at check-in to the Emergency Department and triage nurse.

## 2018-02-14 NOTE — Progress Notes (Signed)
Nutrition follow-up completed with patient being treated for non-small cell lung cancer. Weight stable at 111.4 pounds on June 24. Patient continues to complain of poor appetite and nausea. Reports Zofran has helped but she still has nausea. States she just cannot eat. She can consume Premier protein and drinks 1 daily.  She continues to enjoy a raw vegetables and salads.  Nutrition diagnosis: Unintended weight loss is stable.  Intervention: Provided support and encouragement for patient to try to increase calories and protein. Provided several suggestions for extra calories and protein. Teach back method used.  Monitoring, evaluation, goals: Patient will tolerate increased calories and protein to minimize weight loss.  Next visit: Monday, July 15 during infusion.  **Disclaimer: This note was dictated with voice recognition software. Similar sounding words can inadvertently be transcribed and this note may contain transcription errors which may not have been corrected upon publication of note.**

## 2018-02-14 NOTE — Telephone Encounter (Signed)
Added additional weekly lab/flush and q3w lab/flush/fu/tx beginning 7/29. Spoke with patient she is aware and will get updated schedule at next visit 7/1. Last date in care plan is 8/5.

## 2018-02-16 ENCOUNTER — Ambulatory Visit: Payer: Self-pay | Admitting: Urology

## 2018-02-21 ENCOUNTER — Inpatient Hospital Stay: Payer: 59

## 2018-02-21 ENCOUNTER — Telehealth: Payer: Self-pay | Admitting: Emergency Medicine

## 2018-02-21 ENCOUNTER — Inpatient Hospital Stay: Payer: 59 | Attending: Oncology

## 2018-02-21 DIAGNOSIS — Z853 Personal history of malignant neoplasm of breast: Secondary | ICD-10-CM | POA: Insufficient documentation

## 2018-02-21 DIAGNOSIS — C7931 Secondary malignant neoplasm of brain: Secondary | ICD-10-CM | POA: Insufficient documentation

## 2018-02-21 DIAGNOSIS — M329 Systemic lupus erythematosus, unspecified: Secondary | ICD-10-CM | POA: Diagnosis not present

## 2018-02-21 DIAGNOSIS — C342 Malignant neoplasm of middle lobe, bronchus or lung: Secondary | ICD-10-CM | POA: Diagnosis not present

## 2018-02-21 DIAGNOSIS — M069 Rheumatoid arthritis, unspecified: Secondary | ICD-10-CM | POA: Insufficient documentation

## 2018-02-21 DIAGNOSIS — F419 Anxiety disorder, unspecified: Secondary | ICD-10-CM | POA: Insufficient documentation

## 2018-02-21 DIAGNOSIS — Z9223 Personal history of estrogen therapy: Secondary | ICD-10-CM | POA: Insufficient documentation

## 2018-02-21 DIAGNOSIS — Z923 Personal history of irradiation: Secondary | ICD-10-CM | POA: Insufficient documentation

## 2018-02-21 DIAGNOSIS — C7970 Secondary malignant neoplasm of unspecified adrenal gland: Secondary | ICD-10-CM | POA: Diagnosis not present

## 2018-02-21 DIAGNOSIS — C3490 Malignant neoplasm of unspecified part of unspecified bronchus or lung: Secondary | ICD-10-CM

## 2018-02-21 DIAGNOSIS — I7300 Raynaud's syndrome without gangrene: Secondary | ICD-10-CM | POA: Insufficient documentation

## 2018-02-21 DIAGNOSIS — C7951 Secondary malignant neoplasm of bone: Secondary | ICD-10-CM | POA: Diagnosis not present

## 2018-02-21 DIAGNOSIS — R51 Headache: Secondary | ICD-10-CM | POA: Insufficient documentation

## 2018-02-21 DIAGNOSIS — Z5111 Encounter for antineoplastic chemotherapy: Secondary | ICD-10-CM | POA: Diagnosis present

## 2018-02-21 DIAGNOSIS — F329 Major depressive disorder, single episode, unspecified: Secondary | ICD-10-CM | POA: Insufficient documentation

## 2018-02-21 DIAGNOSIS — Z79899 Other long term (current) drug therapy: Secondary | ICD-10-CM | POA: Diagnosis not present

## 2018-02-21 DIAGNOSIS — M797 Fibromyalgia: Secondary | ICD-10-CM | POA: Insufficient documentation

## 2018-02-21 DIAGNOSIS — Z9013 Acquired absence of bilateral breasts and nipples: Secondary | ICD-10-CM | POA: Diagnosis not present

## 2018-02-21 DIAGNOSIS — R112 Nausea with vomiting, unspecified: Secondary | ICD-10-CM | POA: Insufficient documentation

## 2018-02-21 DIAGNOSIS — Z95828 Presence of other vascular implants and grafts: Secondary | ICD-10-CM

## 2018-02-21 LAB — CMP (CANCER CENTER ONLY)
ALBUMIN: 3.4 g/dL — AB (ref 3.5–5.0)
ALT: 40 U/L (ref 0–44)
AST: 48 U/L — AB (ref 15–41)
Alkaline Phosphatase: 166 U/L — ABNORMAL HIGH (ref 38–126)
Anion gap: 7 (ref 5–15)
BUN: 12 mg/dL (ref 6–20)
CALCIUM: 9.6 mg/dL (ref 8.9–10.3)
CHLORIDE: 104 mmol/L (ref 98–111)
CO2: 27 mmol/L (ref 22–32)
CREATININE: 0.64 mg/dL (ref 0.44–1.00)
GFR, Est AFR Am: >60 mL/min (ref >60)
GFR, Estimated: >60 mL/min (ref >60)
GLUCOSE: 97 mg/dL (ref 70–99)
POTASSIUM: 3.9 mmol/L (ref 3.5–5.1)
Sodium: 138 mmol/L (ref 135–145)
Total Bilirubin: 0.4 mg/dL (ref 0.3–1.2)
Total Protein: 6.8 g/dL (ref 6.5–8.1)

## 2018-02-21 LAB — CBC WITH DIFFERENTIAL (CANCER CENTER ONLY)
Basophils Absolute: 0 10*3/uL (ref 0.0–0.1)
Basophils Relative: 1 %
EOS ABS: 0 10*3/uL (ref 0.0–0.5)
Eosinophils Relative: 1 %
HCT: 31.7 % — ABNORMAL LOW (ref 34.8–46.6)
Hemoglobin: 10.8 g/dL — ABNORMAL LOW (ref 11.6–15.9)
LYMPHS ABS: 0.3 10*3/uL — AB (ref 0.9–3.3)
Lymphocytes Relative: 13 %
MCH: 35.8 pg — AB (ref 25.1–34.0)
MCHC: 34 g/dL (ref 31.5–36.0)
MCV: 105.4 fL — ABNORMAL HIGH (ref 79.5–101.0)
MONO ABS: 0.3 10*3/uL (ref 0.1–0.9)
MONOS PCT: 16 %
Neutro Abs: 1.4 10*3/uL — ABNORMAL LOW (ref 1.5–6.5)
Neutrophils Relative %: 69 %
Platelet Count: 177 10*3/uL (ref 145–400)
RBC: 3.01 MIL/uL — ABNORMAL LOW (ref 3.70–5.45)
RDW: 17.3 % — AB (ref 11.2–14.5)
WBC Count: 2.1 10*3/uL — ABNORMAL LOW (ref 3.9–10.3)

## 2018-02-21 MED ORDER — SODIUM CHLORIDE 0.9% FLUSH
10.0000 mL | INTRAVENOUS | Status: DC | PRN
  Administered 2018-02-21: 10 mL
  Filled 2018-02-21: qty 10

## 2018-02-21 MED ORDER — HEPARIN SOD (PORK) LOCK FLUSH 100 UNIT/ML IV SOLN
500.0000 [IU] | Freq: Once | INTRAVENOUS | Status: AC | PRN
  Administered 2018-02-21: 500 [IU]
  Filled 2018-02-21: qty 5

## 2018-02-21 NOTE — Telephone Encounter (Signed)
Pt left cancer center before she could be seen by [facility] RN.  Called pt and spoke to her and husband about constipation.  Per MD Julien Nordmann pt is to try miralax and mag citrate next then call back if the problem continues.  Verbalized understanding.

## 2018-02-28 ENCOUNTER — Inpatient Hospital Stay (HOSPITAL_BASED_OUTPATIENT_CLINIC_OR_DEPARTMENT_OTHER): Payer: 59

## 2018-02-28 ENCOUNTER — Telehealth: Payer: Self-pay | Admitting: Medical Oncology

## 2018-02-28 ENCOUNTER — Inpatient Hospital Stay: Payer: 59

## 2018-02-28 ENCOUNTER — Other Ambulatory Visit: Payer: Self-pay | Admitting: Medical Oncology

## 2018-02-28 DIAGNOSIS — C342 Malignant neoplasm of middle lobe, bronchus or lung: Secondary | ICD-10-CM | POA: Diagnosis not present

## 2018-02-28 DIAGNOSIS — Z5111 Encounter for antineoplastic chemotherapy: Secondary | ICD-10-CM

## 2018-02-28 DIAGNOSIS — C3490 Malignant neoplasm of unspecified part of unspecified bronchus or lung: Secondary | ICD-10-CM

## 2018-02-28 DIAGNOSIS — C7931 Secondary malignant neoplasm of brain: Secondary | ICD-10-CM

## 2018-02-28 DIAGNOSIS — Z95828 Presence of other vascular implants and grafts: Secondary | ICD-10-CM

## 2018-02-28 LAB — CBC WITH DIFFERENTIAL (CANCER CENTER ONLY)
Basophils Absolute: 0 10*3/uL (ref 0.0–0.1)
Basophils Relative: 0 %
EOS ABS: 0 10*3/uL (ref 0.0–0.5)
Eosinophils Relative: 1 %
HEMATOCRIT: 32.7 % — AB (ref 34.8–46.6)
HEMOGLOBIN: 11.1 g/dL — AB (ref 11.6–15.9)
Lymphocytes Relative: 10 %
Lymphs Abs: 0.3 10*3/uL — ABNORMAL LOW (ref 0.9–3.3)
MCH: 36.2 pg — AB (ref 25.1–34.0)
MCHC: 33.9 g/dL (ref 31.5–36.0)
MCV: 106.8 fL — ABNORMAL HIGH (ref 79.5–101.0)
Monocytes Absolute: 0.6 10*3/uL (ref 0.1–0.9)
Monocytes Relative: 21 %
NEUTROS PCT: 68 %
Neutro Abs: 2 10*3/uL (ref 1.5–6.5)
Platelet Count: 109 10*3/uL — ABNORMAL LOW (ref 145–400)
RBC: 3.06 MIL/uL — ABNORMAL LOW (ref 3.70–5.45)
RDW: 18.1 % — ABNORMAL HIGH (ref 11.2–14.5)
WBC Count: 3 10*3/uL — ABNORMAL LOW (ref 3.9–10.3)

## 2018-02-28 LAB — CMP (CANCER CENTER ONLY)
ALBUMIN: 3.3 g/dL — AB (ref 3.5–5.0)
ALT: 33 U/L (ref 0–44)
AST: 24 U/L (ref 15–41)
Alkaline Phosphatase: 144 U/L — ABNORMAL HIGH (ref 38–126)
Anion gap: 8 (ref 5–15)
BUN: 9 mg/dL (ref 6–20)
CO2: 25 mmol/L (ref 22–32)
CREATININE: 0.67 mg/dL (ref 0.44–1.00)
Calcium: 9.3 mg/dL (ref 8.9–10.3)
Chloride: 104 mmol/L (ref 98–111)
GFR, Estimated: >60 mL/min (ref >60)
Glucose, Bld: 99 mg/dL (ref 70–99)
Potassium: 3.6 mmol/L (ref 3.5–5.1)
Sodium: 137 mmol/L (ref 135–145)
Total Bilirubin: 0.3 mg/dL (ref 0.3–1.2)
Total Protein: 7 g/dL (ref 6.5–8.1)

## 2018-02-28 MED ORDER — LEVETIRACETAM 500 MG PO TABS: 1500.0000 mg | ORAL_TABLET | Freq: Two times a day (BID) | ORAL | 1 refills | Status: DC

## 2018-02-28 MED ORDER — LEVETIRACETAM 750 MG PO TABS: 1500.0000 mg | ORAL_TABLET | Freq: Two times a day (BID) | ORAL | 1 refills | Status: AC

## 2018-02-28 MED ORDER — BENZONATATE 200 MG PO CAPS: 200.0000 mg | ORAL_CAPSULE | Freq: Three times a day (TID) | ORAL | 0 refills | Status: DC | PRN

## 2018-02-28 MED ORDER — HEPARIN SOD (PORK) LOCK FLUSH 100 UNIT/ML IV SOLN
500.0000 [IU] | Freq: Once | INTRAVENOUS | Status: AC | PRN
  Administered 2018-02-28: 500 [IU]
  Filled 2018-02-28: qty 5

## 2018-02-28 MED ORDER — SODIUM CHLORIDE 0.9% FLUSH
10.0000 mL | INTRAVENOUS | Status: DC | PRN
  Administered 2018-02-28: 10 mL
  Filled 2018-02-28: qty 10

## 2018-02-28 MED ORDER — PROCHLORPERAZINE MALEATE 10 MG PO TABS: 10.0000 mg | ORAL_TABLET | Freq: Four times a day (QID) | ORAL | 1 refills | Status: AC | PRN

## 2018-02-28 NOTE — Telephone Encounter (Signed)
Husband states Diana Schwartz ready to quit chemo-she has no QOL. She stays tired. Headaches , last  seizure 2 weeks ago, vomited 2 days ago. Labs reviewed with [facility] and Diana Schwartz . Seizures reported to Dr Mickeal Skinner who  increased her [place]. Per Julien Nordmann I instructed Diana Schwartz to stop KDUR and increase K+ in diet.

## 2018-02-28 NOTE — Addendum Note (Signed)
Addended by: Ardeen Garland on: 02/28/2018 04:16 PM   Modules accepted: Orders

## 2018-02-28 NOTE — Addendum Note (Signed)
Addended by: Ardeen Garland on: 02/28/2018 01:19 PM   Modules accepted: Orders

## 2018-02-28 NOTE — Progress Notes (Signed)
Completed 02/28/18

## 2018-03-03 NOTE — Assessment & Plan Note (Addendum)
This is a very pleasant 52 year old white female with metastatic non-small cell lung cancer, adenocarcinoma with no actionable mutations. PDL 1 expression is 50% but the patient has significant lupus. She is status post whole brain irradiation. She is currently on treatment with systemic chemotherapy with carboplatin, Alimta and Avastin status post 5cycles. The patient has developed increasing fatigue, nausea, and vomiting.  The patient is contemplating taking a break from chemotherapy due to side effects. The patient is here for evaluation prior to starting cycle #6 of her treatment today.  The patient was seen with Dr. Julien Nordmann.  We discussed with the patient that she is having increased fatigue, nausea and vomiting, and more weight loss.  Recommend holding off on her treatment today that was scheduled.  Recommend that she proceed with a restaging CT scan of the chest, abdomen, pelvis and then we can discuss with the patient and her family if she would like to proceed with maintenance Alimta and Avastin reversing stopping treatment altogether.  The patient is in agreement with this plan.  The patient will be going to the beach in the next few weeks.  We will obtain her CT scan when she returns.  The patient will follow-up in approximately 3 weeks to review the restaging CT scan results and discuss her treatment options.  For weight loss, she will continue on nutritional supplements.  She is followed by our dietitian.  The patient's nausea is not completely controlled with Compazine and Zofran.  The patient was given a prescription for Zyprexa 10 mg at bedtime.    Her headaches seem better with dexamethasone 1 mg daily.  She will continue this under the direction of Dr. Mickeal Skinner.  She is followed closely by radiation oncology and neuro-oncology for her brain metastases.  For her seizures, she will continue Keppra 1500 mg twice a day.  She will follow with Dr. Mickeal Skinner for management of her  seizures.  The patient was advised to call immediately if she has any concerning symptoms in the interval. The patient voices understanding of current disease status and treatment options and is in agreement with the current care plan.

## 2018-03-03 NOTE — Progress Notes (Signed)
[facility] OFFICE PROGRESS NOTE  [name], MD [place] [address]  DIAGNOSIS: Stage IV non-small cell lung cancer, adenocarcinoma presenting with a left middle lobe mass that abuts the mediastinal border/pericardium and the pleural surface. The patient was found to have adrenal and bone metastasis andover 20 brain metastases.  PD-L1 50%  PRIOR THERAPY: Whole brain radiation and palliative radiation to the left lung on 11/10/2017.  CURRENT THERAPY: Systemic chemotherapy with carboplatin for an AUC of 5, Alimta 500 mg/m2, and Avastin 15 mg/kg every 3 weeks. First dose started on 11/22/2017. Status post 5cycles.  INTERVAL HISTORY: Diana Schwartz 52 y.o. female returns for routine follow-up visit accompanied by her husband, daughter, and son-in-law.  The patient reports that she is having increasing fatigue, ongoing nausea and vomiting, and pain to her bilateral hips and lower back.  Her left hip has more pain than the right.  She has been taking hydrocodone on an intermittent basis.  The patient had 2 seizures several weeks ago and her Keppra dose was increased.  She has not had any recurrent seizures since the increase in dose.  She remains on dexamethasone 1 mg daily.  She denies fevers and chills.  Denies chest pain, shortness of breath, hemoptysis.  She does report a cough.  The patient takes Zofran and Compazine for her nausea and vomiting.  Zofran seems to help more than the Compazine does but she still has ongoing nausea and vomiting.  Denies constipation and diarrhea.  Denies headaches and visual changes.  She has a poor appetite and has lost more weight since her last visit.  The patient states that she is having increased fatigue and does not want treatment today.  The patient is here for evaluation prior to cycle #6 of her treatment today.  MEDICAL HISTORY: Past Medical History:  Diagnosis Date  . Anxiety   . Autoimmune hepatitis  ([place])    no current med.  . Breast cancer [facility]) 2013   Left breast cancer. Treated at [facility]. Per notes, nvasive lobular carcinoma. S/P bilateral mastectomy with recontruction and Tamoxifen X5 years.   . Carpal tunnel syndrome of left wrist   . Degenerative disc disease, lumbar   . Depression   . Fibromyalgia   . Lupus (systemic lupus erythematosus) ([place])   . Osteoarthritis   . Rash    arms, chest - due to lupus  . Raynaud disease   . Rheumatoid arthritis ([place])   . Sjoegren syndrome     ALLERGIES:  is allergic to oxycodone; doxycycline; and prednisone.  MEDICATIONS:  Current Outpatient Medications  Medication Sig Dispense Refill  . acetaminophen (TYLENOL) 500 MG tablet Take 1,000 mg by mouth every 6 (six) hours as needed.    Marland Kitchen azaTHIOprine (IMURAN) 50 MG tablet Take 50 mg by mouth 2 (two) times daily.    . benzonatate (TESSALON) 200 MG capsule Take 1 capsule (200 mg total) by mouth 3 (three) times daily as needed for cough. 30 capsule 0  . cetirizine (ZYRTEC) 10 MG tablet Take 10 mg by mouth daily.    . Cholecalciferol (HM VITAMIN D3) 4000 units CAPS Take by mouth.    . dexamethasone (DECADRON) 2 MG tablet Take 1 tablet (2 mg total) by mouth 2 (two) times daily. (Patient taking differently: Take 1 mg by mouth 2 (two) times daily. ) 60 tablet 0  . folic acid (FOLVITE) 1 MG tablet Take 1 tablet (1 mg total) by mouth daily. [place]  tablet 2  . HYDROcodone-acetaminophen (NORCO/VICODIN) 5-325 MG tablet     . hydroxychloroquine (PLAQUENIL) 200 MG tablet Take 200 mg by mouth 2 (two) times daily.    Marland Kitchen levETIRAcetam (KEPPRA) 750 MG tablet Take 2 tablets (1,500 mg total) by mouth 2 (two) times daily. 120 tablet 1  . lidocaine-prilocaine (EMLA) cream Apply 1 application topically as needed. 30 g 0  . omeprazole (PRILOSEC) 40 MG capsule Take by mouth daily.  3  . ondansetron (ZOFRAN ODT) 4 MG disintegrating tablet Take 1 tablet (4 mg total) by mouth every 8 (eight) hours as needed for nausea or  vomiting. 20 tablet 1  . prochlorperazine (COMPAZINE) 10 MG tablet Take 1 tablet (10 mg total) by mouth every 6 (six) hours as needed for nausea or vomiting. 30 tablet 1  . senna-docusate (SENNA S) 8.6-50 MG tablet Take 1 tablet by mouth daily.    . temazepam (RESTORIL) 30 MG capsule TAKE 1 CAPSULE BY MOUTH AT BEDTIME AS NEEDED FOR SLEEP. DO NOT TAKE WITH XANAX (ALPRAZOLAM) 30 capsule 0  . venlafaxine XR (EFFEXOR-XR) 150 MG 24 hr capsule Take 2 capsules by mouth daily with breakfast.     . dexamethasone (DECADRON) 4 MG tablet Take 1 tablet twice a day the day before, day of, and day after each cycle of chemotherapy. (Patient not taking: Reported on 01/31/2018) 30 tablet 1  . OLANZapine (ZYPREXA) 10 MG tablet Take 1 tablet (10 mg total) by mouth at bedtime. 30 tablet 1   No current facility-administered medications for this visit.     SURGICAL HISTORY:  Past Surgical History:  Procedure Laterality Date  . ANTERIOR CERVICAL DECOMP/DISCECTOMY FUSION  02/06/2004   C5-6, C6-7  . AXILLARY NODE DISSECTION Left 08/2012  . BREAST RECONSTRUCTION  06/2013  . CARPAL TUNNEL RELEASE Right 1989  . CARPAL TUNNEL RELEASE Left 10/02/2013   Procedure: LEFT CARPAL TUNNEL RELEASE;  Surgeon: Linna Hoff, MD;  Location: [facility];  Service: Orthopedics;  Laterality: Left;  Local Anesthesia with IV Sedation  . ENDOBRONCHIAL ULTRASOUND Bilateral 10/27/2017   Procedure: ENDOBRONCHIAL ULTRASOUND;  Surgeon: Marshell Garfinkel, MD;  Location: [facility];  Service: Cardiopulmonary;  Laterality: Bilateral;  . FOOT SURGERY Right    bunion and broke toe and then realign toe  . IR FLUORO GUIDE PORT INSERTION RIGHT  10/25/2017  . IR US GUIDE VASC ACCESS RIGHT  10/25/2017  . MASTECTOMY Bilateral 08/12/2012   LEFT BREAST CANCER  . thoracic back surgery  05/2015  . VAGINAL HYSTERECTOMY  1999    REVIEW OF SYSTEMS:   Review of Systems  Constitutional: Negative for fever and chills.  Positive for fatigue,  decreased appetite, and weight loss.  HENT:   Negative for mouth sores, nosebleeds, sore throat and trouble swallowing.   Eyes: Negative for eye problems and icterus.  Respiratory: Negative for hemoptysis, shortness of breath and wheezing.  Positive for cough. Cardiovascular: Negative for chest pain and leg swelling.  Gastrointestinal: Negative for abdominal pain, constipation, diarrhea.  Positive for nausea and vomiting.  Genitourinary: Negative for bladder incontinence, difficulty urinating, dysuria, frequency and hematuria.   Musculoskeletal: Negative for neck pain and neck stiffness. Positive for back pain and hip pain. Skin: Negative for itching and rash.  Neurological: Negative for dizziness, extremity weakness, gait problem, headaches, light-headedness.  She had 2 seizures about 2 weeks ago.  Keppra dose has been increased and no recurrent seizures since the increase in dose.  Hematological: Negative for adenopathy. Does not bruise/bleed easily.  Psychiatric/Behavioral: Negative for confusion, depression and sleep disturbance. The patient is not nervous/anxious.     PHYSICAL EXAMINATION:  Blood pressure 95/73, pulse 99, temperature 97.6 F (36.4 C), temperature source Oral, resp. rate 17, height _0  (1.575 m), weight 105 lb 8 oz (47.9 kg), SpO2 100 %.  ECOG PERFORMANCE STATUS: 1 - Symptomatic but completely ambulatory  Physical Exam  Constitutional: Oriented to person, place, and time. No distress.  HENT:  Head: Normocephalic and atraumatic.  Mouth/Throat: Oropharynx is clear and moist. No oropharyngeal exudate.  Eyes: Conjunctivae are normal. Right eye exhibits no discharge. Left eye exhibits no discharge. No scleral icterus.  Neck: Normal range of motion. Neck supple.  Cardiovascular: Normal rate, regular rhythm, normal heart sounds and intact distal pulses.   Pulmonary/Chest: Effort normal and breath sounds normal. No respiratory distress. No wheezes. No rales.  Abdominal:  Soft. Bowel sounds are normal. Exhibits no distension and no mass. There is no tenderness.  Musculoskeletal: Normal range of motion. Exhibits no edema.  Lymphadenopathy:    No cervical adenopathy.  Neurological: Alert and oriented to person, place, and time. Exhibits normal muscle tone. Coordination normal.  Skin: Skin is warm and dry. No rash noted. Not diaphoretic. No erythema. No pallor.  Psychiatric: Mood, memory and judgment normal.  Vitals reviewed.  LABORATORY DATA: Lab Results  Component Value Date   WBC 4.2 03/07/2018   HGB 11.5 (L) 03/07/2018   HCT 33.9 (L) 03/07/2018   MCV 105.9 (H) 03/07/2018   PLT 160 03/07/2018      Chemistry      Component Value Date/Time   NA 136 03/07/2018 0915   K 3.6 03/07/2018 0915   CL 102 03/07/2018 0915   CO2 26 03/07/2018 0915   BUN 9 03/07/2018 0915   CREATININE 0.67 03/07/2018 0915      Component Value Date/Time   CALCIUM 9.3 03/07/2018 0915   ALKPHOS 171 (H) 03/07/2018 0915   AST 19 03/07/2018 0915   ALT 21 03/07/2018 0915   BILITOT 0.3 03/07/2018 0915       RADIOGRAPHIC STUDIES:  No results found.   ASSESSMENT/PLAN:  Adenocarcinoma of lung, stage 4 (HCC) This is a very pleasant 52 year old white female with metastatic non-small cell lung cancer, adenocarcinoma with no actionable mutations. PDL 1 expression is 50% but the patient has significant lupus. She is status post whole brain irradiation. She is currently on treatment with systemic chemotherapy with carboplatin, Alimta and Avastin status post 5cycles. The patient has developed increasing fatigue, nausea, and vomiting.  The patient is contemplating taking a break from chemotherapy due to side effects. The patient is here for evaluation prior to starting cycle #6 of her treatment today.  The patient was seen with Dr. Julien Nordmann.  We discussed with the patient that she is having increased fatigue, nausea and vomiting, and more weight loss.  Recommend holding off on her  treatment today that was scheduled.  Recommend that she proceed with a restaging CT scan of the chest, abdomen, pelvis and then we can discuss with the patient and her family if she would like to proceed with maintenance Alimta and Avastin reversing stopping treatment altogether.  The patient is in agreement with this plan.  The patient will be going to the beach in the next few weeks.  We will obtain her CT scan when she returns.  The patient will follow-up in approximately 3 weeks to review the restaging CT scan results and discuss her treatment options.  For  weight loss, she will continue on nutritional supplements.  She is followed by our dietitian.  The patient's nausea is not completely controlled with Compazine and Zofran.  The patient was given a prescription for Zyprexa 10 mg at bedtime.    Her headaches seem better with dexamethasone 1 mg daily.  She will continue this under the direction of Dr. Mickeal Skinner.  She is followed closely by radiation oncology and neuro-oncology for her brain metastases.  For her seizures, she will continue Keppra 1500 mg twice a day.  She will follow with Dr. Mickeal Skinner for management of her seizures.  The patient was advised to call immediately if she has any concerning symptoms in the interval. The patient voices understanding of current disease status and treatment options and is in agreement with the current care plan.   Orders Placed This Encounter  Procedures  . CT ABDOMEN PELVIS W CONTRAST    Standing Status:   Future    Standing Expiration Date:   03/08/2019    Order Specific Question:   If indicated for the ordered procedure, I authorize the administration of contrast media per Radiology protocol    Answer:   Yes    Order Specific Question:   Preferred imaging location?    Answer:   [facility]    Order Specific Question:   Radiology Contrast Protocol - do NOT remove file path    Answer:   \\[email or web address]    Order  Specific Question:   ** REASON FOR EXAM (FREE TEXT)    Answer:   Lung cancer. Restaginig.    Order Specific Question:   Is patient pregnant?    Answer:   No  . CT CHEST W CONTRAST    Standing Status:   Future    Standing Expiration Date:   03/08/2019    Order Specific Question:   If indicated for the ordered procedure, I authorize the administration of contrast media per Radiology protocol    Answer:   Yes    Order Specific Question:   Preferred imaging location?    Answer:   [facility]    Order Specific Question:   Radiology Contrast Protocol - do NOT remove file path    Answer:   \\[email or web address]    Order Specific Question:   ** REASON FOR EXAM (FREE TEXT)    Answer:   Lung cancer. Restaginig.    Order Specific Question:   Is patient pregnant?    Answer:   No   Mikey Bussing, DNP, AGPCNP-BC, AOCNP 03/07/18   ADDENDUM: Hematology/Oncology Attending: I had a face-to-face encounter with the patient today.  I recommended her care plan.  She came to the clinic today accompanied by her husband, daughter and son-in-law.  The patient has metastatic non-small cell lung cancer, adenocarcinoma and she is currently undergoing systemic chemotherapy with carboplatin, Alimta and Avastin status post 5 cycles.  She has a rough time with her systemic chemotherapy with increasing fatigue and weakness as well as persistent nausea.  She is currently on treatment with Zofran and Compazine with mild improvement of her nausea.  She is not interested in proceeding with cycle #6 today. I had a lengthy discussion with the patient and her family about her condition.  I recommended for her to hold treatment with cycle #6.  I will arrange for the patient to have repeat CT scan of the chest, abdomen and pelvis in 3 weeks for restaging of her disease before making a final decision regarding her  treatment or referral for hospice. For the persistent nausea, I started the patient on  Zyprexa 10 mg p.o. nightly.  She will continue to use Zofran and Compazine on the daytime if needed. The patient was also encouraged to increase her oral intake.  She would like few weeks of treatment to enjoy her time at the beach with the family. She was advised to call immediately if she has any concerning symptoms in the interval.  Disclaimer: This note was dictated with voice recognition software. Similar sounding words can inadvertently be transcribed and may be missed upon review. Eilleen Kempf, MD 03/07/18

## 2018-03-07 ENCOUNTER — Inpatient Hospital Stay: Payer: 59 | Admitting: Nutrition

## 2018-03-07 ENCOUNTER — Telehealth: Payer: Self-pay

## 2018-03-07 ENCOUNTER — Inpatient Hospital Stay: Payer: 59

## 2018-03-07 ENCOUNTER — Inpatient Hospital Stay (HOSPITAL_BASED_OUTPATIENT_CLINIC_OR_DEPARTMENT_OTHER): Payer: 59 | Admitting: Oncology

## 2018-03-07 ENCOUNTER — Encounter: Payer: Self-pay | Admitting: Oncology

## 2018-03-07 VITALS — BP 95/73 | HR 99 | Temp 97.6°F | Resp 17 | Ht 62.0 in | Wt 105.5 lb

## 2018-03-07 DIAGNOSIS — C342 Malignant neoplasm of middle lobe, bronchus or lung: Secondary | ICD-10-CM | POA: Diagnosis not present

## 2018-03-07 DIAGNOSIS — C3490 Malignant neoplasm of unspecified part of unspecified bronchus or lung: Secondary | ICD-10-CM

## 2018-03-07 DIAGNOSIS — Z79899 Other long term (current) drug therapy: Secondary | ICD-10-CM

## 2018-03-07 DIAGNOSIS — Z5111 Encounter for antineoplastic chemotherapy: Secondary | ICD-10-CM

## 2018-03-07 DIAGNOSIS — R112 Nausea with vomiting, unspecified: Secondary | ICD-10-CM

## 2018-03-07 DIAGNOSIS — C7970 Secondary malignant neoplasm of unspecified adrenal gland: Secondary | ICD-10-CM

## 2018-03-07 DIAGNOSIS — R51 Headache: Secondary | ICD-10-CM

## 2018-03-07 DIAGNOSIS — Z923 Personal history of irradiation: Secondary | ICD-10-CM

## 2018-03-07 DIAGNOSIS — Z9223 Personal history of estrogen therapy: Secondary | ICD-10-CM

## 2018-03-07 DIAGNOSIS — Z95828 Presence of other vascular implants and grafts: Secondary | ICD-10-CM

## 2018-03-07 DIAGNOSIS — C7951 Secondary malignant neoplasm of bone: Secondary | ICD-10-CM | POA: Diagnosis not present

## 2018-03-07 DIAGNOSIS — C7931 Secondary malignant neoplasm of brain: Secondary | ICD-10-CM | POA: Diagnosis not present

## 2018-03-07 DIAGNOSIS — Z853 Personal history of malignant neoplasm of breast: Secondary | ICD-10-CM

## 2018-03-07 DIAGNOSIS — E440 Moderate protein-calorie malnutrition: Secondary | ICD-10-CM

## 2018-03-07 DIAGNOSIS — Z9013 Acquired absence of bilateral breasts and nipples: Secondary | ICD-10-CM

## 2018-03-07 LAB — CBC WITH DIFFERENTIAL (CANCER CENTER ONLY)
BASOS PCT: 1 %
Basophils Absolute: 0 10*3/uL (ref 0.0–0.1)
EOS ABS: 0.1 10*3/uL (ref 0.0–0.5)
Eosinophils Relative: 3 %
HCT: 33.9 % — ABNORMAL LOW (ref 34.8–46.6)
HEMOGLOBIN: 11.5 g/dL — AB (ref 11.6–15.9)
Lymphocytes Relative: 14 %
Lymphs Abs: 0.6 10*3/uL — ABNORMAL LOW (ref 0.9–3.3)
MCH: 35.9 pg — ABNORMAL HIGH (ref 25.1–34.0)
MCHC: 33.9 g/dL (ref 31.5–36.0)
MCV: 105.9 fL — ABNORMAL HIGH (ref 79.5–101.0)
MONO ABS: 0.8 10*3/uL (ref 0.1–0.9)
MONOS PCT: 19 %
NEUTROS PCT: 63 %
Neutro Abs: 2.7 10*3/uL (ref 1.5–6.5)
Platelet Count: 160 10*3/uL (ref 145–400)
RBC: 3.2 MIL/uL — ABNORMAL LOW (ref 3.70–5.45)
RDW: 16.6 % — AB (ref 11.2–14.5)
WBC Count: 4.2 10*3/uL (ref 3.9–10.3)

## 2018-03-07 LAB — CMP (CANCER CENTER ONLY)
ALBUMIN: 3.2 g/dL — AB (ref 3.5–5.0)
ALT: 21 U/L (ref 0–44)
ANION GAP: 8 (ref 5–15)
AST: 19 U/L (ref 15–41)
Alkaline Phosphatase: 171 U/L — ABNORMAL HIGH (ref 38–126)
BUN: 9 mg/dL (ref 6–20)
CHLORIDE: 102 mmol/L (ref 98–111)
CO2: 26 mmol/L (ref 22–32)
Calcium: 9.3 mg/dL (ref 8.9–10.3)
Creatinine: 0.67 mg/dL (ref 0.44–1.00)
GFR, Est AFR Am: >60 mL/min (ref >60)
GFR, Estimated: >60 mL/min (ref >60)
GLUCOSE: 103 mg/dL — AB (ref 70–99)
POTASSIUM: 3.6 mmol/L (ref 3.5–5.1)
SODIUM: 136 mmol/L (ref 135–145)
Total Bilirubin: 0.3 mg/dL (ref 0.3–1.2)
Total Protein: 7.2 g/dL (ref 6.5–8.1)

## 2018-03-07 LAB — TOTAL PROTEIN, URINE DIPSTICK: Protein, ur: <30 mg/dL — AB

## 2018-03-07 MED ORDER — HEPARIN SOD (PORK) LOCK FLUSH 100 UNIT/ML IV SOLN
500.0000 [IU] | Freq: Once | INTRAVENOUS | Status: AC | PRN
  Administered 2018-03-07: 500 [IU]
  Filled 2018-03-07: qty 5

## 2018-03-07 MED ORDER — OLANZAPINE 10 MG PO TABS: 10.0000 mg | ORAL_TABLET | Freq: Every day | ORAL | 1 refills | Status: AC

## 2018-03-07 MED ORDER — SODIUM CHLORIDE 0.9% FLUSH
10.0000 mL | INTRAVENOUS | Status: DC | PRN
  Administered 2018-03-07: 10 mL
  Filled 2018-03-07: qty 10

## 2018-03-07 NOTE — Telephone Encounter (Signed)
Printed avs and calender of upcoming appointments. Per 7/15 los r/s 8/5 appointments for 8/7 annd 8/8. Gave patient CT instructions. Canceled weekly labs per requested.

## 2018-03-09 ENCOUNTER — Other Ambulatory Visit: Payer: Self-pay | Admitting: *Deleted

## 2018-03-09 NOTE — Progress Notes (Signed)
25 minutes discussing pt concerns. Pt concerned that at last visit on 7/15- pt was being set up for chemotherapy at scheduling however pt husband voiced pt is no longer receiving treatment. Pt waiting on call from scheduling for CT scan appt.  Request for refill on sleep , pain and n/v medication. Zyprexa sent to [facility] verified with pharmacist this rx was received and filled 7/15. Pt has not picked up. Temazepam also refilled per pharmacist pt unable to p/u until 1-2 days prior to 7/24  Reviewed with MD.Pain med rx refilled and  will need to be picked up at [facility].  Explained to husband It can take up to 5 business days for a CT Scan to be authorized by insurance and to expect a call by Friday 7/19.  Explained and reviewed above information with pt's husband more than one time. Husband verbalized understanding at 230pm. No further concerns.

## 2018-03-10 ENCOUNTER — Other Ambulatory Visit: Payer: Self-pay | Admitting: *Deleted

## 2018-03-10 DIAGNOSIS — C7931 Secondary malignant neoplasm of brain: Secondary | ICD-10-CM

## 2018-03-10 MED ORDER — HYDROCODONE-ACETAMINOPHEN 5-325 MG PO TABS: 1.0000 | ORAL_TABLET | Freq: Four times a day (QID) | ORAL | 0 refills | Status: DC | PRN

## 2018-03-10 NOTE — Progress Notes (Signed)
Orders only

## 2018-03-13 ENCOUNTER — Other Ambulatory Visit: Payer: Self-pay | Admitting: Internal Medicine

## 2018-03-13 DIAGNOSIS — C3490 Malignant neoplasm of unspecified part of unspecified bronchus or lung: Secondary | ICD-10-CM

## 2018-03-14 ENCOUNTER — Other Ambulatory Visit: Payer: 59

## 2018-03-21 ENCOUNTER — Inpatient Hospital Stay: Payer: 59

## 2018-03-21 ENCOUNTER — Other Ambulatory Visit: Payer: Self-pay | Admitting: *Deleted

## 2018-03-28 ENCOUNTER — Encounter (HOSPITAL_COMMUNITY): Payer: Self-pay

## 2018-03-28 ENCOUNTER — Ambulatory Visit: Payer: 59

## 2018-03-28 ENCOUNTER — Emergency Department (HOSPITAL_COMMUNITY)
Admission: EM | Admit: 2018-03-28 | Discharge: 2018-03-28 | Disposition: A | Discharge status: Home / Self Care | Payer: 59 | Attending: Emergency Medicine | Admitting: Emergency Medicine

## 2018-03-28 ENCOUNTER — Other Ambulatory Visit: Payer: 59

## 2018-03-28 ENCOUNTER — Ambulatory Visit (HOSPITAL_COMMUNITY)
Admission: RE | Admit: 2018-03-28 | Discharge: 2018-03-28 | Disposition: A | Discharge status: Home / Self Care | Payer: 59 | Source: Ambulatory Visit | Attending: Oncology | Admitting: Oncology

## 2018-03-28 ENCOUNTER — Other Ambulatory Visit: Payer: Self-pay

## 2018-03-28 ENCOUNTER — Ambulatory Visit: Payer: 59 | Admitting: Internal Medicine

## 2018-03-28 ENCOUNTER — Telehealth: Payer: Self-pay | Admitting: *Deleted

## 2018-03-28 ENCOUNTER — Emergency Department (HOSPITAL_COMMUNITY): Payer: 59

## 2018-03-28 DIAGNOSIS — J90 Pleural effusion, not elsewhere classified: Secondary | ICD-10-CM

## 2018-03-28 DIAGNOSIS — C7951 Secondary malignant neoplasm of bone: Secondary | ICD-10-CM

## 2018-03-28 DIAGNOSIS — R0602 Shortness of breath: Secondary | ICD-10-CM | POA: Insufficient documentation

## 2018-03-28 DIAGNOSIS — Z87891 Personal history of nicotine dependence: Secondary | ICD-10-CM | POA: Diagnosis not present

## 2018-03-28 DIAGNOSIS — C50919 Malignant neoplasm of unspecified site of unspecified female breast: Secondary | ICD-10-CM | POA: Diagnosis not present

## 2018-03-28 DIAGNOSIS — R0789 Other chest pain: Secondary | ICD-10-CM | POA: Insufficient documentation

## 2018-03-28 DIAGNOSIS — C7931 Secondary malignant neoplasm of brain: Secondary | ICD-10-CM | POA: Insufficient documentation

## 2018-03-28 DIAGNOSIS — C3492 Malignant neoplasm of unspecified part of left bronchus or lung: Secondary | ICD-10-CM

## 2018-03-28 DIAGNOSIS — C3490 Malignant neoplasm of unspecified part of unspecified bronchus or lung: Secondary | ICD-10-CM | POA: Diagnosis not present

## 2018-03-28 DIAGNOSIS — Z79899 Other long term (current) drug therapy: Secondary | ICD-10-CM | POA: Diagnosis not present

## 2018-03-28 DIAGNOSIS — R002 Palpitations: Secondary | ICD-10-CM | POA: Diagnosis not present

## 2018-03-28 DIAGNOSIS — C787 Secondary malignant neoplasm of liver and intrahepatic bile duct: Secondary | ICD-10-CM | POA: Insufficient documentation

## 2018-03-28 LAB — BASIC METABOLIC PANEL
Anion gap: 12 (ref 5–15)
BUN: 16 mg/dL (ref 6–20)
CALCIUM: 9 mg/dL (ref 8.9–10.3)
CHLORIDE: 103 mmol/L (ref 98–111)
CO2: 26 mmol/L (ref 22–32)
Creatinine, Ser: 0.4 mg/dL — ABNORMAL LOW (ref 0.44–1.00)
GFR calc Af Amer: >60 mL/min (ref >60)
Glucose, Bld: 114 mg/dL — ABNORMAL HIGH (ref 70–99)
Potassium: 3.5 mmol/L (ref 3.5–5.1)
SODIUM: 141 mmol/L (ref 135–145)

## 2018-03-28 LAB — I-STAT BETA HCG BLOOD, ED (MC, WL, AP ONLY)

## 2018-03-28 LAB — CBC
HEMATOCRIT: 38.4 % (ref 36.0–46.0)
HEMOGLOBIN: 12.7 g/dL (ref 12.0–15.0)
MCH: 34.8 pg — ABNORMAL HIGH (ref 26.0–34.0)
MCHC: 33.1 g/dL (ref 30.0–36.0)
MCV: 105.2 fL — ABNORMAL HIGH (ref 78.0–100.0)
Platelets: 170 10*3/uL (ref 150–400)
RBC: 3.65 MIL/uL — AB (ref 3.87–5.11)
RDW: 15.3 % (ref 11.5–15.5)
WBC: 11.5 10*3/uL — AB (ref 4.0–10.5)

## 2018-03-28 LAB — MAGNESIUM: Magnesium: 1.8 mg/dL (ref 1.7–2.4)

## 2018-03-28 LAB — I-STAT TROPONIN, ED: TROPONIN I, POC: 0.01 ng/mL (ref 0.00–0.08)

## 2018-03-28 MED ORDER — HEPARIN SOD (PORK) LOCK FLUSH 100 UNIT/ML IV SOLN
500.0000 [IU] | Freq: Once | INTRAVENOUS | Status: AC
Start: 2018-03-28 — End: 2018-03-28
  Administered 2018-03-28: 500 [IU]
  Filled 2018-03-28: qty 5

## 2018-03-28 MED ORDER — IOPAMIDOL (ISOVUE-300) INJECTION 61%
INTRAVENOUS | Status: AC
  Filled 2018-03-28: qty 100

## 2018-03-28 MED ORDER — POTASSIUM CHLORIDE CRYS ER 20 MEQ PO TBCR
20.0000 meq | EXTENDED_RELEASE_TABLET | Freq: Once | ORAL | Status: AC
  Administered 2018-03-28: 20 meq via ORAL
  Filled 2018-03-28: qty 1

## 2018-03-28 MED ORDER — ONDANSETRON HCL 4 MG/2ML IJ SOLN
4.0000 mg | Freq: Once | INTRAMUSCULAR | Status: AC
  Administered 2018-03-28: 4 mg via INTRAVENOUS
  Filled 2018-03-28: qty 2

## 2018-03-28 MED ORDER — IOPAMIDOL (ISOVUE-300) INJECTION 61%
75.0000 mL | Freq: Once | INTRAVENOUS | Status: AC | PRN
  Administered 2018-03-28: 75 mL via INTRAVENOUS

## 2018-03-28 MED ORDER — SODIUM CHLORIDE 0.9 % IV BOLUS
1000.0000 mL | Freq: Once | INTRAVENOUS | Status: AC
  Administered 2018-03-28: 1000 mL via INTRAVENOUS

## 2018-03-28 MED ORDER — HEPARIN SOD (PORK) LOCK FLUSH 100 UNIT/ML IV SOLN
INTRAVENOUS | Status: AC
  Filled 2018-03-28: qty 5

## 2018-03-28 MED ORDER — HEPARIN SOD (PORK) LOCK FLUSH 100 UNIT/ML IV SOLN
500.0000 [IU] | Freq: Once | INTRAVENOUS | Status: AC
  Administered 2018-03-28: 500 [IU] via INTRAVENOUS

## 2018-03-28 MED ORDER — POTASSIUM CHLORIDE 10 MEQ/100ML IV SOLN
10.0000 meq | Freq: Once | INTRAVENOUS | Status: AC
  Administered 2018-03-28: 10 meq via INTRAVENOUS
  Filled 2018-03-28: qty 100

## 2018-03-28 NOTE — Telephone Encounter (Signed)
Received call from pt's husband reporting that pt is c/o palpitations & have gotten worse over last week & increased pain in back, hips, head, chest & hydrocodone is not enough & she is taking it regularly every 6 hours .  He reports that she is asleep not on couch & breathing is like a panting dog.  Discussed with Diane RN & she suggested pt go to ED with new symptoms.  Informed husband & he states "I don't know if she will go but I will try to get her to."  Message to DR Mohamed/RN.

## 2018-03-28 NOTE — Discharge Instructions (Addendum)
Your evaluated in the emergency department for 1 week of heart palpitations.  You were on the monitor here and it did not show any serious heart rhythms.  Your lab work was also unremarkable.  You are feeling better after some fluid and potassium.  Will be important to follow-up with your doctor you may need to be set up with some cardiac Holter monitoring.  Please return if any worsening symptoms.

## 2018-03-28 NOTE — ED Provider Notes (Signed)
[facility] DEPT Provider Note   CSN: [id number] Arrival date & time: 03/28/18  1843     History   Chief Complaint Chief Complaint  Patient presents with  . Palpitations  . Shortness of Breath    HPI Diana Schwartz is a 52 y.o. female.  She presents to the ER with complaint of palpitations for 1 week.  She has metastatic cancer and has just recently discontinued chemotherapy due to its side effects.  She is had 1 week of very frequent palpitations that feel like a very hard hit in her chest that caused her to feel weak and nauseous and she is vomited.  No syncope no fever.  She was at the beach this last week and when she got back she called her oncologist Dr. Julien Nordmann who instructed her to come to the emergency department.  She states she is otherwise been taking her regular medications.  The history is provided by the patient.  Palpitations   This is a new problem. The current episode started more than 1 week ago. Episode frequency: frequently. The problem has not changed since onset.Associated symptoms include chest pain, nausea, vomiting, dizziness, weakness and shortness of breath. Pertinent negatives include no fever, no syncope, no abdominal pain and no cough. She has tried nothing for the symptoms. The treatment provided no relief.  Shortness of Breath  Associated symptoms include chest pain and vomiting. Pertinent negatives include no fever, no rhinorrhea, no sore throat, no neck pain, no cough, no syncope, no abdominal pain and no rash.    Past Medical History:  Diagnosis Date  . Anxiety   . Autoimmune hepatitis ([place])    no current med.  . Breast cancer [facility]) 2013   Left breast cancer. Treated at [facility]. Per notes, nvasive lobular carcinoma. S/P bilateral mastectomy with recontruction and Tamoxifen X5 years.   . Carpal tunnel syndrome of left wrist   . Degenerative disc disease, lumbar   . Depression   . Fibromyalgia   . Lupus (systemic  lupus erythematosus) ([place])   . Osteoarthritis   . Rash    arms, chest - due to lupus  . Raynaud disease   . Rheumatoid arthritis ([place])   . Sjoegren syndrome     Patient Active Problem List   Diagnosis Date Noted  . Port-A-Cath in place 11/29/2017  . Adenocarcinoma of lung, stage 4 ([place]) 11/10/2017  . Encounter for antineoplastic chemotherapy 11/10/2017  . Goals of care, counseling/discussion 11/10/2017  . Focal seizure ([place]) 11/02/2017  . Malnutrition of moderate degree 10/25/2017  . Brain metastases ([place]) 10/25/2017  . Brain edema ([place]) 10/23/2017  . Lung mass 10/22/2017  . Lupus ([place]) 10/21/2017  . Autoimmune hepatitis ([place]) 10/21/2017  . Breast cancer ([place]) 10/21/2017  . Depression 10/21/2017  . Anxiety 10/21/2017  . Fibromyalgia 10/21/2017  . Migraines 10/21/2017  . Lumbar spondylosis 05/30/2015    Past Surgical History:  Procedure Laterality Date  . ANTERIOR CERVICAL DECOMP/DISCECTOMY FUSION  02/06/2004   C5-6, C6-7  . AXILLARY NODE DISSECTION Left 08/2012  . BREAST RECONSTRUCTION  06/2013  . CARPAL TUNNEL RELEASE Right 1989  . CARPAL TUNNEL RELEASE Left 10/02/2013   Procedure: LEFT CARPAL TUNNEL RELEASE;  Surgeon: Linna Hoff, MD;  Location: [facility];  Service: Orthopedics;  Laterality: Left;  Local Anesthesia with IV Sedation  . ENDOBRONCHIAL ULTRASOUND Bilateral 10/27/2017   Procedure: ENDOBRONCHIAL ULTRASOUND;  Surgeon: Marshell Garfinkel, MD;  Location: [facility] END[facility];  Service: Cardiopulmonary;  Laterality: Bilateral;  .  FOOT SURGERY Right    bunion and broke toe and then realign toe  . IR FLUORO GUIDE PORT INSERTION RIGHT  10/25/2017  . IR US GUIDE VASC ACCESS RIGHT  10/25/2017  . MASTECTOMY Bilateral 08/12/2012   LEFT BREAST CANCER  . thoracic back surgery  05/2015  . VAGINAL HYSTERECTOMY  1999     OB History   None      Home Medications    Prior to Admission medications   Medication Sig Start Date End Date Taking? Authorizing Provider    benzonatate (TESSALON) 200 MG capsule TAKE 1 CAPSULE BY MOUTH EVERY DAY 3 TIMES A DAY AS NEEDED FOR COUGH 03/14/18  Yes Curt Bears, MD  dexamethasone (DECADRON) 2 MG tablet Take 1 tablet (2 mg total) by mouth 2 (two) times daily. Patient taking differently: Take 1 mg by mouth 2 (two) times daily.  12/27/17  Yes Tanner, Lyndon Code., PA-C  acetaminophen (TYLENOL) 500 MG tablet Take 1,000 mg by mouth every 6 (six) hours as needed.    [provider]  azaTHIOprine (IMURAN) 50 MG tablet Take 50 mg by mouth 2 (two) times daily.    [provider]  cetirizine (ZYRTEC) 10 MG tablet Take 10 mg by mouth daily.    [provider]  Cholecalciferol (HM VITAMIN D3) 4000 units CAPS Take by mouth.    [provider]  dexamethasone (DECADRON) 4 MG tablet Take 1 tablet twice a day the day before, day of, and day after each cycle of chemotherapy. Patient not taking: Reported on 01/31/2018 11/10/17   Maryanna Shape, NP  folic acid (FOLVITE) 1 MG tablet Take 1 tablet (1 mg total) by mouth daily. 01/31/18   Maryanna Shape, NP  HYDROcodone-acetaminophen (NORCO/VICODIN) 5-325 MG tablet Take 1 tablet by mouth every 6 (six) hours as needed for moderate pain. 03/10/18   Curt Bears, MD  hydroxychloroquine (PLAQUENIL) 200 MG tablet Take 200 mg by mouth 2 (two) times daily.    [provider]  levETIRAcetam (KEPPRA) 750 MG tablet Take 2 tablets (1,500 mg total) by mouth 2 (two) times daily. 02/28/18   Ventura Sellers, MD  lidocaine-prilocaine (EMLA) cream Apply 1 application topically as needed. 11/10/17   Maryanna Shape, NP  loratadine (CLARITIN) 10 MG tablet Take 10 mg by mouth daily. 02/27/18   [provider]  OLANZapine (ZYPREXA) 10 MG tablet Take 1 tablet (10 mg total) by mouth at bedtime. 03/07/18   Maryanna Shape, NP  omeprazole (PRILOSEC) 40 MG capsule Take by mouth daily. 11/05/17   [provider]  ondansetron (ZOFRAN ODT) 4 MG disintegrating  tablet Take 1 tablet (4 mg total) by mouth every 8 (eight) hours as needed for nausea or vomiting. 02/14/18   Curcio, Roselie Awkward, NP  prochlorperazine (COMPAZINE) 10 MG tablet Take 1 tablet (10 mg total) by mouth every 6 (six) hours as needed for nausea or vomiting. 02/28/18   Curt Bears, MD  senna-docusate (SENNA S) 8.6-50 MG tablet Take 1 tablet by mouth daily.    [provider]  temazepam (RESTORIL) 30 MG capsule TAKE 1 CAPSULE BY MOUTH AT BEDTIME AS NEEDED FOR SLEEP. DO NOT TAKE WITH XANAX (ALPRAZOLAM) 02/14/18   Maryanna Shape, NP  venlafaxine XR (EFFEXOR-XR) 150 MG 24 hr capsule Take 2 capsules by mouth daily with breakfast.  10/20/17   [provider]    Family History Family History  Problem Relation Age of Onset  . Lung cancer Mother   .  Hypertension Mother   . Lung cancer Father   . Rheum arthritis Father   . Hypertension Father   . Colon cancer Neg Hx     Social History Social History   Tobacco Use  . Smoking status: Former Smoker    Last attempt to quit: 05/24/2012    Years since quitting: 5.8  . Smokeless tobacco: Never Used  Substance Use Topics  . Alcohol use: Yes    Alcohol/week: 0.0 oz    Comment: socially  . Drug use: No     Allergies   Oxycodone; Doxycycline; and Prednisone   Review of Systems Review of Systems  Constitutional: Negative for chills and fever.  HENT: Negative for rhinorrhea and sore throat.   Eyes: Negative for visual disturbance.  Respiratory: Positive for shortness of breath. Negative for cough.   Cardiovascular: Positive for chest pain and palpitations. Negative for syncope.  Gastrointestinal: Positive for nausea and vomiting. Negative for abdominal pain.  Genitourinary: Negative for dysuria.  Musculoskeletal: Negative for neck pain.  Skin: Negative for rash.  Neurological: Positive for dizziness and weakness.     Physical Exam Updated Vital Signs BP 105/72 (BP Location: Right Arm)   Pulse (!) 125    Temp 98.8 F (37.1 C) (Oral)   Resp 19   Ht 5\' 2"  (1.575 m)   Wt 47.6 kg (105 lb)   SpO2 95%   BMI 19.20 kg/m   Physical Exam  Constitutional: She is oriented to person, place, and time. She appears well-developed and well-nourished. She appears cachectic.  HENT:  Head: Normocephalic and atraumatic.  Eyes: Conjunctivae are normal.  Neck: Neck supple.  Cardiovascular: Regular rhythm, normal heart sounds, intact distal pulses and normal pulses. Tachycardia present.  No murmur heard. Pulmonary/Chest: Effort normal and breath sounds normal. She has no wheezes. She has no rales.  Abdominal: Soft. There is no tenderness. There is no guarding.  Musculoskeletal: She exhibits no tenderness or deformity.  Neurological: She is alert and oriented to person, place, and time. GCS eye subscore is 4. GCS verbal subscore is 5. GCS motor subscore is 6.  Skin: Skin is warm and dry. Capillary refill takes less than 2 seconds.  Psychiatric: She has a normal mood and affect.     ED Treatments / Results  Labs (all labs ordered are listed, but only abnormal results are displayed) Labs Reviewed  BASIC METABOLIC PANEL - Abnormal; Notable for the following components:      Result Value   Glucose, Bld 114 (*)    Creatinine, Ser 0.40 (*)    All other components within normal limits  CBC - Abnormal; Notable for the following components:   WBC 11.5 (*)    RBC 3.65 (*)    MCV 105.2 (*)    MCH 34.8 (*)    All other components within normal limits  MAGNESIUM  I-STAT TROPONIN, ED  I-STAT BETA HCG BLOOD, ED ([facility], [facility], [facility] ONLY)    EKG None  Radiology Ct Chest W Contrast  Result Date: 03/28/2018 CLINICAL DATA:  Restaging lung cancer. EXAM: CT CHEST, ABDOMEN, AND PELVIS WITH CONTRAST TECHNIQUE: Multidetector CT imaging of the chest, abdomen and pelvis was performed following the standard protocol during bolus administration of intravenous contrast. CONTRAST:  99mL ISOVUE-300 IOPAMIDOL (ISOVUE-300)  INJECTION 61% COMPARISON:  CT scan 01/20/2018 and PET-CT 10/19/2017 FINDINGS: CT CHEST FINDINGS Cardiovascular: The heart is normal in size. No pericardial effusion. Stable mild tortuosity of the thoracic aorta. No dissection. Mediastinum/Nodes: 7 mm precarinal lymph  node on image number 24 previously measured 10 mm. 10 mm subcarinal lymph node on image number 36 previously measured 9 mm. 7 mm left hilar lymph node on image number 27 is stable. No new or progressive findings. Lungs/Pleura: Necrotic appearing mass in the left upper lobe but in the major fissure measures 3 x 2.5 cm and previously measured 3.8 x 2.8 cm. Moderate surrounding interstitial changes in the paramediastinal lung bilaterally suspicious for radiation change. No new pulmonary nodules to suggest metastatic disease. There is a small left pleural effusion but no enhancing pleural nodules are identified. Musculoskeletal: No chest wall mass, supraclavicular or axillary adenopathy. Small scattered lymph nodes are stable. The thyroid gland is grossly normal. The right-sided Port-A-Cath is stable. Stable scattered sclerotic bone lesions consistent with known metastatic disease. No new or progressive findings are identified. CT ABDOMEN PELVIS FINDINGS Hepatobiliary: New and enlarging hepatic lesions consistent with metastatic disease. Segment 4B lesion on image number 64 measures 12 mm. Other smaller lesions are difficult to measure but are definitely new since the prior study. Pancreas: No mass, inflammation or ductal dilatation. Spleen: Normal size.  No focal lesions. Adrenals/Urinary Tract: Stable left adrenal gland lesion measuring 11 mm. Stomach/Bowel: The stomach, duodenum, small bowel and colon are unremarkable. Vascular/Lymphatic: Stable atherosclerotic calcifications but no aneurysm or dissection. The branch vessels are patent. The major venous structures are patent. Slight enlargement of gastrohepatic ligament lymph nodes. The largest node  measures 11 mm on image number 53 and previously measured 7.5 mm. Other smaller and nodes are slightly larger also. Small celiac axis and periportal lymph nodes appears stable. Left para-aortic lymph node on image number 65 measures 7.5 mm and previously measured 3 mm. No pelvic adenopathy. Reproductive: The uterus is surgically absent. Both ovaries are still present and appear normal. Other: No inguinal mass or adenopathy. Musculoskeletal: Stable scattered sclerotic bone lesions most notably involving L5, the sacrum and the left acetabulum. No pathologic fracture. No new findings. IMPRESSION: 1. Slight interval decrease in size of the necrotic appearing left upper lobe lung mass. Stable small mediastinal and hilar lymph nodes. No new or progressive findings. No findings suspicious for new pulmonary metastatic disease. 2. Small left pleural effusion but no pleural nodules. 3. New/progressive hepatic metastatic disease. 4. Slight interval enlargement of gastrohepatic ligament and left retroperitoneal nodes. 5. Stable sclerotic metastatic bone lesions. No new or progressive bony findings. Electronically Signed   By: Marijo Sanes M.D.   On: 03/28/2018 13:32   Ct Abdomen Pelvis W Contrast  Result Date: 03/28/2018 CLINICAL DATA:  Restaging lung cancer. EXAM: CT CHEST, ABDOMEN, AND PELVIS WITH CONTRAST TECHNIQUE: Multidetector CT imaging of the chest, abdomen and pelvis was performed following the standard protocol during bolus administration of intravenous contrast. CONTRAST:  89mL ISOVUE-300 IOPAMIDOL (ISOVUE-300) INJECTION 61% COMPARISON:  CT scan 01/20/2018 and PET-CT 10/19/2017 FINDINGS: CT CHEST FINDINGS Cardiovascular: The heart is normal in size. No pericardial effusion. Stable mild tortuosity of the thoracic aorta. No dissection. Mediastinum/Nodes: 7 mm precarinal lymph node on image number 24 previously measured 10 mm. 10 mm subcarinal lymph node on image number 36 previously measured 9 mm. 7 mm left hilar  lymph node on image number 27 is stable. No new or progressive findings. Lungs/Pleura: Necrotic appearing mass in the left upper lobe but in the major fissure measures 3 x 2.5 cm and previously measured 3.8 x 2.8 cm. Moderate surrounding interstitial changes in the paramediastinal lung bilaterally suspicious for radiation change. No new pulmonary nodules to suggest  metastatic disease. There is a small left pleural effusion but no enhancing pleural nodules are identified. Musculoskeletal: No chest wall mass, supraclavicular or axillary adenopathy. Small scattered lymph nodes are stable. The thyroid gland is grossly normal. The right-sided Port-A-Cath is stable. Stable scattered sclerotic bone lesions consistent with known metastatic disease. No new or progressive findings are identified. CT ABDOMEN PELVIS FINDINGS Hepatobiliary: New and enlarging hepatic lesions consistent with metastatic disease. Segment 4B lesion on image number 64 measures 12 mm. Other smaller lesions are difficult to measure but are definitely new since the prior study. Pancreas: No mass, inflammation or ductal dilatation. Spleen: Normal size.  No focal lesions. Adrenals/Urinary Tract: Stable left adrenal gland lesion measuring 11 mm. Stomach/Bowel: The stomach, duodenum, small bowel and colon are unremarkable. Vascular/Lymphatic: Stable atherosclerotic calcifications but no aneurysm or dissection. The branch vessels are patent. The major venous structures are patent. Slight enlargement of gastrohepatic ligament lymph nodes. The largest node measures 11 mm on image number 53 and previously measured 7.5 mm. Other smaller and nodes are slightly larger also. Small celiac axis and periportal lymph nodes appears stable. Left para-aortic lymph node on image number 65 measures 7.5 mm and previously measured 3 mm. No pelvic adenopathy. Reproductive: The uterus is surgically absent. Both ovaries are still present and appear normal. Other: No inguinal mass  or adenopathy. Musculoskeletal: Stable scattered sclerotic bone lesions most notably involving L5, the sacrum and the left acetabulum. No pathologic fracture. No new findings. IMPRESSION: 1. Slight interval decrease in size of the necrotic appearing left upper lobe lung mass. Stable small mediastinal and hilar lymph nodes. No new or progressive findings. No findings suspicious for new pulmonary metastatic disease. 2. Small left pleural effusion but no pleural nodules. 3. New/progressive hepatic metastatic disease. 4. Slight interval enlargement of gastrohepatic ligament and left retroperitoneal nodes. 5. Stable sclerotic metastatic bone lesions. No new or progressive bony findings. Electronically Signed   By: Marijo Sanes M.D.   On: 03/28/2018 13:32    Procedures Procedures (including critical care time)  Medications Ordered in ED Medications  sodium chloride 0.9 % bolus 1,000 mL (0 mLs Intravenous Stopped 03/28/18 2229)  potassium chloride 10 mEq in 100 mL IVPB (0 mEq Intravenous Stopped 03/28/18 2229)  potassium chloride SA (K-DUR,KLOR-CON) CR tablet 20 mEq (20 mEq Oral Given 03/28/18 2131)  ondansetron (ZOFRAN) injection 4 mg (4 mg Intravenous Given 03/28/18 2123)  heparin lock flush 100 unit/mL (500 Units Intracatheter Given 03/28/18 2322)     Initial Impression / Assessment and Plan / ED Course  I have reviewed the triage vital signs and the nursing notes.  Pertinent labs & imaging results that were available during my care of the patient were reviewed by me and considered in my medical decision making (see chart for details).  Clinical Course as of Mar 29 1920  Molli Knock Mar 28, 2018  2039 Reevaluated patient.  She states she is feeling a little bit better since the IV fluids have been going.  Still waiting on her chemistries and mag because I think that there may be an answer and therefore she is having this feeling.  Reviewed her telemetry and I do not see any obvious dysrhythmias.   [MB]  2148  Patient's potassium is normal along with her magnesium but I felt she could benefit from some IV and p.o. potassium as it may decrease her heart irritability.  She ultimately wants to go home which I think is reasonable as I find no emergent medical condition  that she should be admitted to the hospital for.   [MB]    Clinical Course User Index [MB] Hayden Rasmussen, MD   ecg - sinus tach, 105, nonspecific st/ts, no pvcs  Final Clinical Impressions(s) / ED Diagnoses   Final diagnoses:  Heart palpitations    ED Discharge Orders    None       Hayden Rasmussen, MD 03/29/18 1924

## 2018-03-28 NOTE — ED Triage Notes (Signed)
Pt c/o palpitations and SOB x 1 week and bilateral hip and back pain x 3 weeks.  Pain score 6/10.  Last chemo on 7/1.  Pt reports [facility].  Hx of breast and lung CA w/ mets to brain.

## 2018-03-30 ENCOUNTER — Telehealth: Payer: Self-pay | Admitting: Medical Oncology

## 2018-03-30 ENCOUNTER — Encounter: Payer: Self-pay | Admitting: Internal Medicine

## 2018-03-30 ENCOUNTER — Inpatient Hospital Stay: Payer: 59 | Attending: Oncology

## 2018-03-30 ENCOUNTER — Inpatient Hospital Stay: Payer: 59

## 2018-03-30 ENCOUNTER — Inpatient Hospital Stay: Payer: 59 | Admitting: Nutrition

## 2018-03-30 ENCOUNTER — Inpatient Hospital Stay (HOSPITAL_BASED_OUTPATIENT_CLINIC_OR_DEPARTMENT_OTHER): Payer: 59 | Admitting: Internal Medicine

## 2018-03-30 VITALS — BP 113/94 | HR 105 | Temp 98.5°F | Resp 17 | Ht 62.0 in | Wt 104.1 lb

## 2018-03-30 DIAGNOSIS — C7951 Secondary malignant neoplasm of bone: Secondary | ICD-10-CM

## 2018-03-30 DIAGNOSIS — C342 Malignant neoplasm of middle lobe, bronchus or lung: Secondary | ICD-10-CM | POA: Diagnosis not present

## 2018-03-30 DIAGNOSIS — C7970 Secondary malignant neoplasm of unspecified adrenal gland: Secondary | ICD-10-CM

## 2018-03-30 DIAGNOSIS — C7931 Secondary malignant neoplasm of brain: Secondary | ICD-10-CM

## 2018-03-30 DIAGNOSIS — M329 Systemic lupus erythematosus, unspecified: Secondary | ICD-10-CM

## 2018-03-30 DIAGNOSIS — Z5111 Encounter for antineoplastic chemotherapy: Secondary | ICD-10-CM

## 2018-03-30 DIAGNOSIS — K754 Autoimmune hepatitis: Secondary | ICD-10-CM | POA: Diagnosis not present

## 2018-03-30 DIAGNOSIS — Z923 Personal history of irradiation: Secondary | ICD-10-CM | POA: Diagnosis not present

## 2018-03-30 DIAGNOSIS — Z9221 Personal history of antineoplastic chemotherapy: Secondary | ICD-10-CM | POA: Diagnosis not present

## 2018-03-30 DIAGNOSIS — C3490 Malignant neoplasm of unspecified part of unspecified bronchus or lung: Secondary | ICD-10-CM

## 2018-03-30 DIAGNOSIS — Z95828 Presence of other vascular implants and grafts: Secondary | ICD-10-CM

## 2018-03-30 DIAGNOSIS — Z7189 Other specified counseling: Secondary | ICD-10-CM

## 2018-03-30 DIAGNOSIS — M797 Fibromyalgia: Secondary | ICD-10-CM

## 2018-03-30 DIAGNOSIS — IMO0002 Reserved for concepts with insufficient information to code with codable children: Secondary | ICD-10-CM

## 2018-03-30 LAB — CBC WITH DIFFERENTIAL (CANCER CENTER ONLY)
Basophils Absolute: 0.1 10*3/uL (ref 0.0–0.1)
Basophils Relative: 1 %
EOS ABS: 1.4 10*3/uL — AB (ref 0.0–0.5)
Eosinophils Relative: 11 %
HEMATOCRIT: 37.2 % (ref 34.8–46.6)
Hemoglobin: 12.1 g/dL (ref 11.6–15.9)
Lymphocytes Relative: 4 %
Lymphs Abs: 0.5 10*3/uL — ABNORMAL LOW (ref 0.9–3.3)
MCH: 34.7 pg — ABNORMAL HIGH (ref 25.1–34.0)
MCHC: 32.5 g/dL (ref 31.5–36.0)
MCV: 106.6 fL — ABNORMAL HIGH (ref 79.5–101.0)
MONO ABS: 1.5 10*3/uL — AB (ref 0.1–0.9)
MONOS PCT: 12 %
NEUTROS ABS: 8.9 10*3/uL — AB (ref 1.5–6.5)
Neutrophils Relative %: 72 %
Platelet Count: 151 10*3/uL (ref 145–400)
RBC: 3.49 MIL/uL — ABNORMAL LOW (ref 3.70–5.45)
RDW: 15.3 % — AB (ref 11.2–14.5)
WBC Count: 12.4 10*3/uL — ABNORMAL HIGH (ref 3.9–10.3)

## 2018-03-30 LAB — CMP (CANCER CENTER ONLY)
ALBUMIN: 2.7 g/dL — AB (ref 3.5–5.0)
ALT: 24 U/L (ref 0–44)
ANION GAP: 14 (ref 5–15)
AST: 20 U/L (ref 15–41)
Alkaline Phosphatase: 418 U/L — ABNORMAL HIGH (ref 38–126)
BILIRUBIN TOTAL: 0.4 mg/dL (ref 0.3–1.2)
BUN: 10 mg/dL (ref 6–20)
CO2: 23 mmol/L (ref 22–32)
Calcium: 9.2 mg/dL (ref 8.9–10.3)
Chloride: 103 mmol/L (ref 98–111)
Creatinine: 0.57 mg/dL (ref 0.44–1.00)
GFR, Est AFR Am: >60 mL/min (ref >60)
GFR, Estimated: >60 mL/min (ref >60)
GLUCOSE: 97 mg/dL (ref 70–99)
POTASSIUM: 3.4 mmol/L — AB (ref 3.5–5.1)
Sodium: 140 mmol/L (ref 135–145)
TOTAL PROTEIN: 7.1 g/dL (ref 6.5–8.1)

## 2018-03-30 MED ORDER — HYDROCODONE-ACETAMINOPHEN 10-325 MG PO TABS: 1.0000 | ORAL_TABLET | Freq: Four times a day (QID) | ORAL | 0 refills | Status: AC | PRN

## 2018-03-30 MED ORDER — SODIUM CHLORIDE 0.9% FLUSH
10.0000 mL | INTRAVENOUS | Status: DC | PRN
  Administered 2018-03-30: 10 mL
  Filled 2018-03-30: qty 10

## 2018-03-30 MED ORDER — LIDOCAINE 5 % EX PTCH: 1.0000 | MEDICATED_PATCH | CUTANEOUS | 0 refills | Status: AC

## 2018-03-30 MED ORDER — HEPARIN SOD (PORK) LOCK FLUSH 100 UNIT/ML IV SOLN
500.0000 [IU] | Freq: Once | INTRAVENOUS | Status: AC | PRN
  Administered 2018-03-30: 500 [IU]
  Filled 2018-03-30: qty 5

## 2018-03-30 NOTE — Progress Notes (Signed)
[facility] [phone]) [phone]   [phone]) [phone]  OFFICE PROGRESS NOTE  [name], MD [place] [address]  DIAGNOSIS: Stage IV non-small cell lung cancer, adenocarcinoma presenting with a left middle lobe mass that abuts the mediastinal border/pericardium and the pleural surface. The patient was found to have adrenal and bone metastasis andover 20 brain metastases.  PD-L1 50%  PRIOR THERAPY:  1) Whole brain radiation and palliative radiation to the left lung on 11/10/2017. 2) Systemic chemotherapy with carboplatin for an AUC of 5, Alimta 500 mg/m2, and Avastin 15 mg/kg every 3 weeks. First dose expected on 11/22/2017.  Status post 5 cycles.  CURRENT THERAPY:   hospice and palliative care.    INTERVAL HISTORY: Diana Schwartz 52 y.o. female returns to the clinic today for follow-up visit accompanied by her husband and daughter.  The patient is feeling a little bit better today but continues to have pain in the left hip area.  She denied having any chest pain, shortness breath, cough or hemoptysis.  She denied having any fever or chills.  She was seen recently at the emergency department with questionable palpitation and she had cardiac work-up that was unremarkable.  She denied having any recent weight loss or night sweats.  She has no nausea, vomiting, diarrhea or constipation.  She had repeat CT scan of the chest, abdomen and pelvis performed recently and she is here for evaluation and discussion of her risk her results.  MEDICAL HISTORY: Past Medical History:  Diagnosis Date  . Anxiety   . Autoimmune hepatitis ([place])    no current med.  . Breast cancer [facility]) 2013   Left breast cancer. Treated at [facility]. Per notes, nvasive lobular carcinoma. S/P bilateral mastectomy with recontruction and Tamoxifen X5 years.   . Carpal tunnel syndrome of left wrist   . Degenerative disc disease, lumbar   . Depression   . Fibromyalgia   .  Lupus (systemic lupus erythematosus) ([place])   . Osteoarthritis   . Rash    arms, chest - due to lupus  . Raynaud disease   . Rheumatoid arthritis ([place])   . Sjoegren syndrome     ALLERGIES:  is allergic to oxycodone; doxycycline; and prednisone.  MEDICATIONS:  Current Outpatient Medications  Medication Sig Dispense Refill  . azaTHIOprine (IMURAN) 50 MG tablet Take 50 mg by mouth 2 (two) times daily.    . benzonatate (TESSALON) 200 MG capsule TAKE 1 CAPSULE BY MOUTH EVERY DAY 3 TIMES A DAY AS NEEDED FOR COUGH 30 capsule 0  . bisacodyl (DULCOLAX) 5 MG EC tablet Take 5 mg by mouth daily as needed for moderate constipation.    . Cholecalciferol (HM VITAMIN D3) 4000 units CAPS Take by mouth.    . dexamethasone (DECADRON) 2 MG tablet Take 1 tablet (2 mg total) by mouth 2 (two) times daily. (Patient taking differently: Take 1 mg by mouth 2 (two) times daily. ) 60 tablet 0  . dexamethasone (DECADRON) 4 MG tablet Take 1 tablet twice a day the day before, day of, and day after each cycle of chemotherapy. (Patient not taking: Reported on 01/31/2018) 30 tablet 1  . diclofenac sodium (VOLTAREN) 1 % GEL APPLY 2GRAMS TO THE AFFECTED AREA 3 TIMES A DAY  1  . folic acid (FOLVITE) 1 MG tablet Take 1 tablet (1 mg total) by mouth daily. 30 tablet 2  . HYDROcodone-acetaminophen (NORCO/VICODIN) 5-325 MG tablet Take 1 tablet by mouth  every 6 (six) hours as needed for moderate pain. 30 tablet 0  . hydroxychloroquine (PLAQUENIL) 200 MG tablet Take 200 mg by mouth 2 (two) times daily.    Marland Kitchen ibuprofen (ADVIL,MOTRIN) 200 MG tablet Take 200 mg by mouth every 6 (six) hours as needed.    . levETIRAcetam (KEPPRA) 750 MG tablet Take 2 tablets (1,500 mg total) by mouth 2 (two) times daily. 120 tablet 1  . lidocaine-prilocaine (EMLA) cream Apply 1 application topically as needed. 30 g 0  . magnesium hydroxide (MILK OF MAGNESIA) 400 MG/5ML suspension Take 30 mLs by mouth daily as needed for mild constipation.    . Menthol,  Topical Analgesic, (BIOFREEZE EX) Apply 1 application topically daily as needed (pain).    Marland Kitchen OLANZapine (ZYPREXA) 10 MG tablet Take 1 tablet (10 mg total) by mouth at bedtime. 30 tablet 1  . omeprazole (PRILOSEC) 40 MG capsule Take by mouth daily.  3  . ondansetron (ZOFRAN ODT) 4 MG disintegrating tablet Take 1 tablet (4 mg total) by mouth every 8 (eight) hours as needed for nausea or vomiting. 20 tablet 1  . polyethylene glycol (MIRALAX / GLYCOLAX) packet Take 17 g by mouth daily as needed for moderate constipation.    . prochlorperazine (COMPAZINE) 10 MG tablet Take 1 tablet (10 mg total) by mouth every 6 (six) hours as needed for nausea or vomiting. 30 tablet 1  . senna-docusate (SENNA S) 8.6-50 MG tablet Take 1 tablet by mouth 2 (two) times daily.     . temazepam (RESTORIL) 30 MG capsule TAKE 1 CAPSULE BY MOUTH AT BEDTIME AS NEEDED FOR SLEEP. DO NOT TAKE WITH XANAX (ALPRAZOLAM) 30 capsule 0  . venlafaxine XR (EFFEXOR-XR) 150 MG 24 hr capsule Take 2 capsules by mouth daily with breakfast.      No current facility-administered medications for this visit.    Facility-Administered Medications Ordered in Other Visits  Medication Dose Route Frequency Provider Last Rate Last Dose  . sodium chloride flush (NS) 0.9 % injection 10 mL  10 mL Intracatheter PRN Curt Bears, MD   10 mL at 03/30/18 7591    SURGICAL HISTORY:  Past Surgical History:  Procedure Laterality Date  . ANTERIOR CERVICAL DECOMP/DISCECTOMY FUSION  02/06/2004   C5-6, C6-7  . AXILLARY NODE DISSECTION Left 08/2012  . BREAST RECONSTRUCTION  06/2013  . CARPAL TUNNEL RELEASE Right 1989  . CARPAL TUNNEL RELEASE Left 10/02/2013   Procedure: LEFT CARPAL TUNNEL RELEASE;  Surgeon: Linna Hoff, MD;  Location: [facility];  Service: Orthopedics;  Laterality: Left;  Local Anesthesia with IV Sedation  . ENDOBRONCHIAL ULTRASOUND Bilateral 10/27/2017   Procedure: ENDOBRONCHIAL ULTRASOUND;  Surgeon: Marshell Garfinkel, MD;   Location: [facility];  Service: Cardiopulmonary;  Laterality: Bilateral;  . FOOT SURGERY Right    bunion and broke toe and then realign toe  . IR FLUORO GUIDE PORT INSERTION RIGHT  10/25/2017  . IR US GUIDE VASC ACCESS RIGHT  10/25/2017  . MASTECTOMY Bilateral 08/12/2012   LEFT BREAST CANCER  . thoracic back surgery  05/2015  . VAGINAL HYSTERECTOMY  1999    REVIEW OF SYSTEMS:  Constitutional: positive for fatigue Eyes: negative Ears, nose, mouth, throat, and face: negative Respiratory: negative Cardiovascular: positive for palpitations Gastrointestinal: positive for nausea Genitourinary:negative Integument/breast: negative Hematologic/lymphatic: negative Musculoskeletal:positive for bone pain Neurological: negative Behavioral/Psych: negative Endocrine: negative Allergic/Immunologic: negative   PHYSICAL EXAMINATION: General appearance: alert, cooperative, fatigued and no distress Head: Normocephalic, without obvious abnormality, atraumatic Neck: no adenopathy, no JVD,  supple, symmetrical, trachea midline and thyroid not enlarged, symmetric, no tenderness/mass/nodules Lymph nodes: Cervical, supraclavicular, and axillary nodes normal. Resp: clear to auscultation bilaterally Back: symmetric, no curvature. ROM normal. No CVA tenderness. Cardio: regular rate and rhythm, S1, S2 normal, no murmur, click, rub or gallop GI: soft, non-tender; bowel sounds normal; no masses,  no organomegaly Extremities: extremities normal, atraumatic, no cyanosis or edema Neurologic: Alert and oriented X 3, normal strength and tone. Normal symmetric reflexes. Normal coordination and gait  ECOG PERFORMANCE STATUS: 1 - Symptomatic but completely ambulatory  Blood pressure (!) 113/94, pulse (!) 105, temperature 98.5 F (36.9 C), temperature source Oral, resp. rate 17, height 5' 2"  (1.575 m), weight 104 lb 1.6 oz (47.2 kg), SpO2 96 %.  LABORATORY DATA: Lab Results  Component Value Date   WBC 11.5 (H)  03/28/2018   HGB 12.7 03/28/2018   HCT 38.4 03/28/2018   MCV 105.2 (H) 03/28/2018   PLT 170 03/28/2018      Chemistry      Component Value Date/Time   NA 141 03/28/2018 1910   K 3.5 03/28/2018 1910   CL 103 03/28/2018 1910   CO2 26 03/28/2018 1910   BUN 16 03/28/2018 1910   CREATININE 0.40 (L) 03/28/2018 1910   CREATININE 0.67 03/07/2018 0915      Component Value Date/Time   CALCIUM 9.0 03/28/2018 1910   ALKPHOS 171 (H) 03/07/2018 0915   AST 19 03/07/2018 0915   ALT 21 03/07/2018 0915   BILITOT 0.3 03/07/2018 0915       RADIOGRAPHIC STUDIES: Dg Chest 2 View  Result Date: 03/28/2018 CLINICAL DATA:  52 year old female with history of breast cancer and lung cancer with metastatic disease. Shortness of breath for 1 week. EXAM: CHEST - 2 VIEW COMPARISON:  Chest x-ray 10/27/2017.  Chest CT 03/28/2018. FINDINGS: Treated left upper lobe mass again noted with surrounding architectural distortion, better demonstrated on today's contemporaneously obtained chest CT. Small left pleural effusion. Right lung is clear. No right pleural effusion. No evidence of pulmonary edema. Heart size is normal. Upper mediastinal contours are within normal limits. Right internal jugular single-lumen porta cath with tip terminating at the superior cavoatrial junction. IMPRESSION: 1. Known left upper lobe mass with evolving post treatment changes, better demonstrated on today's chest CT. 2. Small left pleural effusion. Electronically Signed   By: Vinnie Langton M.D.   On: 03/28/2018 19:46   Ct Chest W Contrast  Result Date: 03/28/2018 CLINICAL DATA:  Restaging lung cancer. EXAM: CT CHEST, ABDOMEN, AND PELVIS WITH CONTRAST TECHNIQUE: Multidetector CT imaging of the chest, abdomen and pelvis was performed following the standard protocol during bolus administration of intravenous contrast. CONTRAST:  63m ISOVUE-300 IOPAMIDOL (ISOVUE-300) INJECTION 61% COMPARISON:  CT scan 01/20/2018 and PET-CT 10/19/2017 FINDINGS:  CT CHEST FINDINGS Cardiovascular: The heart is normal in size. No pericardial effusion. Stable mild tortuosity of the thoracic aorta. No dissection. Mediastinum/Nodes: 7 mm precarinal lymph node on image number 24 previously measured 10 mm. 10 mm subcarinal lymph node on image number 36 previously measured 9 mm. 7 mm left hilar lymph node on image number 27 is stable. No new or progressive findings. Lungs/Pleura: Necrotic appearing mass in the left upper lobe but in the major fissure measures 3 x 2.5 cm and previously measured 3.8 x 2.8 cm. Moderate surrounding interstitial changes in the paramediastinal lung bilaterally suspicious for radiation change. No new pulmonary nodules to suggest metastatic disease. There is a small left pleural effusion but no enhancing pleural nodules  are identified. Musculoskeletal: No chest wall mass, supraclavicular or axillary adenopathy. Small scattered lymph nodes are stable. The thyroid gland is grossly normal. The right-sided Port-A-Cath is stable. Stable scattered sclerotic bone lesions consistent with known metastatic disease. No new or progressive findings are identified. CT ABDOMEN PELVIS FINDINGS Hepatobiliary: New and enlarging hepatic lesions consistent with metastatic disease. Segment 4B lesion on image number 64 measures 12 mm. Other smaller lesions are difficult to measure but are definitely new since the prior study. Pancreas: No mass, inflammation or ductal dilatation. Spleen: Normal size.  No focal lesions. Adrenals/Urinary Tract: Stable left adrenal gland lesion measuring 11 mm. Stomach/Bowel: The stomach, duodenum, small bowel and colon are unremarkable. Vascular/Lymphatic: Stable atherosclerotic calcifications but no aneurysm or dissection. The branch vessels are patent. The major venous structures are patent. Slight enlargement of gastrohepatic ligament lymph nodes. The largest node measures 11 mm on image number 53 and previously measured 7.5 mm. Other smaller  and nodes are slightly larger also. Small celiac axis and periportal lymph nodes appears stable. Left para-aortic lymph node on image number 65 measures 7.5 mm and previously measured 3 mm. No pelvic adenopathy. Reproductive: The uterus is surgically absent. Both ovaries are still present and appear normal. Other: No inguinal mass or adenopathy. Musculoskeletal: Stable scattered sclerotic bone lesions most notably involving L5, the sacrum and the left acetabulum. No pathologic fracture. No new findings. IMPRESSION: 1. Slight interval decrease in size of the necrotic appearing left upper lobe lung mass. Stable small mediastinal and hilar lymph nodes. No new or progressive findings. No findings suspicious for new pulmonary metastatic disease. 2. Small left pleural effusion but no pleural nodules. 3. New/progressive hepatic metastatic disease. 4. Slight interval enlargement of gastrohepatic ligament and left retroperitoneal nodes. 5. Stable sclerotic metastatic bone lesions. No new or progressive bony findings. Electronically Signed   By: Marijo Sanes M.D.   On: 03/28/2018 13:32   Ct Abdomen Pelvis W Contrast  Result Date: 03/28/2018 CLINICAL DATA:  Restaging lung cancer. EXAM: CT CHEST, ABDOMEN, AND PELVIS WITH CONTRAST TECHNIQUE: Multidetector CT imaging of the chest, abdomen and pelvis was performed following the standard protocol during bolus administration of intravenous contrast. CONTRAST:  68m ISOVUE-300 IOPAMIDOL (ISOVUE-300) INJECTION 61% COMPARISON:  CT scan 01/20/2018 and PET-CT 10/19/2017 FINDINGS: CT CHEST FINDINGS Cardiovascular: The heart is normal in size. No pericardial effusion. Stable mild tortuosity of the thoracic aorta. No dissection. Mediastinum/Nodes: 7 mm precarinal lymph node on image number 24 previously measured 10 mm. 10 mm subcarinal lymph node on image number 36 previously measured 9 mm. 7 mm left hilar lymph node on image number 27 is stable. No new or progressive findings.  Lungs/Pleura: Necrotic appearing mass in the left upper lobe but in the major fissure measures 3 x 2.5 cm and previously measured 3.8 x 2.8 cm. Moderate surrounding interstitial changes in the paramediastinal lung bilaterally suspicious for radiation change. No new pulmonary nodules to suggest metastatic disease. There is a small left pleural effusion but no enhancing pleural nodules are identified. Musculoskeletal: No chest wall mass, supraclavicular or axillary adenopathy. Small scattered lymph nodes are stable. The thyroid gland is grossly normal. The right-sided Port-A-Cath is stable. Stable scattered sclerotic bone lesions consistent with known metastatic disease. No new or progressive findings are identified. CT ABDOMEN PELVIS FINDINGS Hepatobiliary: New and enlarging hepatic lesions consistent with metastatic disease. Segment 4B lesion on image number 64 measures 12 mm. Other smaller lesions are difficult to measure but are definitely new since the prior study.  Pancreas: No mass, inflammation or ductal dilatation. Spleen: Normal size.  No focal lesions. Adrenals/Urinary Tract: Stable left adrenal gland lesion measuring 11 mm. Stomach/Bowel: The stomach, duodenum, small bowel and colon are unremarkable. Vascular/Lymphatic: Stable atherosclerotic calcifications but no aneurysm or dissection. The branch vessels are patent. The major venous structures are patent. Slight enlargement of gastrohepatic ligament lymph nodes. The largest node measures 11 mm on image number 53 and previously measured 7.5 mm. Other smaller and nodes are slightly larger also. Small celiac axis and periportal lymph nodes appears stable. Left para-aortic lymph node on image number 65 measures 7.5 mm and previously measured 3 mm. No pelvic adenopathy. Reproductive: The uterus is surgically absent. Both ovaries are still present and appear normal. Other: No inguinal mass or adenopathy. Musculoskeletal: Stable scattered sclerotic bone lesions  most notably involving L5, the sacrum and the left acetabulum. No pathologic fracture. No new findings. IMPRESSION: 1. Slight interval decrease in size of the necrotic appearing left upper lobe lung mass. Stable small mediastinal and hilar lymph nodes. No new or progressive findings. No findings suspicious for new pulmonary metastatic disease. 2. Small left pleural effusion but no pleural nodules. 3. New/progressive hepatic metastatic disease. 4. Slight interval enlargement of gastrohepatic ligament and left retroperitoneal nodes. 5. Stable sclerotic metastatic bone lesions. No new or progressive bony findings. Electronically Signed   By: Marijo Sanes M.D.   On: 03/28/2018 13:32    ASSESSMENT AND PLAN: This is a very pleasant 52 years old white female with metastatic non-small cell lung cancer, adenocarcinoma with no actionable mutations.  PDL 1 expression is 50% but the patient has significant lupus.  She is status post whole brain irradiation.  She is currently on treatment with systemic chemotherapy with carboplatin, Alimta and Avastin status post 5 cycles. The patient has a rough time with her treatment.  She decided to discontinue her systemic chemotherapy after cycle #5. Repeat CT scan of the chest, abdomen and pelvis showed evidence for disease progression in the liver but there was improvement in the left upper lobe lung mass. I personally and independently reviewed the scan images and discussed the results with the patient and her family.  I gave the patient the option of relative care and hospice referral versus consideration of second line treatment with chemotherapy with docetaxel and Cyramza. The patient declined to proceed with any further chemotherapy and she would like to consider palliative care and hospice at this point.  I will refer her to the palliative care and hospice service of [facility]. For pain management I will refer the patient to Dr. Tammi Klippel for consideration of palliative  radiotherapy to the metastatic bone lesions in the left hip area.  I also increased her pain medication to hydrocodone 10/325 mg as needed.  She will also use ibuprofen as needed.  I also started the patient on lidocaine patch to be applied to the painful area. The patient and her family had several questions and I answered them completely to their satisfaction. The patient is not a candidate for treatment with immunotherapy because of her rheumatological disorders including lupus as well as autoimmune hepatitis. She will come back for follow-up visit on as-needed basis. She was advised to call if she has any concerning symptoms.  The patient voices understanding of current disease status and treatment options and is in agreement with the current care plan.  All questions were answered. The patient knows to call the clinic with any problems, questions or concerns. We can certainly see  the patient much sooner if necessary. I spent 25 minutes counseling the patient face to face. The total time spent in the appointment was 30 minutes.  Disclaimer: This note was dictated with voice recognition software. Similar sounding words can inadvertently be transcribed and may not be corrected upon review.

## 2018-03-30 NOTE — Telephone Encounter (Signed)
Called hospice referral.

## 2018-03-30 NOTE — Telephone Encounter (Signed)
Changed level of  service to palliative care as pt receiving palliative radiation.

## 2018-03-31 ENCOUNTER — Telehealth: Payer: Self-pay | Admitting: Urology

## 2018-03-31 ENCOUNTER — Telehealth: Payer: Self-pay

## 2018-03-31 ENCOUNTER — Inpatient Hospital Stay: Payer: 59

## 2018-03-31 ENCOUNTER — Telehealth: Payer: Self-pay | Admitting: Medical Oncology

## 2018-03-31 ENCOUNTER — Ambulatory Visit: Admission: RE | Admit: 2018-03-31 | Payer: 59 | Source: Ambulatory Visit | Admitting: Radiation Oncology

## 2018-03-31 NOTE — Telephone Encounter (Signed)
Received call from patient's daughter, Joellen [place], who shared that patient has opted to pursue hospice care instead of palliative

## 2018-03-31 NOTE — Telephone Encounter (Signed)
Phone call placed to patient to offer to schedule visit with Palliative care. VM left

## 2018-03-31 NOTE — Progress Notes (Signed)
PA for Lidocaine 5% patches submitted.

## 2018-03-31 NOTE — Telephone Encounter (Signed)
VM left for patient's daughter, Joellen [place], to offer to schedule visit with Palliative Care

## 2018-03-31 NOTE — Telephone Encounter (Signed)
Ms. Reinaldo Berber spoke with the patient's spouse, Legrand Como, this morning to offer an appointment this afternoon at 12:30 PM to get the patient set up for palliative radiotherapy to the left hip however, Legrand Como indicated that they had decided not to pursue palliative radiotherapy and instead wished to proceed with hospice care only.   Nicholos Johns, PA-C

## 2018-03-31 NOTE — Telephone Encounter (Signed)
Pt declined Radiation for palliation and going back to Hospice services

## 2018-04-04 ENCOUNTER — Inpatient Hospital Stay: Payer: 59

## 2018-04-04 NOTE — Progress Notes (Signed)
Lidocaine 5% EX Patch has been approved from 03/01/2018 - 03/30/2021.

## 2018-04-06 ENCOUNTER — Institutional Professional Consult (permissible substitution): Payer: Self-pay | Admitting: Urology

## 2018-04-12 ENCOUNTER — Telehealth: Payer: Self-pay | Admitting: Medical Oncology

## 2018-04-12 NOTE — Telephone Encounter (Signed)
Pt died  at [facility] place

## 2018-04-24 DEATH — deceased

## 2018-04-29 ENCOUNTER — Other Ambulatory Visit: Payer: 59

## 2018-05-03 ENCOUNTER — Ambulatory Visit: Payer: 59 | Admitting: Internal Medicine

## 2019-06-26 IMAGING — CT CT CHEST W/O CM
1 series · 14 of 34 positions shown, 18 images · non-contrast
Comparison: Chest x-ray 10/06/2017 history of left breast cancer.

ADDENDUM:
These results will be called to the ordering clinician or
representative by the Radiologist Assistant, and communication
documented in the PACS or zVision Dashboard.
CLINICAL DATA: Evaluate pneumonia post multiple course of
antibiotics.

EXAM:
CT CHEST WITHOUT CONTRAST
TECHNIQUE: Multidetector CT imaging of the chest was performed following the
standard protocol without IV contrast.

[Series 2: chest w/(date) · axial · 0.72mm/px · z∈[-274,+2]mm · 14 of 163 slices shown, 18 images]
[im 13/163  mediastinal]
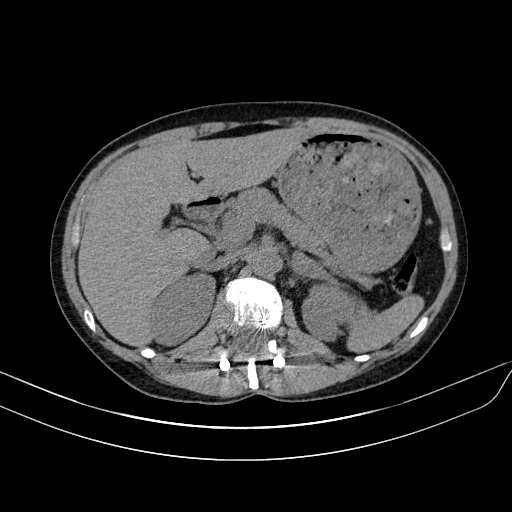
[im 13/163  lung]
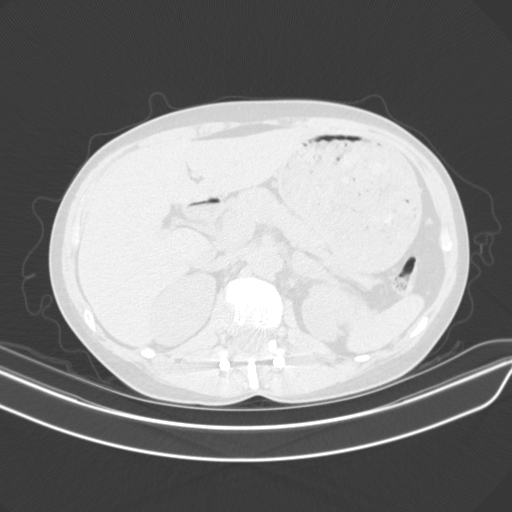
[im 25/163  lung]
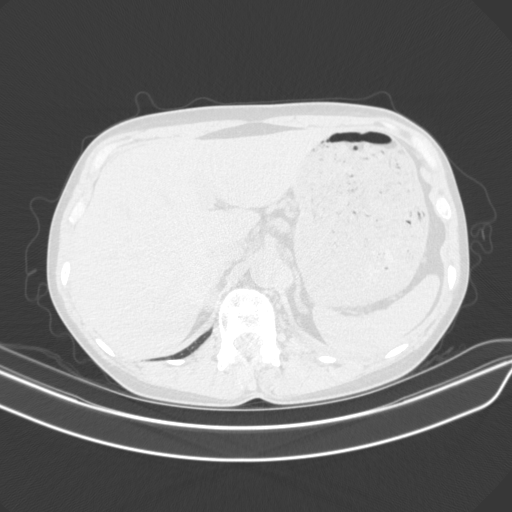
[im 33/163  lung]
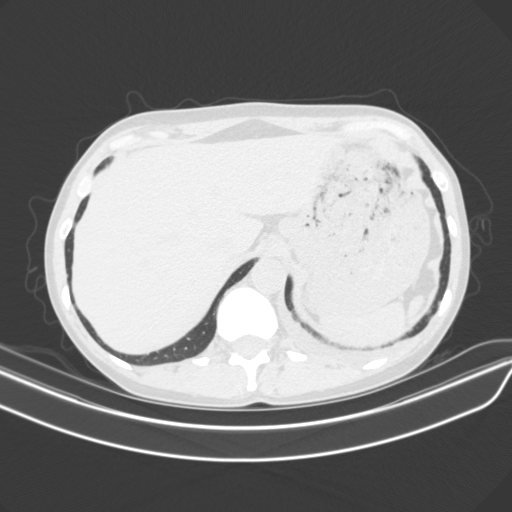
[im 49/163  lung]
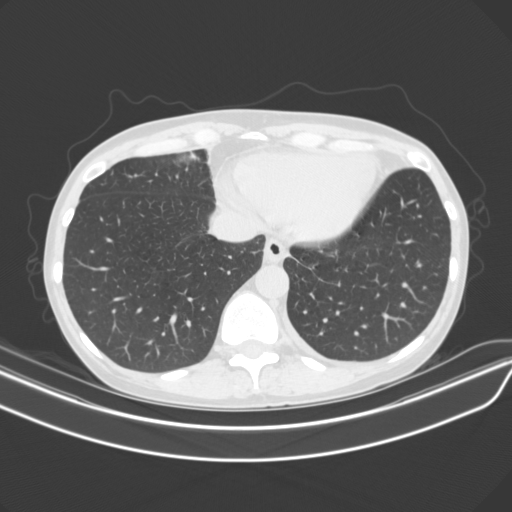
[im 61/163  mediastinal]
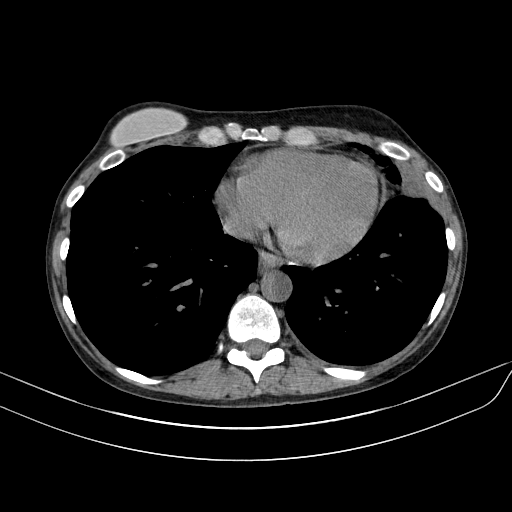
[im 61/163  lung]
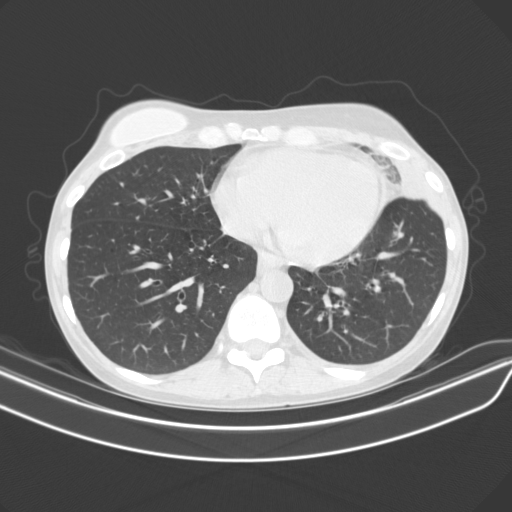
[im 67/163  lung]
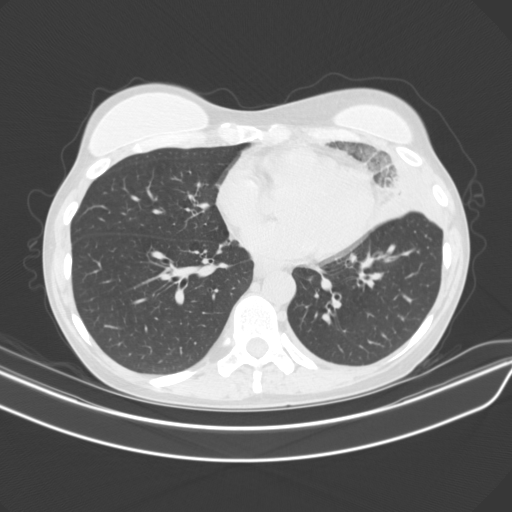
[im 77/163  lung]
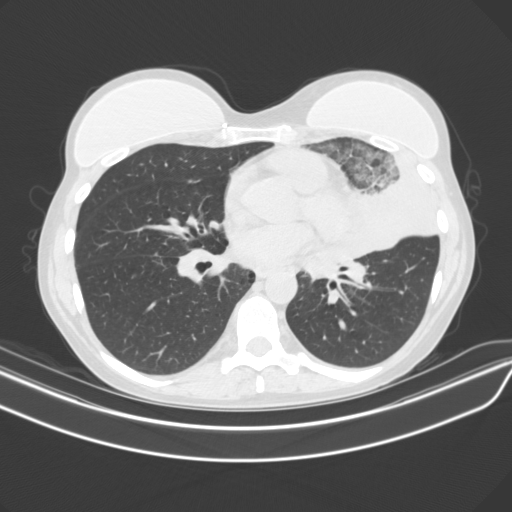
[im 87/163  lung]
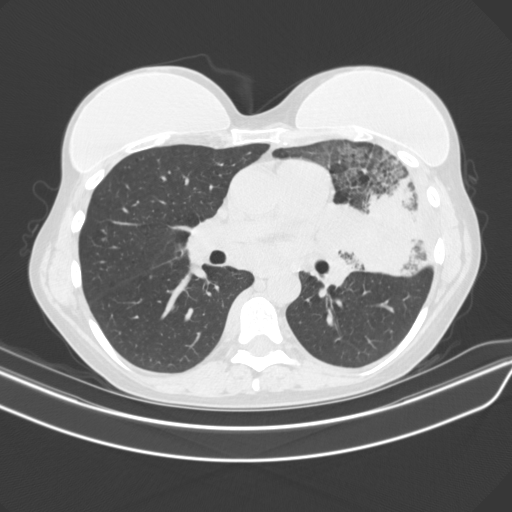
[im 97/163  mediastinal]
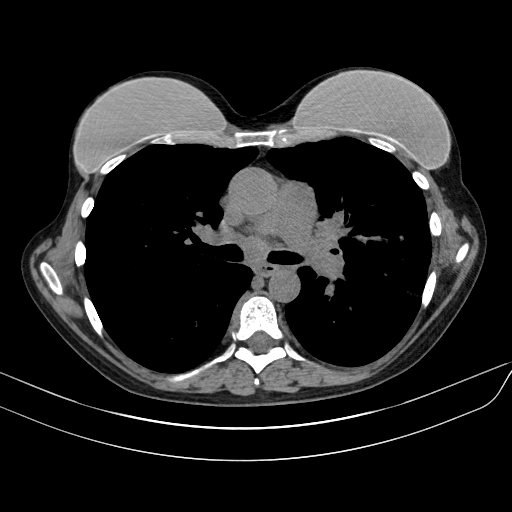
[im 97/163  lung]
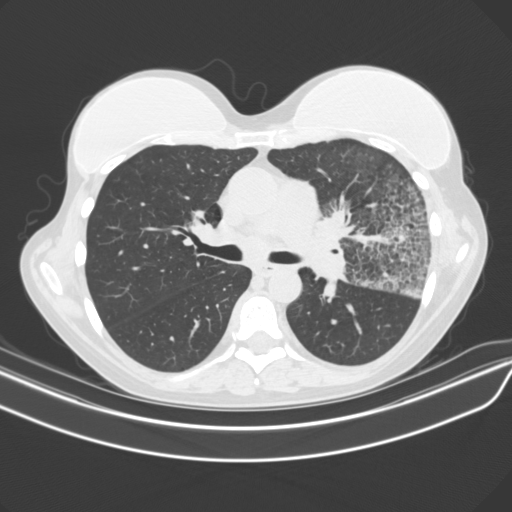
[im 103/163  lung]
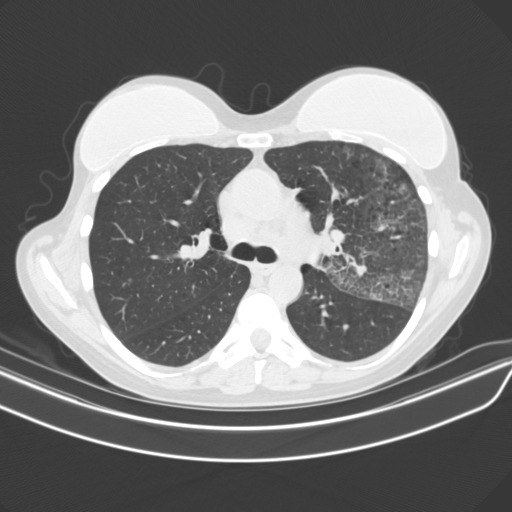
[im 121/163  lung]
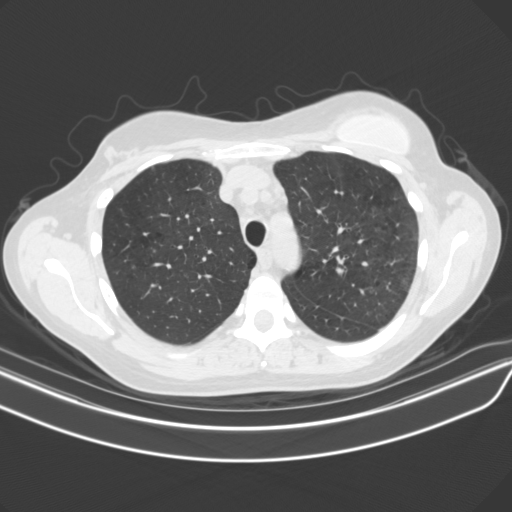
[im 130/163  lung]
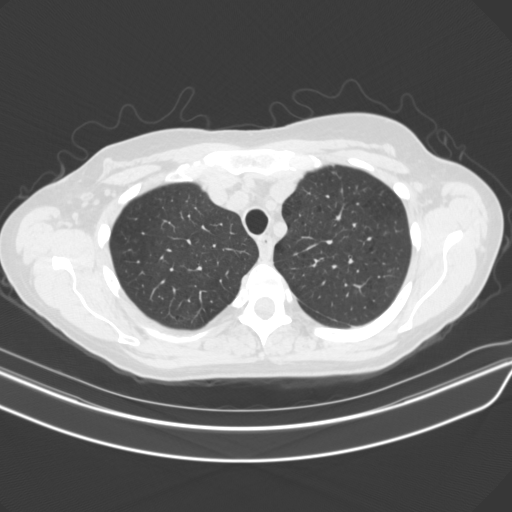
[im 139/163  mediastinal]
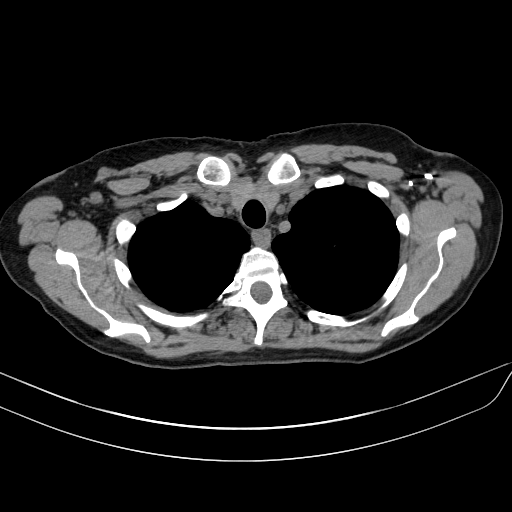
[im 139/163  lung]
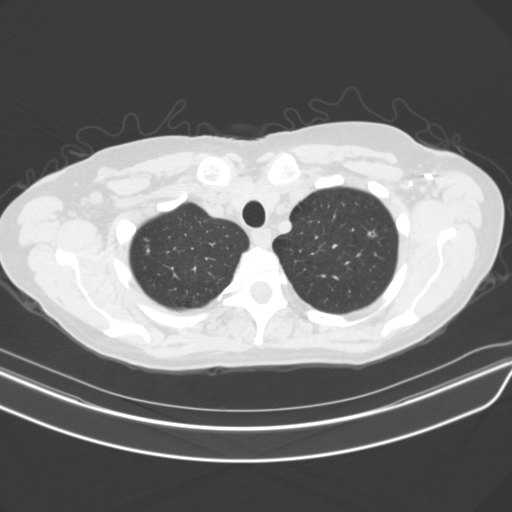
[im 151/163  lung]
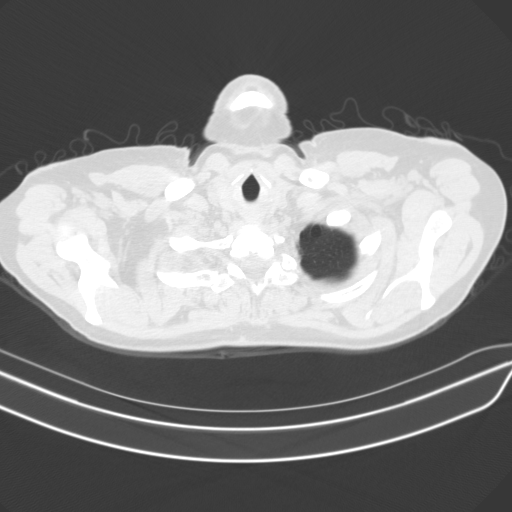

[14 of 34 positions shown; findings below may reference images not displayed]

FINDINGS: Cardiovascular: No significant vascular findings. Normal heart size.
No pericardial effusion.

Mediastinum/Nodes: Mediastinal lymphadenopathy with lymph nodes
measuring up to 19 mm in short axis. Bulky left hilar
lymphadenopathy. Normal trachea and esophagus. 1.5 cm hypoattenuated
mass within the right lobe of the thyroid gland.

Lungs/Pleura: Mild upper lobe predominant emphysematous changes.
Bilobed low-attenuation soft tissue mass in the lingula measures
by 5.6 by 5.5 cm and abuts the pericardium and anterior mediastinum
medially and the pleural surface laterally. There is probable
extension along the pleural surface. Surrounding interstitial
thickening suspicious for lymphangitic spread of tumor. Small left
pleural effusion. Several less than 5 mm pulmonary nodules in the
right upper lobe.

Upper Abdomen: No acute abnormality.

Musculoskeletal: Lower thoracic and lower cervical fusion with
intact hardware. No evidence of suspicious lesions. Post bilateral
mastectomy and implant reconstruction. Post left axillary lymph node
dissection.
IMPRESSION: Left middle lobe heterogeneous possibly necrotic soft tissue mass,
measuring up to 8.4 cm, highly suspicious for malignancy. The mass
abuts the mediastinal border/pericardium medially and the pleural
surface laterally. Suspected pleural extension of tumor, as well as
lymphangitic spread of malignancy in the rest of the lung parenchyma
of the lingula and portions of the left upper lobe. Tissue diagnosis
and PET-CT may be considered.

Bulky left hilar lymphadenopathy and mediastinal lymphadenopathy.

Few less than 5 mm solid and ground-glass nodules in the right upper
lobe with indeterminate significance.

1.5 cm right thyroid mass.

Post bilateral mastectomy with implant reconstruction.

Post left axillary lymph node dissection.. Determined left axillary
lymph nodes, which could be further evaluated with focused axillary
ultrasound.

Emphysema ([id number]).
